# Patient Record
Sex: Male | Born: 1960 | Race: White | Hispanic: No | Marital: Married | Smoking: Never smoker
Health system: Southern US, Community
[De-identification: ages and names within clinical notes are randomized; demographics above are authoritative.]

## PROBLEM LIST (undated history)

## (undated) DIAGNOSIS — G4733 Obstructive sleep apnea (adult) (pediatric): Secondary | ICD-10-CM

## (undated) DIAGNOSIS — K219 Gastro-esophageal reflux disease without esophagitis: Secondary | ICD-10-CM

## (undated) DIAGNOSIS — N200 Calculus of kidney: Secondary | ICD-10-CM

## (undated) DIAGNOSIS — G459 Transient cerebral ischemic attack, unspecified: Secondary | ICD-10-CM

## (undated) DIAGNOSIS — B360 Pityriasis versicolor: Secondary | ICD-10-CM

## (undated) DIAGNOSIS — I10 Essential (primary) hypertension: Secondary | ICD-10-CM

## (undated) DIAGNOSIS — I639 Cerebral infarction, unspecified: Secondary | ICD-10-CM

## (undated) HISTORY — PX: CYSTECTOMY: SUR359

## (undated) HISTORY — DX: Transient cerebral ischemic attack, unspecified: G45.9

## (undated) HISTORY — DX: Cerebral infarction, unspecified: I63.9

## (undated) HISTORY — DX: Pityriasis versicolor: B36.0

## (undated) HISTORY — PX: COSMETIC SURGERY: SHX468

## (undated) HISTORY — PX: REFRACTIVE SURGERY: SHX103

## (undated) HISTORY — PX: WISDOM TOOTH EXTRACTION: SHX21

## (undated) HISTORY — PX: OTHER SURGICAL HISTORY: SHX169

## (undated) HISTORY — DX: Essential (primary) hypertension: I10

---

## 1984-08-02 ENCOUNTER — Emergency Department: Admit: 1984-08-02 | Disposition: A | Discharge status: Home / Self Care | Payer: Self-pay | Source: Ambulatory Visit

## 1987-03-04 ENCOUNTER — Emergency Department: Admit: 1987-03-04 | Payer: Self-pay | Source: Ambulatory Visit

## 1987-09-06 HISTORY — PX: COLONOSCOPY: SHX174

## 1987-09-06 HISTORY — PX: UPPER GI ENDOSCOPY: SHX6162

## 1987-10-17 ENCOUNTER — Emergency Department: Admit: 1987-10-17 | Payer: Self-pay | Source: Ambulatory Visit

## 1990-07-04 ENCOUNTER — Emergency Department: Admit: 1990-07-04 | Disposition: A | Discharge status: Home / Self Care | Payer: Self-pay | Source: Ambulatory Visit

## 1995-01-09 ENCOUNTER — Ambulatory Visit: Admit: 1995-01-09 | Disposition: A | Discharge status: Home / Self Care | Payer: Self-pay | Source: Ambulatory Visit

## 1995-08-28 ENCOUNTER — Emergency Department: Admit: 1995-08-28 | Disposition: A | Discharge status: Home / Self Care | Payer: Self-pay | Source: Ambulatory Visit

## 1998-10-14 ENCOUNTER — Ambulatory Visit (INDEPENDENT_AMBULATORY_CARE_PROVIDER_SITE_OTHER): Admit: 1998-10-14 | Disposition: A | Discharge status: Home / Self Care | Payer: Self-pay | Source: Ambulatory Visit

## 1999-07-04 ENCOUNTER — Emergency Department: Admit: 1999-07-04 | Disposition: A | Discharge status: Home / Self Care | Payer: Self-pay | Source: Ambulatory Visit

## 2001-12-07 ENCOUNTER — Ambulatory Visit (INDEPENDENT_AMBULATORY_CARE_PROVIDER_SITE_OTHER): Admit: 2001-12-07 | Disposition: A | Discharge status: Home / Self Care | Payer: Self-pay | Source: Ambulatory Visit

## 2002-02-11 ENCOUNTER — Ambulatory Visit (INDEPENDENT_AMBULATORY_CARE_PROVIDER_SITE_OTHER): Admit: 2002-02-11 | Disposition: A | Discharge status: Home / Self Care | Payer: Self-pay | Source: Ambulatory Visit

## 2002-12-16 ENCOUNTER — Ambulatory Visit (INDEPENDENT_AMBULATORY_CARE_PROVIDER_SITE_OTHER): Admit: 2002-12-16 | Disposition: A | Discharge status: Home / Self Care | Payer: Self-pay | Source: Ambulatory Visit

## 2004-11-23 ENCOUNTER — Ambulatory Visit: Payer: Self-pay | Admitting: Internal Medicine

## 2004-11-30 ENCOUNTER — Ambulatory Visit: Payer: Self-pay | Admitting: Internal Medicine

## 2005-09-19 ENCOUNTER — Ambulatory Visit: Payer: Self-pay | Admitting: Internal Medicine

## 2005-09-21 ENCOUNTER — Ambulatory Visit
Admission: RE | Admit: 2005-09-21 | Disposition: A | Discharge status: Home / Self Care | Payer: Self-pay | Source: Ambulatory Visit

## 2005-10-11 ENCOUNTER — Ambulatory Visit
Admission: RE | Admit: 2005-10-11 | Disposition: A | Discharge status: Home / Self Care | Payer: Self-pay | Source: Ambulatory Visit

## 2006-05-28 ENCOUNTER — Emergency Department
Admission: EM | Admit: 2006-05-28 | Disposition: A | Discharge status: Home / Self Care | Payer: Self-pay | Source: Ambulatory Visit

## 2006-07-03 ENCOUNTER — Ambulatory Visit
Admission: RE | Admit: 2006-07-03 | Disposition: A | Discharge status: Home / Self Care | Payer: Self-pay | Source: Ambulatory Visit

## 2006-07-25 ENCOUNTER — Ambulatory Visit
Admission: RE | Admit: 2006-07-25 | Disposition: A | Discharge status: Home / Self Care | Payer: Self-pay | Source: Ambulatory Visit

## 2007-03-13 ENCOUNTER — Ambulatory Visit: Payer: Self-pay | Admitting: Internal Medicine

## 2007-09-06 DIAGNOSIS — I639 Cerebral infarction, unspecified: Secondary | ICD-10-CM

## 2007-09-06 HISTORY — DX: Cerebral infarction, unspecified: I63.9

## 2007-10-10 ENCOUNTER — Ambulatory Visit: Payer: Self-pay | Admitting: Internal Medicine

## 2007-10-10 DIAGNOSIS — K209 Esophagitis, unspecified without bleeding: Secondary | ICD-10-CM | POA: Insufficient documentation

## 2007-10-10 DIAGNOSIS — R1319 Other dysphagia: Secondary | ICD-10-CM | POA: Insufficient documentation

## 2007-10-10 LAB — CONVERTED CEMR LAB
Bilirubin Urine: NEGATIVE
Blood in Urine, dipstick: NEGATIVE
Glucose, Urine, Semiquant: NEGATIVE
Ketones, urine, test strip: NEGATIVE
Protein, U semiquant: NEGATIVE
Specific Gravity, Urine: 1.01
WBC Urine, dipstick: NEGATIVE
pH: 7

## 2007-10-19 ENCOUNTER — Encounter (INDEPENDENT_AMBULATORY_CARE_PROVIDER_SITE_OTHER): Payer: Self-pay | Admitting: *Deleted

## 2007-10-19 LAB — CONVERTED CEMR LAB
AST: 30 units/L (ref 0–37)
Albumin: 4.1 g/dL (ref 3.5–5.2)
Alkaline Phosphatase: 53 units/L (ref 39–117)
Basophils Relative: 0.2 % (ref 0.0–1.0)
Eosinophils Relative: 2.8 % (ref 0.0–5.0)
H Pylori IgG: NEGATIVE
Hemoglobin: 16.9 g/dL (ref 13.0–17.0)
Lymphocytes Relative: 26.3 % (ref 12.0–46.0)
Monocytes Absolute: 0.7 10*3/uL (ref 0.2–0.7)
Monocytes Relative: 9.5 % (ref 3.0–11.0)
Neutro Abs: 4.4 10*3/uL (ref 1.4–7.7)
RDW: 11.9 % (ref 11.5–14.6)
Total Bilirubin: 0.8 mg/dL (ref 0.3–1.2)
Total Protein: 7.3 g/dL (ref 6.0–8.3)
WBC: 7.2 10*3/uL (ref 4.5–10.5)

## 2007-11-16 ENCOUNTER — Inpatient Hospital Stay (HOSPITAL_COMMUNITY): Admission: EM | Admit: 2007-11-16 | Discharge: 2007-11-17 | Payer: Self-pay | Admitting: Emergency Medicine

## 2007-11-16 ENCOUNTER — Encounter (INDEPENDENT_AMBULATORY_CARE_PROVIDER_SITE_OTHER): Payer: Self-pay | Admitting: Neurology

## 2007-11-16 ENCOUNTER — Ambulatory Visit: Payer: Self-pay | Admitting: Internal Medicine

## 2007-11-20 ENCOUNTER — Telehealth: Payer: Self-pay | Admitting: Internal Medicine

## 2007-12-25 ENCOUNTER — Encounter: Payer: Self-pay | Admitting: Internal Medicine

## 2008-01-29 ENCOUNTER — Telehealth (INDEPENDENT_AMBULATORY_CARE_PROVIDER_SITE_OTHER): Payer: Self-pay | Admitting: *Deleted

## 2008-12-23 ENCOUNTER — Ambulatory Visit: Payer: Self-pay | Admitting: Internal Medicine

## 2008-12-23 DIAGNOSIS — Z8673 Personal history of transient ischemic attack (TIA), and cerebral infarction without residual deficits: Secondary | ICD-10-CM | POA: Insufficient documentation

## 2008-12-25 ENCOUNTER — Telehealth: Payer: Self-pay | Admitting: Internal Medicine

## 2009-01-29 ENCOUNTER — Telehealth: Payer: Self-pay | Admitting: Internal Medicine

## 2009-02-17 ENCOUNTER — Ambulatory Visit: Admit: 2009-02-17 | Disposition: A | Discharge status: Home / Self Care | Payer: Self-pay | Source: Ambulatory Visit

## 2009-02-17 ENCOUNTER — Ambulatory Visit
Admission: RE | Admit: 2009-02-17 | Disposition: A | Discharge status: Home / Self Care | Payer: Self-pay | Source: Ambulatory Visit

## 2009-07-13 ENCOUNTER — Ambulatory Visit: Payer: Self-pay | Admitting: Internal Medicine

## 2009-07-13 DIAGNOSIS — F411 Generalized anxiety disorder: Secondary | ICD-10-CM | POA: Insufficient documentation

## 2009-07-17 ENCOUNTER — Encounter (INDEPENDENT_AMBULATORY_CARE_PROVIDER_SITE_OTHER): Payer: Self-pay | Admitting: *Deleted

## 2009-07-20 ENCOUNTER — Ambulatory Visit: Payer: Self-pay | Admitting: Internal Medicine

## 2009-08-13 ENCOUNTER — Ambulatory Visit: Payer: Self-pay | Admitting: Internal Medicine

## 2009-09-11 ENCOUNTER — Ambulatory Visit: Payer: Self-pay | Admitting: Gastroenterology

## 2009-09-17 ENCOUNTER — Telehealth (INDEPENDENT_AMBULATORY_CARE_PROVIDER_SITE_OTHER): Payer: Self-pay | Admitting: *Deleted

## 2009-10-02 ENCOUNTER — Ambulatory Visit: Payer: Self-pay | Admitting: Internal Medicine

## 2009-10-26 ENCOUNTER — Telehealth: Payer: Self-pay | Admitting: Internal Medicine

## 2009-11-23 ENCOUNTER — Telehealth: Payer: Self-pay | Admitting: Internal Medicine

## 2009-11-26 ENCOUNTER — Ambulatory Visit: Payer: Self-pay | Admitting: Internal Medicine

## 2009-11-26 DIAGNOSIS — E1159 Type 2 diabetes mellitus with other circulatory complications: Secondary | ICD-10-CM

## 2009-11-26 DIAGNOSIS — I10 Essential (primary) hypertension: Secondary | ICD-10-CM

## 2009-11-26 DIAGNOSIS — I152 Hypertension secondary to endocrine disorders: Secondary | ICD-10-CM | POA: Insufficient documentation

## 2009-11-27 ENCOUNTER — Encounter (INDEPENDENT_AMBULATORY_CARE_PROVIDER_SITE_OTHER): Payer: Self-pay | Admitting: *Deleted

## 2009-11-30 LAB — CONVERTED CEMR LAB
BUN: 12 mg/dL (ref 6–23)
Creatinine, Ser: 0.9 mg/dL (ref 0.4–1.5)
Potassium: 4 meq/L (ref 3.5–5.1)

## 2009-12-21 ENCOUNTER — Ambulatory Visit: Payer: Self-pay

## 2009-12-21 ENCOUNTER — Ambulatory Visit: Payer: Self-pay | Admitting: Cardiology

## 2009-12-31 ENCOUNTER — Ambulatory Visit: Payer: Self-pay | Admitting: Family Medicine

## 2009-12-31 DIAGNOSIS — B360 Pityriasis versicolor: Secondary | ICD-10-CM | POA: Insufficient documentation

## 2010-01-15 ENCOUNTER — Ambulatory Visit
Admission: RE | Admit: 2010-01-15 | Disposition: A | Discharge status: Home / Self Care | Payer: Self-pay | Source: Ambulatory Visit

## 2010-01-26 ENCOUNTER — Telehealth (INDEPENDENT_AMBULATORY_CARE_PROVIDER_SITE_OTHER): Payer: Self-pay | Admitting: *Deleted

## 2010-05-03 ENCOUNTER — Telehealth (INDEPENDENT_AMBULATORY_CARE_PROVIDER_SITE_OTHER): Payer: Self-pay | Admitting: *Deleted

## 2010-06-21 ENCOUNTER — Ambulatory Visit: Payer: Self-pay | Admitting: Internal Medicine

## 2010-06-21 DIAGNOSIS — R0989 Other specified symptoms and signs involving the circulatory and respiratory systems: Secondary | ICD-10-CM

## 2010-06-21 DIAGNOSIS — R0609 Other forms of dyspnea: Secondary | ICD-10-CM | POA: Insufficient documentation

## 2010-08-11 ENCOUNTER — Telehealth: Payer: Self-pay | Admitting: Internal Medicine

## 2010-10-03 LAB — CONVERTED CEMR LAB
ALT: 42 units/L (ref 0–53)
AST: 32 units/L (ref 0–37)
BUN: 10 mg/dL (ref 6–23)
Sed Rate: 4 mm/hr (ref 0–22)
TSH: 3.17 microintl units/mL (ref 0.35–5.50)

## 2010-10-05 NOTE — Progress Notes (Signed)
Summary: MED REFILL  Phone Note Call from Patient Call back at Work Phone [phone]   Caller: Patient Call For: Marga Melnick MD Reason for Call: Refill Medication Summary of Call: PATIENT REQUESTING REFILL OF FLUOXETINE 10MG , 30 DAY SUPPLY, BUT WOULD LIKE 90 SUPPLY IF POSSIBLE.  CURRENTLY NO REFILLS LEFT.  [facility] [address].  LAST OV 10-02-2009, I DO NOT SEE ANY DOCUMENTATION OF THIS MEDICATION IN EMR.  PT STATES DR. Devontre Siedschlag PRESCRIBED THIS MED. Initial call taken by: Magdalen Spatz [facility],  October 26, 2009 2:42 PM  Follow-up for Phone Call        Dr.Mozell Hardacre please advise, not on medication list Follow-up by: Shonna Chock,  October 26, 2009 3:13 PM  Additional Follow-up for Phone Call Additional follow up Details #1::        Fluoxetine 10 mg #90 Additional Follow-up by: Marga Melnick MD,  October 26, 2009 4:08 PM    Additional Follow-up for Phone Call Additional follow up Details #2::    left pt detail message rx sent to pharmacy...........Marland KitchenFelecia Deloach CMA  October 26, 2009 4:23 PM   New/Updated Medications: FLUOXETINE HCL 10 MG TABS (FLUOXETINE HCL) take 1 tab once daily Prescriptions: FLUOXETINE HCL 10 MG TABS (FLUOXETINE HCL) take 1 tab once daily  #90 x 0   Entered by:   Jeremy Johann CMA   Authorized by:   Marga Melnick MD   Signed by:   Jeremy Johann CMA on 10/26/2009   Method used:   Faxed to ...       [facility]       [address]       [place], [place]  [address]       Ph: [phone]       Fax: [phone]   RxID:   [id number]

## 2010-10-05 NOTE — Assessment & Plan Note (Signed)
Summary: ASTHMA/ ALLERGIES--AFFECTING HIS BREATHING--CAN TELL DIFF///SPH   Vital Signs:  Patient profile:   50 year old male Weight:      244.8 pounds BMI:     31.33 O2 Sat:      98 % on Room air Temp:     98.1 degrees F oral Pulse rate:   96 / minute Resp:     15 per minute BP sitting:   126 / 88  (left arm) Cuff size:   large  Vitals Entered By: Shonna Chock CMA (June 21, 2010 3:29 PM)  O2 Flow:  Room air CC: SOB with certain movements when working out with trainer x couple months, COPD follow-up   CC:  SOB with certain movements when working out with trainer x couple months and COPD follow-up.  History of Present Illness:      This is a 50 year old man who presents for  exertional  dyspnea  for several months."I feel like I'm hyperventilating".No perioral numbness with episodes. He denies PMH of asthma He had similar symptoms in 1999 , Serevent was Rxed w/o benefit then &  PFTs were WNL. The patient reports  some wheezing and exercise induced symptoms, but denies chest tightness, cough, increased sputum, or  nocturnal awakening.  The patient reports limitation only  with certain  strenuous activities such as throwing up medicine ball , lifting weights off floor & abdominal crunches; but  not with rowing , elliptical or treadmill. 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  No current treatment; Serevent is 10+ years out of date.    Current Medications (verified): 1)  Aspirin 325 Mg Tabs (Aspirin) .Marland Kitchen.. 1 By Mouth Once Daily 2)  Fiber Diet  Tabs (Fiber) .... Take 1 Tablet By Mouth Once A Day 3)  Fluoxetine Hcl 10 Mg Tabs (Fluoxetine Hcl) .... Take 1 Tab Once Daily  Allergies (verified): No Known Drug Allergies  Review of Systems CV:  Complains of swelling of feet; denies bluish discoloration of lips or nails, leg cramps with exertion, and swelling of hands. Resp:  Denies chest pain with  inspiration and coughing up blood. Allergy:  Denies itching eyes and sneezing.  Physical Exam  General:  well-nourished,in no acute distress; alert,appropriate and cooperative throughout examination Mouth:  Oral mucosa and oropharynx without lesions or exudates.  Teeth in good repair. Oropharynx not crowded Neck:  No deformities, masses, or tenderness noted. Lungs:  Normal respiratory effort, chest expands symmetrically. Lungs are clear to auscultation, no crackles or wheezes. Heart:  Normal rate and regular rhythm. S1 and S2 normal without gallop, murmur, click, rub.S4 Abdomen:  Bowel sounds positive,abdomen soft and non-tender without masses, organomegaly or hernias noted. Pulses:  R and L radial,femoral,dorsalis pedis and posterior tibial pulses are full and equal bilaterally Extremities:  No clubbing, cyanosis  or deformity noted. Trace edema. Negative Homan's Neurologic:  alert & oriented X3.   Skin:  Intact without suspicious lesions or rashes Cervical Nodes:  No lymphadenopathy noted Axillary Nodes:  No palpable lymphadenopathy Psych:  memory intact for recent and remote, normally interactive, and good eye contact.     Impression & Recommendations:  Problem # 1:  DYSPNEA/SHORTNESS OF BREATH (ICD-786.09)  EIB  variant vs Hiatal Hernia induced symptoms  Orders: T-2 View CXR (71020TC)  His updated medication list for this problem includes:    Proventil Hfa 108 (90 Base) Mcg/act Aers (Albuterol sulfate) .[facility] Kitchen[facility] KitchenMar[facility] KitchenMar[facility] Kitchen  1-2 puffs 15-30 minutes pre exercise  Complete Medication List: 1)  Aspirin 325 Mg Tabs (Aspirin) .Marland Kitchen.. 1 by mouth once daily 2)  Fiber Diet Tabs (Fiber) .... Take 1 tablet by mouth once a day 3)  Fluoxetine Hcl 10 Mg Tabs (Fluoxetine hcl) .... Take 1 tab once daily 4)  Proventil Hfa 108 (90 Base) Mcg/act Aers (Albuterol sulfate) .Marland Kitchen.. 1-2 puffs 15-30 minutes pre exercise  Patient Instructions: 1)  Respiratory Stress Test if symptoms persist or progress.Review report with  Trainer to determine any specific execise triggers. Prescriptions: PROVENTIL HFA 108 (90 BASE) MCG/ACT AERS (ALBUTEROL SULFATE) 1-2 puffs 15-30 minutes pre exercise  #1 x 2   Entered and Authorized by:   Marga Melnick MD   Signed by:   Marga Melnick MD on 06/21/2010   Method used:   Samples Given   RxID:   [id number]    Orders Added: 1)  Est. Patient Level IV [14782] 2)  T-2 View CXR [71020TC]

## 2010-10-05 NOTE — Letter (Signed)
Summary: [facility] Instructions  [facility]  [address] [place], [place] [address]   Phone: [phone]  Fax: [phone]       Nathan Kelley    20-May-1961    MRN: [id number]        Procedure Day /Date:09/25/09     Arrival Time:1230 pm     Procedure Time:130 pm     Location of Procedure:                    X  [facility] (4th Floor)                        PREPARATION FOR COLONOSCOPY WITH MOVIPREP   Starting 5 days prior to your procedure 1/16/11do not eat nuts, seeds, popcorn, corn, beans, peas,  salads, or any raw vegetables.  Do not take any fiber supplements (e.g. Metamucil, Citrucel, and Benefiber).  THE DAY BEFORE YOUR PROCEDURE         DATE: 09/24/09  DAY: THURS  1.  Drink clear liquids the entire day-NO SOLID FOOD  2.  Do not drink anything colored red or purple.  Avoid juices with pulp.  No orange juice.  3.  Drink at least 64 oz. (8 glasses) of fluid/clear liquids during the day to prevent dehydration and help the prep work efficiently.  CLEAR LIQUIDS INCLUDE: Water Jello Ice Popsicles Tea (sugar ok, no milk/cream) Powdered fruit flavored drinks Coffee (sugar ok, no milk/cream) Gatorade Juice: apple, white grape, white cranberry  Lemonade Clear bullion, consomm, broth Carbonated beverages (any kind) Strained chicken noodle soup Hard Candy                             4.  In the morning, mix first dose of MoviPrep solution:    Empty 1 Pouch A and 1 Pouch B into the disposable container    Add lukewarm drinking water to the top line of the container. Mix to dissolve    Refrigerate (mixed solution should be used within 24 hrs)  5.  Begin drinking the prep at 5:00 p.m. The MoviPrep container is divided by 4 marks.   Every 15 minutes drink the solution down to the next mark (approximately 8 oz) until the full liter is complete.   6.  Follow completed prep with 16 oz of clear liquid of your choice (Nothing red or purple).   Continue to drink clear liquids until bedtime.  7.  Before going to bed, mix second dose of MoviPrep solution:    Empty 1 Pouch A and 1 Pouch B into the disposable container    Add lukewarm drinking water to the top line of the container. Mix to dissolve    Refrigerate  THE DAY OF YOUR PROCEDURE      DATE: 09/25/09 DAY:FRI  Beginning at 830 a.m. (5 hours before procedure):         1. Every 15 minutes, drink the solution down to the next mark (approx 8 oz) until the full liter is complete.  2. Follow completed prep with 16 oz. of clear liquid of your choice.    3. You may drink clear liquids until 1130 am(2 HOURS BEFORE PROCEDURE).   MEDICATION INSTRUCTIONS  Unless otherwise instructed, you should take regular prescription medications with a small sip of water   as early as possible the morning of your procedure.  Diabetic patients - see separate  instructions.           OTHER INSTRUCTIONS  You will need a responsible adult at least 52 years of age to accompany you and drive you home.   This person must remain in the waiting room during your procedure.  Wear loose fitting clothing that is easily removed.  Leave jewelry and other valuables at home.  However, you may wish to bring a book to read or  an iPod/MP3 player to listen to music as you wait for your procedure to start.  Remove all body piercing jewelry and leave at home.  Total time from sign-in until discharge is approximately 2-3 hours.  You should go home directly after your procedure and rest.  You can resume normal activities the  day after your procedure.  The day of your procedure you should not:   Drive   Make legal decisions   Operate machinery   Drink alcohol   Return to work  You will receive specific instructions about eating, activities and medications before you leave.    The above instructions have been reviewed and explained to me by   _______________________    I fully  understand and can verbalize these instructions _____________________________ Date _________

## 2010-10-05 NOTE — Progress Notes (Signed)
Summary: REFILL REQUEST  Phone Note Refill Request Call back at Work Phone [phone] Message from:  Patient on May 03, 2010 9:42 AM  Refills Requested: Medication #1:  FLUOXETINE HCL 10 MG TABS take 1 tab once daily   Dosage confirmed as above?Dosage Confirmed   Supply Requested: 3 months   Last Refilled: 01/27/2010 [facility] [address]  Next Appointment Scheduled: NONE Initial call taken by: Lavell Islam,  May 03, 2010 9:43 AM    Prescriptions: FLUOXETINE HCL 10 MG TABS (FLUOXETINE HCL) take 1 tab once daily  #90 x 2   Entered by:   Shonna Chock CMA   Authorized by:   Marga Melnick MD   Signed by:   Shonna Chock CMA on 05/03/2010   Method used:   Electronically to        [facility] (retail)       [address]       [place], [place]  [address]       Ph: [phone]       Fax: [phone]   RxID:   [id number]

## 2010-10-05 NOTE — Progress Notes (Signed)
Summary: verify med strength  Phone Note Call from Patient Call back at Work Phone [phone]   Caller: Patient Summary of Call: Pt left VM that dr hopper increase ASA regimen and he has recent ran out and is unsure of the mg he needs to take please given him a call to verify strength. informed pt that per our records he need ASA 325mg . pt ok................Marland KitchenFelecia Deloach CMA  September 17, 2009 9:53 AM

## 2010-10-05 NOTE — Progress Notes (Signed)
Summary: bp elevate  Phone Note Call from Patient   Caller: Patient Summary of Call: PT STATES THAT AFTER HE FINISHED DOING CARDIO EXERCISE HE BEGAN TO GET DIZZY SO AMBULANCE WAS CALLED. WHEN THEY ARRIVED PT BP WAS INITIALLY 190/100 AND AFTER SEVERAL TIMES OF CHECK PT BP DID DROP TO 154/88. PT  BS WAS 121. PT HAD NOT EATING PRIOR TO WORKING OUT BUT DID DRINK A 5 HOUR ENERGY DRINK. PT ALSO HAD A EKG WHICH LOOKED FINE.  AFTER EVERYTHING PT DID EAT AND HE FEELS MUCH BETTER NOW. pt denies any dizziness, sob, chest pain, numbness or tingling.PT NOT CURRENTLY ON BP MED, LAST OV 10-02-09 BP WAS 118/82. pt has pending OV for thursday morning. PLS ADVISE...................Marland KitchenFelecia Deloach CMA  November 23, 2009 5:14 PM   Follow-up for Phone Call        per dr hopper pt to monitor BP and if symptoms worsen or recur pt needs to be seen in ED.pt ok............Marland KitchenFelecia Deloach CMA  November 23, 2009 5:31 PM

## 2010-10-05 NOTE — Assessment & Plan Note (Signed)
Summary: ELEVATED BP//FD   Vital Signs:  Patient profile:   50 year old male Weight:      246.2 pounds BMI:     31.51 Temp:     98.0 degrees F oral Pulse rate:   64 / minute Resp:     15 per minute BP sitting:   150 / 92  (left arm) Cuff size:   large  Vitals Entered By: Shonna Chock (November 26, 2009 10:52 AM) CC: Elevated B/P concerns Comments REVIEWED MED LIST, PATIENT AGREED DOSE AND INSTRUCTION CORRECT    CC:  Elevated B/P concerns.  History of Present Illness: Events of 11/23/2009 reviewed & verified. Additional history : he drank 7 bottles of wateras he felt dehydrated after treadmill  & before BP reading of 190/100. No recurrence of dizziness but he has only mowed lawn 03/23. BP 03/22 was 140/78 @ [facility]. No PMH of HTN ,but  TIA in 2009. He wants to avoid BP meds.He denies excess salt intake.  Allergies (verified): No Known Drug Allergies  Review of Systems General:  Complains of fatigue and sleep disorder; Restless sleep due to ?, no better with sleeping pill. Eyes:  Denies blurring, double vision, and vision loss-both eyes. CV:  Denies chest pain or discomfort, difficulty breathing at night, difficulty breathing while lying down, fainting, leg cramps with exertion, lightheadness, near fainting, palpitations, shortness of breath with exertion, swelling of feet, and swelling of hands. Neuro:  Denies brief paralysis, difficulty with concentration, disturbances in coordination, falling down, headaches, memory loss, numbness, poor balance, sensation of room spinning, tingling, visual disturbances, and weakness; Some tightness in neck. No BPV. Endo:  Complains of weight change; Weight varies 237-241; he wants to loose weight.  Physical Exam  General:  well-nourished; alert,appropriate and cooperative throughout examination Eyes:  No corneal or conjunctival inflammation noted. EOMI. Perrla. Field of  Vision grossly normal. Mouth:  Oral mucosa and oropharynx without lesions or  exudates.  Teeth in good repair. Tongue w/o deviation Lungs:  Normal respiratory effort, chest expands symmetrically. Lungs are clear to auscultation, no crackles or wheezes. Heart:  Normal rate and regular rhythm. S1 and S2 normal without gallop, murmur, click, rub or other extra sounds. Abdomen:  Bowel sounds positive,abdomen soft and non-tender without masses, organomegaly or hernias noted. No AAA or bruits Pulses:  R and L carotid,radial,dorsalis pedis and posterior tibial pulses are full and equal bilaterally Extremities:  No clubbing, cyanosis, edema. Neurologic:  alert & oriented X3, cranial nerves II-XII intact, strength normal in all extremities, sensation intact to light touch, gait normal, DTRs symmetrical and normal, finger-to-nose normal, and Romberg negative.   Skin:  Intact without suspicious lesions or rashes Psych:  memory intact for recent and remote, normally interactive, and good eye contact.     Impression & Recommendations:  Problem # 1:  ELEVATED BLOOD PRESSURE WITHOUT DIAGNOSIS OF HYPERTENSION (ICD-796.2)  Orders: Venipuncture (54098) TLB-Creatinine, Blood (82565-CREA) TLB-Potassium (K+) (84132-K) TLB-BUN (Urea Nitrogen) (84520-BUN) Misc. Referral (Misc. Ref)  Problem # 2:  TRANSIENT ISCHEMIC ATTACK, HX OF (ICD-V12.50)  Complete Medication List: 1)  Aspirin 325 Mg Tabs (Aspirin) .Marland Kitchen.. 1 by mouth once daily 2)  Fiber Diet Tabs (Fiber) .... Take 1 tablet by mouth once a day 3)  Fluoxetine Hcl 10 Mg Tabs (Fluoxetine hcl) .... Take 1 tab once daily  Patient Instructions: 1)  Check your Blood Pressure regularly. If it is above: 135/85 ON AVERAGE  you should make an appointment. Consume LESS THAN 40 grams of "sugar" /day  from foods & drinks with High Fructose Corn Syrup as #1,2 or #3 on label.

## 2010-10-05 NOTE — Progress Notes (Signed)
Summary: Refill--Fluoxetine  Phone Note Refill Request Call back at Work Phone [phone] Message from:  Patient on August 11, 2010 9:56 AM  Refills Requested: Medication #1:  FLUOXETINE HCL 10 MG TABS take 1 tab once daily Initial call taken by: Lucious Groves CMA,  August 11, 2010 9:56 AM    Prescriptions: FLUOXETINE HCL 10 MG TABS (FLUOXETINE HCL) take 1 tab once daily  #90 x 2   Entered by:   Lucious Groves CMA   Authorized by:   Marga Melnick MD   Signed by:   Lucious Groves CMA on 08/11/2010   Method used:   Electronically to        [facility] (retail)       [address]       [place], [place]  [address]       Ph: [phone]       Fax: [phone]   RxID:   [id number]

## 2010-10-05 NOTE — Progress Notes (Signed)
Summary: Refill Request  Phone Note Refill Request Message from:  Patient on Jan 26, 2010 2:00 PM  Refills Requested: Medication #1:  FLUOXETINE HCL 10 MG TABS take 1 tab once daily   Dosage confirmed as above?Dosage Confirmed   Supply Requested: 3 months [facility] [address]   Next Appointment Scheduled: none Initial call taken by: Harold Barban,  Jan 26, 2010 2:00 PM    Prescriptions: FLUOXETINE HCL 10 MG TABS (FLUOXETINE HCL) take 1 tab once daily  #90 x 0   Entered by:   Jeremy Johann CMA   Authorized by:   Marga Melnick MD   Signed by:   Jeremy Johann CMA on 01/27/2010   Method used:   Faxed to ...       [facility]       [address]       [place], [place]  [address]       Ph: [phone]       Fax: [phone]   RxID:   [id number]

## 2010-10-05 NOTE — Assessment & Plan Note (Signed)
Summary: rash on back,abdomen//fd   Vital Signs:  Patient profile:   50 year old male Height:      74.25 inches Weight:      246.13 pounds Temp:     97.1 degrees F oral Pulse rate:   60 / minute BP sitting:   120 / 60  Vitals Entered By: Kandice Hams (December 31, 2009 1:38 PM) CC: c/o rash all over trunk and ack and groin area   History of Present Illness: 50 yo man here today w/ rash.  pt reports hx of similar- was told it was 'yeast'.  'i only get it in the summer.  dad gets it, sister gets it'.  more noticeable w/ sweat, exercise.  itchy.  selenium sulfide works periodically but 'the medicine cleared it up'.  Allergies (verified): No Known Drug Allergies  Review of Systems      See HPI  Physical Exam  General:  well-nourished; alert,appropriate and cooperative throughout examination Skin:  tinea versicolor   Impression & Recommendations:  Problem # 1:  TINEA VERSICOLOR (ICD-111.0) Assessment Unchanged pt's sxs classic.  start oral anti-fungal. His updated medication list for this problem includes:    Ketoconazole 200 Mg Tabs (Ketoconazole) .Marland Kitchen... 1 pill x5 days  Complete Medication List: 1)  Aspirin 325 Mg Tabs (Aspirin) .Marland Kitchen.. 1 by mouth once daily 2)  Fiber Diet Tabs (Fiber) .... Take 1 tablet by mouth once a day 3)  Fluoxetine Hcl 10 Mg Tabs (Fluoxetine hcl) .... Take 1 tab once daily 4)  Ketoconazole 200 Mg Tabs (Ketoconazole) .Marland Kitchen.. 1 pill x5 days  Patient Instructions: 1)  This is Tinea Versicolor 2)  Take the pills daily as directed 3)  Take benadryl as needed for the itching 4)  Call with any questions or concerns 5)  Hang in there! Prescriptions: KETOCONAZOLE 200 MG TABS (KETOCONAZOLE) 1 pill x5 days  #5 x 0   Entered and Authorized by:   Neena Rhymes MD   Signed by:   Neena Rhymes MD on 12/31/2009   Method used:   Electronically to        [facility]  [address] [id number]ret[facility]       [address]       [place], [place]   [address]       Ph: [phone] or [phone]       Fax: [phone]   RxID:   [id number]

## 2010-10-05 NOTE — Assessment & Plan Note (Signed)
Summary: ACUTE FOR SINUS //PH   Vital Signs:  Patient profile:   50 year old male Weight:      250 pounds O2 Sat:      97 % on Room air Temp:     97.6 degrees F oral Pulse rate:   69 / minute Resp:     15 per minute BP sitting:   118 / 82  (left arm)  Vitals Entered By: Jeremy Johann CMA (October 02, 2009 12:42 PM)  O2 Flow:  Room air CC: head congestion, drainage Comments REVIEWED MED LIST, PATIENT AGREED DOSE AND INSTRUCTION CORRECT    CC:  head congestion and drainage.  History of Present Illness: Onset 09/29/2009 as temp headache , head congestion, & congestion. Rx: Advil Cold & Sinus  Allergies (verified): No Known Drug Allergies  Review of Systems General:  Denies chills, fever, and sweats. ENT:  Complains of nasal congestion and sinus pressure; Facial pain , dental pain , & purulence. Resp:  Complains of cough and sputum productive; denies shortness of breath and wheezing.  Physical Exam  General:  Well-developed,well-nourished,in no acute distress; alert,appropriate and cooperative throughout examination Ears:  External ear exam shows no significant lesions or deformities.  Otoscopic examination reveals clear canals, tympanic membranes are intact bilaterally without bulging, retraction, inflammation or discharge. Hearing is grossly normal bilaterally. Slightly dull Nose:  External nasal examination shows no deformity or inflammation. Nasal mucosa are erythematous without lesions or exudates. Hyponasal speech Mouth:  Oral mucosa and oropharynx without lesions or exudates.  Teeth in good repair. Lungs:  Normal respiratory effort, chest expands symmetrically. Lungs are clear to auscultation, no crackles or wheezes. Cervical Nodes:  No lymphadenopathy noted Axillary Nodes:  No palpable lymphadenopathy   Impression & Recommendations:  Problem # 1:  SINUSITIS- ACUTE-NOS (ICD-461.9)  His updated medication list for this problem includes:    Amoxicillin 500 Mg Caps  (Amoxicillin) .Marland Kitchen... 1 three times a day  Problem # 2:  BRONCHITIS-ACUTE (ICD-466.0)  His updated medication list for this problem includes:    Amoxicillin 500 Mg Caps (Amoxicillin) .Marland Kitchen... 1 three times a day  Complete Medication List: 1)  Aspirin 325 Mg Tabs (Aspirin) .Marland Kitchen.. 1 by mouth once daily 2)  Fiber Diet Tabs (Fiber) .... Take 1 tablet by mouth once a day 3)  Moviprep 100 Gm Solr (Peg-kcl-nacl-nasulf-na asc-c) .... As per prep instructions. 4)  Amoxicillin 500 Mg Caps (Amoxicillin) .Marland Kitchen.. 1 three times a day  Patient Instructions: 1)  Drink as much fluid as you can tolerate for the next few days. Neti pot once daily until sinuses clear. Use nasal spray two times a day as directed. Prescriptions: AMOXICILLIN 500 MG CAPS (AMOXICILLIN) 1 three times a day  #30 x 0   Entered and Authorized by:   Marga Melnick MD   Signed by:   Marga Melnick MD on 10/02/2009   Method used:   Faxed to ...       [facility]       [address]       [place], [place]  [address]       Ph: [phone]       Fax: [phone]   RxID:   [id number]
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
## 2010-10-05 NOTE — Letter (Signed)
Summary: Primary Care Consult Scheduled Letter  [facility]  [address] [place], [place] [address]   Phone: [phone]  Fax: [phone]      11/27/2009 MRN: [id number]  Nathan Kelley [address] [place], [place]  [address]    Dear Mr. Dowers,    We have scheduled an appointment for you.  At the recommendation of Dr. Marga Melnick, we have scheduled you a consult with Dr. Antoine Poche [facility] on 12-21-2009 at 2:00pm.  Their address is [address]rd floor, [place] [address]. The office phone number is [phone].  If this appointment day and time is not convenient for you, please feel free to call the office of the doctor you are being referred to at the number listed above and reschedule the appointment.     It is important for you to keep your scheduled appointments. We are here to make sure you are given good patient care.   Thank you,    Renee, Patient Care Coordinator [facility]

## 2011-01-18 NOTE — Discharge Summary (Signed)
NAME:  Nathan, Kelley NO.:  [id number]   MEDICAL RECORD NO.:  [id number]          PATIENT TYPE:  INP   LOCATION:  3010                         FACILITY:  [facility]   PHYSICIAN:  Marlan Palau, M.D.  DATE OF BIRTH:  07/29/1961   DATE OF ADMISSION:  11/16/2007  DATE OF DISCHARGE:  11/17/2007                               DISCHARGE SUMMARY   ADMISSION DIAGNOSIS:  New onset of dizziness with possible syncope.   DISCHARGE DIAGNOSES:  1. 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  2. History of vertigo in the past.   PROCEDURE DURING THIS ADMISSION:  1. CT of the head.  2. MRI of the brain.  3. MR angiogram of the intracranial and extracranial vessels.  4. 2D echocardiogram.  5. Transcranial Doppler study.   COMPLICATIONS OF ABOVE PROCEDURES:  None.   HISTORY OF PRESENT ILLNESS:  Nathan Kelley is a 50 year old right-handed  white male who born on 1961/01/12, with a history of episodes of  vertigo in the past.  The patient, however, is in relatively good health  otherwise, he is a Licensed conveyancer.  The patient presented to [facility] with a sudden onset of dizziness.  The patient had nausea,  vomiting, may have blacked out when he stood up.  The patient began  having resolution of symptoms and transported to the emergency room.  The patient underwent a CT scan of the head, upon evaluation in the ER  Code Stroke was called.  The patient had a NIH stroke scale score of 2.  The patient was brought in to the hospital for further evaluation of  possible stroke event.  TPA was not given due to minimal deficit and  rapid resolution of symptoms.  The patient did not report any focal  numbness or weakness on the face or on the legs, but EMS felt that the  right side was a little bit weak upon transport.  This was not seen on  neurologic examination in the emergency room.   PAST MEDICAL HISTORY:  1. 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  2. Mild obesity.  3. Bilateral ear surgery with plastic surgery as a child.  4. 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  5. History of vertigo as above.   MEDICATIONS:  The patient is on no medications prior to coming in.   ALLERGIES:  He has no known allergies.   SOCIAL HISTORY:  He does not smoke.  Drinks on average 1 glass of wine  daily.   Please refer to history and physical dictation done for social history,  family history, review of systems, and physical examination.   LABORATORY DATA:  Laboratory values are notable for white count of 9.6,  hemoglobin 16.3, hematocrit of 48.3, MCV of 91.1, and platelets of 252.  INR of 1.0.  Sodium 139, potassium 4.0, chloride of 105, CO2 24, glucose  of 109, BUN of 13, and creatinine of 1.0.  The patient  has total  bilirubin of 0.9.  Alkaline phosphatase of 54, SGOT of 35, SGPT of 35,  total protein 7.2, albumin of 4.2, calcium 9.0, and hemoglobin A1c of  5.7.  The patient has a CK level of 146, MB fraction of 3, and troponin  I 0.02.  Urine drug screen was negative.  Urinalysis reveals specific  gravity of 1.014 and pH of 7.0.  Cholesterol panel revealed a total  cholesterol level of 86, triglycerides of 75, HDL of 31, LDL of 40, and  VLDL of 15.   HOSPITAL COURSE:  This patient has done well during the course of  hospitalization.  The patient underwent a CT scan of the brain on  admission that was unremarkable.  The patient was not given tPA due to  minimal deficit and rapid improvement of symptoms.  The patient was set  up for an MRI scan of brain, which was done showing evidence of a small  nonhemorrhagic left lateral medullary infarct.  Minimal small vessel  changes were noted, otherwise.  MR angiogram of the neck showed no  evidence of carotid stenosis.  There was possible mild narrowing of the  proximal right vertebral artery.  The left vertebral artery was   dominant.  There maybe some stenosis of the proximal common carotid  artery on the left, but this potentially could represent artefact at the  take off in the chest.  The intracranial MR angiogram shows some mild  irregularity of the branch vessels of the superior cerebellar arteries  and posterior cerebral arteries.  The basilar artery was small in  caliber.  No large vessel blockages were seen.  A 2D echocardiogram was  also done, showed evidence of an ejection fraction of 60%.  Left  ventricular function appeared to be normal.  No source of cardiac  embolism was seen.  The patient did undergo transcranial Doppler study,  but the results are not known.  The patient was placed on aspirin after  coming in to hospital and has done well with this so far.  The patient  claims that a week ago had an episode while weightlifting of some  shortness of breath.  He does not recall any palpitations of the heart,  did have some burning sensations in the median chest area.  Etiology of  this was not clear.  The patient has not had recurrence of symptoms.  At  this point, the patient is fully ambulatory, can perform tandem gait,  has no ataxia of the arms or legs.  No speech disturbance or swallowing  problems.  The patient will be discharged to home taking aspirin 81 mg  daily.  The patient will follow up with [facility]  in 4 to 6 weeks.  We will check an outpatient transcranial Doppler  bubble study.  The patient will contact me if any further problems  arise.  The patient does not have a primary care physician.      Marlan Palau, M.D.  Electronically Signed     CKW/MEDQ  D:  11/17/2007  T:  11/18/2007  Job:  [id number]   cc:   [facility]

## 2011-01-18 NOTE — H&P (Signed)
NAME:  LAVANTE, TOSO NO.:  [id number]   MEDICAL RECORD NO.:  [id number]          PATIENT TYPE:  EMS   LOCATION:  MAJO                         FACILITY:  [facility]   PHYSICIAN:  Marlan Palau, M.D.  DATE OF BIRTH:  07/26/1961   DATE OF ADMISSION:  11/16/2007  DATE OF DISCHARGE:                              HISTORY & PHYSICAL   Neurology Admission Note   HISTORY OF PRESENT ILLNESS:  Nathan Kelley is a 50 year old right-handed  white male born December 07, 1960, with a history of vertigo in the  past.  The patient had really has no other significant past medical  history.  He Is on no medications prior to admission.   The patient got up this morning, initially in bed felt fine, but as soon  as he stood up out of bed, around 8:00 a.m. today, began feeling  lightheaded.  The patient claims that this is not like his vertigo.  The  patient became dazed, saw black spots in front of his eyes.  The patient  does not remember getting down to the floor, believes he may have passed  out.  He does not recall much of the transport to the hospital.  EMS  felt that there was some right-sided weakness, and called code stroke en  route.  The patient did not have headache, did not have slurred speech,  did not have loss of vision per se.  The patient did report some  blurring of vision.  The patient had some slight abdominal discomfort  denies any palpitations of the heart, chest pains, or shortness of  breath.  CT scan of the brain done through the emergency room was  unremarkable.  Patient has gradually improved with the symptoms.  NIH  stroke scale score is 2; one for decreased pinprick sensation on the  right side and one for right lower extremity drift.  The patient is not  felt to be a tPA candidate due to rapid improvement and minimal  deficits, at this point, and low clinical suspicion for stroke.   PAST MEDICAL HISTORY SIGNIFICANT FOR:  1. New onset of dizziness,  questionable right-sided symptoms.  2. Mild obesity.  3. History of prior surgery on both ears as a child.  4. Right hand/finger tumor resected in as a child.  5. History of vertigo.   MEDICATION:  The patient is now on no medications.   ALLERGIES:  No known allergies.   SOCIAL HISTORY:  Does not smoke.  Drinks wine on a regular basis, on  average one glass a night.  The patient is married, lives in the  [place], [place] area works in Airline pilot.  Patient has two  children who are alive and well.   FAMILY MEDICAL HISTORY:  Of note, father died with of an MI at age 69.  Mother is alive, has history of diabetes, hypertension.  Patient has six  brothers and sisters all in relatively good health.  One sister has  hypertension.  There is a history of cancer in several aunts and cousins  in the family on both sides.  Heart disease on the father's side of the  family.   REVIEW OF SYSTEMS:  Notable for no recent fevers or chills.  Patient has  no headache.  Denies note any double vision.  Denies any problems of  breathing; no chest pains or shortness of breath; denies any troubles  controlling the bowels or bladder.  The patient has a history of  blackouts in the past, many years ago.   PHYSICAL EXAMINATION:  VITALS:  Blood pressure 154/95, heart rate 61,  respiratory 20.  The patient is afebrile.  GENERAL:  This patient is a minimally-to-moderately obese white male who  is alert and cooperative at the time examination.  HEENT EXAMINATION:  Head is atraumatic.  Eyes - Pupils equal, round and  to light.  Disks are flat bilaterally.  NECK:  Supple.  No carotid bruits noted.  RESPIRATORY:  Examination is clear.  CARDIOVASCULAR:  Examination reveals a regular rate and rhythm.  No  obvious murmurs or rubs noted.  ABDOMEN:  Reveals positive bowel sounds.  No organomegaly or tenderness  noted.  EXTREMITIES:  Without significant edema.  NEUROLOGIC EXAMINATION:  Cranial nerves as above.   Facial symmetry is  present.  Patient reports some decreased in pinprick sensation on the  lower face on the right, as compared to the left, but full extraocular  movements are seen.  Normal speech is noted.  No aphasia is seen.  Symmetric facies are noted.  Motor testing reveals good strength in all  fours to direct testing.  No drift is seen with upper extremities.  There is some drift with the right lower extremity.  There is no drift  of the left lower extremity.  The patient reports decreased pinprick  sensation on the right arm and leg.  Vibratory sensation is symmetric.  No evidence of extinction is noted.  The patient has good finger-to-nose  finger-heel-shin bilaterally.  Gait was not tested.  Deep tendon  reflexes depressed, but symmetric.  Toes are neutral bilaterally.   LABORATORY VALUES:  Notable for an INR of 1.0, white count of 9.6,  hemoglobin 16.3, hematocrit 48.3, MCV of 91.1, platelets of 252.  Chemistry panel is pending at this time.  CT scan of the brain is as  above.   IMPRESSION:  Onset of dizziness, possible syncope, questionable right  sided deficits. Plan to transport.   This patient has minimal abnormalities on clinical examination at this  point.  NIH stroke scale score is 2.  The patient is not felt to be a  tPA candidate due to rapidly improving deficit, minimal deficit, and low  suspicion for a cerebrovascular infarction.  The patient will be  admitted for further evaluation at this point.   PLAN:  1. Admission to [facility] monitor bed.  2. MRI of the brain.  3. MRI angiogram of intracranial and extracranial vessels.  4. A 2-D echocardiogram.  5. An EEG study.  6. Aspirin therapy.   Will follow patient's clinical course while in-house.  Will get a urine  drug screen.      Marlan Palau, M.D.  Electronically Signed    CKW/MEDQ  D:  11/16/2007  T:  11/17/2007  Job:  [id number]   cc:   [address]  [address] [place]  [place]  [facility]

## 2011-02-08 ENCOUNTER — Encounter: Payer: Self-pay | Admitting: Internal Medicine

## 2011-02-08 ENCOUNTER — Ambulatory Visit (INDEPENDENT_AMBULATORY_CARE_PROVIDER_SITE_OTHER): Payer: PRIVATE HEALTH INSURANCE | Admitting: Internal Medicine

## 2011-02-08 VITALS — BP 112/80 | Temp 98.1°F | Wt 245.0 lb

## 2011-02-08 DIAGNOSIS — J209 Acute bronchitis, unspecified: Secondary | ICD-10-CM

## 2011-02-08 MED ORDER — HYDROCODONE-HOMATROPINE 5-1.5 MG/5ML PO SYRP: 5.0000 mL | ORAL_SOLUTION | Freq: Four times a day (QID) | ORAL | Status: AC | PRN

## 2011-02-08 MED ORDER — AZITHROMYCIN 250 MG PO TABS: 250.0000 mg | ORAL_TABLET | Freq: Every day | ORAL | Status: AC

## 2011-02-08 NOTE — Patient Instructions (Signed)
Plain Mucinex for thick secretions ; fiorce NON dairy fluids  For next 48 hrs
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
## 2011-04-13 ENCOUNTER — Encounter: Payer: Self-pay | Admitting: Internal Medicine

## 2011-05-17 ENCOUNTER — Other Ambulatory Visit: Payer: Self-pay

## 2011-05-17 MED ORDER — FLUOXETINE HCL 10 MG PO CAPS: 10.0000 mg | ORAL_CAPSULE | Freq: Every day | ORAL | Status: DC

## 2011-05-17 NOTE — Telephone Encounter (Signed)
Patient left message on voicemail, needs refill sent to [facility]

## 2011-05-30 LAB — COMPREHENSIVE METABOLIC PANEL
ALT: 35
Alkaline Phosphatase: 54
BUN: 13
CO2: 24
Chloride: 105
GFR calc non Af Amer: >60
Glucose, Bld: 109 — ABNORMAL HIGH
Potassium: 4
Sodium: 139
Total Bilirubin: 0.9
Total Protein: 7.2

## 2011-05-30 LAB — LIPID PANEL
Cholesterol: 86
LDL Cholesterol: 40
VLDL: 15

## 2011-05-30 LAB — DIFFERENTIAL
Basophils Absolute: 0
Basophils Relative: 0
Eosinophils Absolute: 0.2
Monocytes Relative: 9
Neutrophils Relative %: 57

## 2011-05-30 LAB — URINALYSIS, ROUTINE W REFLEX MICROSCOPIC
Bilirubin Urine: NEGATIVE
Glucose, UA: NEGATIVE
Nitrite: NEGATIVE
Specific Gravity, Urine: 1.014
pH: 7

## 2011-05-30 LAB — RAPID URINE DRUG SCREEN, HOSP PERFORMED
Cocaine: NOT DETECTED
Opiates: NOT DETECTED
Tetrahydrocannabinol: NOT DETECTED

## 2011-05-30 LAB — CBC
HCT: 48.3
Hemoglobin: 16.3
MCHC: 33.7
MCV: 91.1
RBC: 5.31
RDW: 12.5

## 2011-05-30 LAB — PROTIME-INR: INR: 1

## 2011-05-30 LAB — HEMOGLOBIN A1C: Mean Plasma Glucose: 126

## 2011-06-27 ENCOUNTER — Telehealth: Payer: Self-pay | Admitting: Internal Medicine

## 2011-06-27 NOTE — Telephone Encounter (Signed)
Left message to call office

## 2011-06-27 NOTE — Telephone Encounter (Signed)
If he is referring to yeast infection in the inguinal areas or between the toes, Nizoral 30 grams applied bid  is appropriate

## 2011-06-27 NOTE — Telephone Encounter (Signed)
Pt stated he had a rx for meds for a yeast infection, and now is requesting a refill.  He does not know the name of the drug.

## 2011-06-27 NOTE — Telephone Encounter (Signed)
Dr.Hopper please advise 

## 2011-06-29 NOTE — Telephone Encounter (Signed)
Left message to call office

## 2011-07-08 ENCOUNTER — Encounter: Payer: Self-pay | Admitting: *Deleted

## 2011-07-08 ENCOUNTER — Other Ambulatory Visit: Payer: Self-pay | Admitting: *Deleted

## 2011-07-08 NOTE — Telephone Encounter (Signed)
Call returned to patient at [phone], no answer. A detailed voice message was left informing patient per Dr Alwyn Ren instructions. Message was left for patient to return call if he would like to have the Nizoral  Cream as previously approved sent to the pharmacy.

## 2011-07-08 NOTE — Telephone Encounter (Signed)
The oral antifungal pills have increased risk of liver damage. He would need to be seen before this could be considered because of this potential risk.

## 2011-07-08 NOTE — Telephone Encounter (Signed)
Patient returned phone call stating he was given a pill in the past,and would prefer to have that prescribed. If approved he is requesting Rx sent to [facility] [address].

## 2011-07-08 NOTE — Telephone Encounter (Signed)
Left message to call office, Letter Mail after several attempts to contact Pt. 

## 2011-07-11 MED ORDER — KETOCONAZOLE 2 % EX CREA: TOPICAL_CREAM | Freq: Two times a day (BID) | CUTANEOUS | Status: DC

## 2011-07-11 NOTE — Telephone Encounter (Signed)
Call placed to patient [phone] regarding Rx, He was asked if he wanted the Rx cream sent to pharmacy; he stated that he would like the Rx sent to pharmacy.   Rx for Nizoral cream sent to pharmacy as requested per Dr Alwyn Ren ok.

## 2011-08-08 ENCOUNTER — Telehealth: Payer: Self-pay | Admitting: *Deleted

## 2011-08-08 ENCOUNTER — Ambulatory Visit: Payer: PRIVATE HEALTH INSURANCE | Admitting: Internal Medicine

## 2011-08-08 DIAGNOSIS — Z0289 Encounter for other administrative examinations: Secondary | ICD-10-CM

## 2011-08-08 NOTE — Telephone Encounter (Signed)
Office Message from Date: 08/07/2011 12:00:00 AM Time of Call: [phone] Faxed To: [facility] Fax Number: [phone] Facility: N/A Patient: Nathan, Kelley DOB: June 03, 1961 Phone: [phone] Provider: Marga Melnick Message: See info below Regarding Appointment: Yes Appt Date: 08/08/2011 Appt Time: 11:00:00 AM Provider: Marga Melnick Reason: Details: Out of town Outcome: Instructed patient to call back on the next business day.

## 2011-08-12 ENCOUNTER — Encounter: Payer: Self-pay | Admitting: Family Medicine

## 2011-08-12 ENCOUNTER — Ambulatory Visit (INDEPENDENT_AMBULATORY_CARE_PROVIDER_SITE_OTHER): Payer: PRIVATE HEALTH INSURANCE | Admitting: Family Medicine

## 2011-08-12 VITALS — BP 120/70 | HR 79 | Temp 97.8°F | Wt 246.0 lb

## 2011-08-12 DIAGNOSIS — J40 Bronchitis, not specified as acute or chronic: Secondary | ICD-10-CM

## 2011-08-12 MED ORDER — AZITHROMYCIN 250 MG PO TABS: ORAL_TABLET | ORAL | Status: AC

## 2011-08-12 NOTE — Progress Notes (Signed)
  Subjective:     Nathan Kelley is a 50 y.o. male who presents for evaluation of symptoms of a URI, possible sinusitis. Symptoms include congestion, low grade fever, nasal congestion, sinus pressure and wheezing. Onset of symptoms was 2 weeks ago, and has been gradually worsening since that time. Treatment to date: antihistamines and cough suppressants.  The following portions of the patient's history were reviewed and updated as appropriate: allergies, current medications, past family history, past medical history, past social history, past surgical history and problem list.  Review of Systems Pertinent items are noted in HPI.   Objective:    BP 120/70  Pulse 79  Temp(Src) 97.8 F (36.6 C) (Oral)  Wt 246 lb (111.585 kg)  SpO2 98% General appearance: alert, cooperative, appears stated age and no distress Ears: normal TM's and external ear canals both ears Nose: green and yellow discharge, mild congestion, sinus tenderness bilateral Throat: abnormal findings: pnd Neck: mild anterior cervical adenopathy, supple, symmetrical, trachea midline and thyroid not enlarged, symmetric, no tenderness/mass/nodules Lungs: diminished breath sounds bilaterally Heart: regular rate and rhythm, S1, S2 normal, no murmur, click, rub or gallop   Assessment:    bronchitis   Plan:    Suggested symptomatic OTC remedies. Nasal saline spray for congestion. Zithromax per orders. Nasal steroids per orders. Follow up as needed. Call in a few days if symptoms aren't resolving.

## 2011-08-12 NOTE — Patient Instructions (Signed)

## 2011-08-15 ENCOUNTER — Ambulatory Visit: Payer: PRIVATE HEALTH INSURANCE | Admitting: Internal Medicine

## 2011-09-15 ENCOUNTER — Encounter: Payer: Self-pay | Admitting: Family Medicine

## 2011-09-15 ENCOUNTER — Ambulatory Visit (INDEPENDENT_AMBULATORY_CARE_PROVIDER_SITE_OTHER): Payer: PRIVATE HEALTH INSURANCE | Admitting: Family Medicine

## 2011-09-15 VITALS — BP 120/76 | HR 74 | Temp 97.8°F | Wt 247.4 lb

## 2011-09-15 DIAGNOSIS — R42 Dizziness and giddiness: Secondary | ICD-10-CM

## 2011-09-15 LAB — POCT URINALYSIS DIPSTICK
Glucose, UA: NEGATIVE
Nitrite, UA: NEGATIVE
Protein, UA: NEGATIVE
Urobilinogen, UA: 0.2

## 2011-09-15 MED ORDER — MECLIZINE HCL 25 MG PO TABS: 25.0000 mg | ORAL_TABLET | Freq: Three times a day (TID) | ORAL | Status: AC | PRN

## 2011-09-15 NOTE — Progress Notes (Signed)
  Subjective:     Nathan Kelley is a 51 y.o. male who presents for evaluation of dizziness. The symptoms started 2 days ago and are worse. The attacks occur daily and last 6 hours. Positions that worsen symptoms: bending over. Previous workup/treatments: blood work: 2010 with TIA, CT scan of brain and MRI of the brain. Associated ear symptoms: none. Associated CNS symptoms: drowsiness. Recent infections: none. Head trauma: denied. Drug ingestion: none. Noise exposure: no occupational exposure. Family history: non-contributory.  The following portions of the patient's history were reviewed and updated as appropriate: allergies, current medications, past family history, past medical history, past social history, past surgical history and problem list.  Review of Systems Pertinent items are noted in HPI.     Objective:    BP 120/76  Pulse 74  Temp(Src) 97.8 F (36.6 C) (Oral)  Wt 247 lb 6.4 oz (112.22 kg)  SpO2 97% General appearance: alert, cooperative, appears stated age and no distress Head: Normocephalic, without obvious abnormality, atraumatic Eyes: conjunctivae/corneas clear. PERRL, EOM's intact. Fundi benign. Ears: normal TM's and external ear canals both ears Nose: Nares normal. Septum midline. Mucosa normal. No drainage or sinus tenderness. Throat: lips, mucosa, and tongue normal; teeth and gums normal Neck: no adenopathy, no carotid bruit, no JVD, supple, symmetrical, trachea midline and thyroid not enlarged, symmetric, no tenderness/mass/nodules Lungs: clear to auscultation bilaterally Heart: regular rate and rhythm, S1, S2 normal, no murmur, click, rub or gallop Neurologic: Alert and oriented X 3, normal strength and tone. Normal symmetric reflexes. Normal coordination and gait      Assessment:    Benign positional vertigo    Plan:    Meclizine per medication orders. Referral to Neurology. The nature of vertigo syndromes were discussed along with their usual course and  treatment. Educational materials given and questions answered. f/u prn  Make sure to drink plenty of fluids

## 2011-09-15 NOTE — Patient Instructions (Signed)

## 2011-09-16 LAB — CBC WITH DIFFERENTIAL/PLATELET
Basophils Relative: 0.3 % (ref 0.0–3.0)
Eosinophils Absolute: 0.2 10*3/uL (ref 0.0–0.7)
Eosinophils Relative: 2.3 % (ref 0.0–5.0)
HCT: 46.3 % (ref 39.0–52.0)
Lymphs Abs: 2.6 10*3/uL (ref 0.7–4.0)
MCHC: 33.7 g/dL (ref 30.0–36.0)
MCV: 95 fl (ref 78.0–100.0)
Monocytes Absolute: 0.9 10*3/uL (ref 0.1–1.0)
Neutro Abs: 3.8 10*3/uL (ref 1.4–7.7)
Neutrophils Relative %: 50.7 % (ref 43.0–77.0)
RBC: 4.88 Mil/uL (ref 4.22–5.81)

## 2011-09-16 LAB — HEPATIC FUNCTION PANEL
ALT: 47 U/L (ref 0–53)
Albumin: 4.3 g/dL (ref 3.5–5.2)
Total Protein: 7.3 g/dL (ref 6.0–8.3)

## 2011-09-16 LAB — BASIC METABOLIC PANEL
CO2: 25 mEq/L (ref 19–32)
Chloride: 108 mEq/L (ref 96–112)
Creatinine, Ser: 1 mg/dL (ref 0.4–1.5)
Potassium: 4.2 mEq/L (ref 3.5–5.1)

## 2011-09-16 LAB — VITAMIN B12: Vitamin B-12: 676 pg/mL (ref 211–911)

## 2011-09-16 LAB — TSH: TSH: 4.97 u[IU]/mL (ref 0.35–5.50)

## 2012-06-06 ENCOUNTER — Ambulatory Visit (INDEPENDENT_AMBULATORY_CARE_PROVIDER_SITE_OTHER): Payer: PRIVATE HEALTH INSURANCE | Admitting: Internal Medicine

## 2012-06-06 ENCOUNTER — Encounter: Payer: Self-pay | Admitting: Internal Medicine

## 2012-06-06 VITALS — BP 128/84 | HR 96 | Temp 98.1°F | Wt 242.0 lb

## 2012-06-06 DIAGNOSIS — J069 Acute upper respiratory infection, unspecified: Secondary | ICD-10-CM

## 2012-06-06 MED ORDER — AZELASTINE HCL 0.1 % NA SOLN: 2.0000 | Freq: Two times a day (BID) | NASAL | Status: DC

## 2012-06-06 MED ORDER — AMOXICILLIN 500 MG PO CAPS: 1000.0000 mg | ORAL_CAPSULE | Freq: Two times a day (BID) | ORAL | Status: DC
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
## 2012-06-06 NOTE — Patient Instructions (Addendum)
Rest, fluids , tylenol For cough, take Mucinex DM twice a day as needed  For congestion use astelin nasal spray twice a day until you feel better Take the antibiotic as prescribed  (Amoxicillin) if no better in few days Call if no better in few days Call anytime if the symptoms are severe, you have high fever, short of breath, chest pain

## 2012-06-08 ENCOUNTER — Telehealth: Payer: Self-pay | Admitting: Internal Medicine

## 2012-06-08 MED ORDER — FLUTICASONE PROPIONATE 50 MCG/ACT NA SUSP: 2.0000 | Freq: Every day | NASAL | Status: DC

## 2012-06-08 NOTE — Telephone Encounter (Signed)
Left detailed msg on vmail & sent rx into pharmacy.

## 2012-06-08 NOTE — Telephone Encounter (Signed)
Pt states nasal spray is causing him to sleep- he would like someone else if possible- he is also going to have antibiotic filled. He would like you to call him back, amym

## 2012-06-08 NOTE — Telephone Encounter (Signed)
Advise patient: Discontinue Astelin Start Flonase 2 sprays in each side of the nose daily, #1 bottle, 3 refills. [facility]

## 2012-07-02 ENCOUNTER — Encounter: Payer: Self-pay | Admitting: Internal Medicine

## 2012-07-02 ENCOUNTER — Ambulatory Visit (INDEPENDENT_AMBULATORY_CARE_PROVIDER_SITE_OTHER): Payer: PRIVATE HEALTH INSURANCE | Admitting: Internal Medicine

## 2012-07-02 VITALS — BP 132/86 | HR 69 | Temp 97.7°F | Wt 247.8 lb

## 2012-07-02 DIAGNOSIS — G479 Sleep disorder, unspecified: Secondary | ICD-10-CM

## 2012-07-02 DIAGNOSIS — R030 Elevated blood-pressure reading, without diagnosis of hypertension: Secondary | ICD-10-CM

## 2012-07-02 MED ORDER — METOPROLOL TARTRATE 25 MG PO TABS: 25.0000 mg | ORAL_TABLET | Freq: Two times a day (BID) | ORAL | Status: DC

## 2012-07-02 MED ORDER — CLONAZEPAM 0.5 MG PO TABS: ORAL_TABLET | ORAL | Status: DC

## 2012-07-02 NOTE — Patient Instructions (Addendum)
Exercise at least 30-45 minutes a day,  3-4 days a week.   To prevent sleep dysfunction follow these instructions for sleep hygiene. Do not read, watch TV, or eat in bed. Do not get into bed until you are ready to turn off the light &  to go to sleep. Do not ingest stimulants ( decongestants, diet pills, nicotine, caffeine) after the evening meal. Blood Pressure Goal  Ideally is an AVERAGE < 135/85. This AVERAGE should be calculated from @ least 5-7 BP readings taken @ different times of day on different days of week. You should not respond to isolated BP readings , but rather the AVERAGE for that week. Fill the prescription for metoprolol if the blood pressure is staying elevated above ideal average or if you're having the tachycardia.

## 2012-07-02 NOTE — Progress Notes (Signed)
Subjective:    Patient ID: Nathan Kelley, male    DOB: May 22, 1961, 51 y.o.   MRN: [id number]  HPI #1 ? HYPERTENSION: He questioned hypertension because of facial plethora and because of some occipital headache one day last week. This was in the context of recent job stress. Blood pressures range from 122/80-159/86 in the last week. He has no history of hypertension & has never taken medications for such.  He was exercising on a regular basis until the last 6 weeks; he has been exercising intermittently since.  He is on no specific diet; he does restrict salt.  # 2 ? INSOMNIA: In the context of job stress he has also noted some sleep disturbance in the last month. He describes it as "not being able to shut his mind down". This is manifested as early awakening and recurrently awakening. He specifically denies nightmares; abnormal limb movements; excessive snoring; or frank apnea. He took generic Benadryl but this caused a hangover effect; he describes his symptoms much like he has after significant vertigo episodes.  Sleep hygiene is not ideal as he will watch TV in bed; he does not read or eat in bed. He does travel and occasionally spends the night but this does not involve time zone travel.  Recently he did take some decongestants. He does have 2 glasses of wine at night. He is not taking daytime naps. There's been no impairment of mental function and he has had no motor vehicle accidents.          Review of Systems Chest pain: no but early am tachycardia Dyspnea: no  Claudication: no  Lightheadedness: some w/o  vertigo  Urinary frequency: no  Edema: no     Objective:   Physical Exam Gen.:  well-nourished in appearance. Alert, appropriate and cooperative throughout exam. Head: Normocephalic without obvious abnormalities  Eyes: No corneal or conjunctival inflammation noted.No lid lag or proptosis. Extraocular motion intact.  Neck: No deformities, masses, or tenderness noted.  Range of motion & Thyroid normal. Lungs: Normal respiratory effort; chest expands symmetrically. Lungs are clear to auscultation without rales, wheezes, or increased work of breathing. Heart: Normal rate and rhythm. Normal S1 and S2. No gallop, click, or rub. S4 w/o murmur. Abdomen: Bowel sounds normal; abdomen soft and nontender. No masses, organomegaly or hernias noted.No AAA .                                                                               Musculoskeletal/extremities: No deformity or scoliosis noted of  the thoracic or lumbar spine. No clubbing, cyanosis, edema, or deformity noted. Tone & strength  Normal. Vascular: Carotid, radial artery, dorsalis pedis and  posterior tibial pulses are full and equal. No bruits present. Neurologic: Alert and oriented x3. Deep tendon reflexes symmetrical and normal.          Skin: Intact without suspicious lesions or rashes. Lymph: No cervical, axillary lymphadenopathy present. Psych: Mood and affect are normal. Normally interactive  Assessment & Plan:   #1 elevated blood pressure without diagnosis of hypertension  #2 exogenous stress  #3 sleep disruption related to #2. This is most likely a major factor in #1.  Plan: See orders & recommendations

## 2012-07-06 ENCOUNTER — Telehealth: Payer: Self-pay

## 2012-07-06 NOTE — Telephone Encounter (Signed)
Left message on voicemail with Dr.Hopper's instructions

## 2012-07-06 NOTE — Telephone Encounter (Signed)
Spoke with patient, patient verbalized understanding of Dr.Hopper's instructions

## 2012-07-06 NOTE — Telephone Encounter (Signed)
Caller: [name]/Patient; Patient Name: Nathan Kelley; PCP: Marga Melnick; Best Callback Phone Number: [phone]; pt is calling and states that he has burning in right great toe; sx started 07/06/12; lasted off and on for 1 hour; took Tylenol and did help;  does have hx of arthritis; states that he doesn't want to come in to the office; no injury; no redness; no burning; Triaged per Foot Non Injury Guideline; See in 24 hours due to sensations of tingling and burning discomfort that occurs with movment and stops with rest and not previously evaluated; offered appt;  pt stated that he is out of town and would like an appt for Tuesday, 07/10/12; office called to schedule an appt on 07/10/12; scheduler took information and will call pt back with appt; pt made aware and will comply; instructed to call back if sx worsen; will comply;

## 2012-07-06 NOTE — Telephone Encounter (Signed)
Take Aleve one to 2 every 8-12 hours with food as needed. Do not take aspirin or ibuprofen if taking Aleve

## 2012-07-06 NOTE — Telephone Encounter (Signed)
Caller nurse states pt woke up this morning pain burning in right great toe. Pt has been seen recently by [facility]. Pt didn't state that there wasn't any swelling, readness, and  warmth. See in 24 hours but pt requesting Tuesday appt since he is out of town.  PLz  Advise      MW

## 2012-09-18 ENCOUNTER — Encounter: Payer: PRIVATE HEALTH INSURANCE | Admitting: Internal Medicine

## 2012-10-31 ENCOUNTER — Telehealth: Payer: Self-pay | Admitting: Internal Medicine

## 2012-10-31 ENCOUNTER — Encounter: Payer: Self-pay | Admitting: Lab

## 2012-10-31 NOTE — Telephone Encounter (Signed)
Noted  

## 2012-10-31 NOTE — Telephone Encounter (Signed)
Left message on VM informing patient to return call, patient to be seen today by Dr.Paz or patient can be placed in Dr.Hopper's 1:00 pm slot for tomorrow if he only would like to see primary care doctor.

## 2012-10-31 NOTE — Telephone Encounter (Signed)
To MD for review     KP 

## 2012-10-31 NOTE — Telephone Encounter (Signed)
Patient Information:  Caller Name: Yonah  Phone: [phone]  Patient: Nathan Kelley, Nathan Kelley  Gender: Male  DOB: 09/28/1960  Age: 52 Years  PCP: Marga Melnick  Office Follow Up:  Does the office need to follow up with this patient?: No  Instructions For The Office: N/A   Symptoms  Reason For Call & Symptoms: HBP in past.  BP readings up yesterday 2/25 at [facility] 162/97, [facility] 153/?, last reading 157/97.  Reviewed Health History In EMR: Yes  Reviewed Medications In EMR: Yes  Reviewed Allergies In EMR: Yes  Reviewed Surgeries / Procedures: Yes  Date of Onset of Symptoms: 10/30/2012  Guideline(s) Used:  High Blood Pressure  Disposition Per Guideline:   See Within 2 Weeks in Office  Reason For Disposition Reached:   BP > 140/90 and is not taking BP medications  Advice Given:  Lifestyle Changes  Eat a diet high in fresh fruits and low-fat dairy products. Limit your intake of saturated and total fat. Choose foods that are lower in salt.  Call Back If:  Headache, blurred vision, difficulty talking, or difficulty walking occurs  Chest pain or difficulty breathing occurs  You want to go in to the office for a blood pressure check  You become worse. Had spoken with office/Brittany - Told to call office in am 2/27 and would work him in.

## 2012-10-31 NOTE — Telephone Encounter (Signed)
pt states he was told to come in a t 1pm tomorrow- I did place him in a doc of the dat spot 1pm 2.27.14

## 2012-10-31 NOTE — Telephone Encounter (Signed)
Left message on VM for patient to return call to schedule appointment

## 2012-11-01 ENCOUNTER — Ambulatory Visit (INDEPENDENT_AMBULATORY_CARE_PROVIDER_SITE_OTHER): Payer: PRIVATE HEALTH INSURANCE | Admitting: Internal Medicine

## 2012-11-01 ENCOUNTER — Encounter: Payer: Self-pay | Admitting: Internal Medicine

## 2012-11-01 VITALS — BP 152/96 | HR 61 | Temp 97.3°F | Wt 247.0 lb

## 2012-11-01 DIAGNOSIS — I10 Essential (primary) hypertension: Secondary | ICD-10-CM

## 2012-11-01 MED ORDER — METOPROLOL TARTRATE 25 MG PO TABS: 25.0000 mg | ORAL_TABLET | Freq: Two times a day (BID) | ORAL | Status: DC

## 2012-11-01 NOTE — Patient Instructions (Addendum)
Minimal Blood Pressure Goal= AVERAGE < 140/90;  Ideal is an AVERAGE < 135/85. This AVERAGE should be calculated from @ least 5-7 BP readings taken @ different times of day on different days of week. You should not respond to isolated BP readings , but rather the AVERAGE for that week .Please bring your  blood pressure cuff to office visits to verify that it is reliable.It  can also be checked against the blood pressure device at the pharmacy. 

## 2012-11-01 NOTE — Progress Notes (Signed)
  Subjective:    Patient ID: Nathan Kelley, male    DOB: [birth year], 52 y.o.   MRN: [id number]  HPI  HYPERTENSION :  On 10/29/12 began feeling dizzy & vertiginous, fatigued and nauseous with BP recorded as 162/97. Dramamine resolved the symptoms.     Patient is on no BP medications; he was given Rx for Beta blocker which was never filled.    Exercise program of CVE and weight training 3-4 times weekly for 1 hr.   Heart healthy dietary program modified  low-salt diet          Review of Systems No chest pain, palpitations, dyspnea, claudication,edema or paroxysmal nocturnal dyspnea described  No significant lightheadedness, headache, epistaxis, or syncope since initial symptoms above.       Objective:   Physical Exam Appears healthy and well-nourished & in no acute distress  No carotid bruits are present.No neck pain distention present at 10 - 15 degrees. Thyroid normal to palpation  Heart rhythm and rate are normal with no significant murmurs or gallops.  Chest is clear with no increased work of breathing  There is no evidence of aortic aneurysm or renal artery bruits  Abdomen soft with no organomegaly or masses. No HJR  No clubbing, cyanosis or edema present.  Pedal pulses are intact   No ischemic skin changes are present .   Alert and oriented. Strength, tone, DTRs reflexes normal  BP with his cuff 152/90;ours 145/91.          Assessment & Plan:  #1 HTN Plan: See orders and recommendations

## 2012-11-02 ENCOUNTER — Ambulatory Visit: Payer: PRIVATE HEALTH INSURANCE | Admitting: Internal Medicine

## 2013-01-11 ENCOUNTER — Encounter: Payer: Self-pay | Admitting: Lab

## 2013-01-14 ENCOUNTER — Ambulatory Visit (INDEPENDENT_AMBULATORY_CARE_PROVIDER_SITE_OTHER): Payer: PRIVATE HEALTH INSURANCE | Admitting: Internal Medicine

## 2013-01-14 ENCOUNTER — Encounter: Payer: Self-pay | Admitting: Internal Medicine

## 2013-01-14 VITALS — BP 126/80 | HR 72 | Temp 97.8°F | Resp 12 | Ht 74.0 in | Wt 250.0 lb

## 2013-01-14 DIAGNOSIS — I10 Essential (primary) hypertension: Secondary | ICD-10-CM

## 2013-01-14 DIAGNOSIS — Z0000 Encounter for general adult medical examination without abnormal findings: Secondary | ICD-10-CM

## 2013-01-14 DIAGNOSIS — R1319 Other dysphagia: Secondary | ICD-10-CM

## 2013-01-14 NOTE — Addendum Note (Signed)
Addended by: Maurice Small on: 01/14/2013 02:25 PM   Modules accepted: Orders

## 2013-01-14 NOTE — Patient Instructions (Addendum)
Reflux of gastric acid may be asymptomatic as this may occur mainly during sleep.The triggers for reflux  include stress; the "aspirin family" ; alcohol; peppermint; and caffeine (coffee, tea, cola, and chocolate). The aspirin family would include aspirin and the nonsteroidal agents such as ibuprofen &  Naproxen. Tylenol would not cause reflux. If having symptoms ; food & drink should be avoided for @ least 2 hours before going to bed.  Please eat your meals slowly and drink plenty of fluids; if the dysphagia persists or progresses; it is imperative that the endoscopy be repeated. Minimal Blood Pressure Goal= AVERAGE < 140/90;  Ideal is an AVERAGE < 135/85. This AVERAGE should be calculated from @ least 5-7 BP readings taken @ different times of day on different days of week. You should not respond to isolated BP readings , but rather the AVERAGE for that week .Please bring your  blood pressure cuff to office visits to verify that it is reliable.It  can also be checked against the blood pressure device at the pharmacy. Finger or wrist cuffs are not dependable; an arm cuff is. Please consider scheduling fasting Labs : BMET,Lipids, hepatic panel, CBC & dif, TSH.PLEASE BRING THESE INSTRUCTIONS TO FOLLOW UP  LAB APPOINTMENT.This will guarantee correct labs are drawn, eliminating need for repeat blood sampling ( needle sticks ! ). Diagnoses /Codes: V70.0. As per the Standard of Care , screening Colonoscopy recommended @ 50 & every 5-10 years thereafter . More frequent monitor would be dictated by family history or findings @ Colonoscopy If you activate the  My Chart system; lab & Xray results will be released directly  to you as soon as I review & address these through the computer. If you choose not to sign up for My Chart within 36 hours of labs being drawn; results will be reviewed & interpretation added before being copied & mailed, causing a delay in getting the results to you.If you do not receive that report  within 7-10 days ,please call. Additionally you can use this system to gain direct  access to your records  if  out of town or @ an office of a  physician who is not in  the My Chart network.  This improves continuity of care & places you in control of your medical record.

## 2013-01-14 NOTE — Progress Notes (Signed)
Subjective:    Patient ID: Nathan Kelley, male    DOB: 1961-06-15, 52 y.o.   MRN: [id number]  HPI  Nathan Kelley  is here for a physical; he denies acute issues.     Review of Systems CHRONIC HYPERTENSION follow-up: Home blood pressure range 120-134/80-82  Patient is compliant with medications  No adverse effects noted from medication  Exercise program as treadmill , weights , strength, & yoga 3-4  times per week for 60 minutes  Dietary program   low carb , low-salt diet    No chest pain, palpitations, dyspnea, claudication, or paroxysmal nocturnal dyspnea described. Edema only with prolonged travel. No significant lightheadedness, headache, epistaxis, or syncope.  He had both upper endoscopy and colonoscopy in 1989 at [facility] for severe diarrhea. He has not had a repeat colonoscopy since because of a high deductible. He has not had unexplained weight loss, melena, or rectal bleeding.He has dysphagia almost daily; he relates this to eating too fast Standard of care for colonoscopy reviewed         Objective:   Physical Exam  Gen.: Healthy and well-nourished in appearance. Alert, appropriate and cooperative throughout exam. Appears younger than stated age  Head: Normocephalic without obvious abnormalities;  pattern alopecia  Eyes: No corneal or conjunctival inflammation noted. Pupils equal round reactive to light and accommodation. Fundal exam is benign without hemorrhages, exudate, papilledema. Extraocular motion intact. Vision grossly normal without  lenses Ears: External  ear exam reveals no significant lesions or deformities. Canals clear .TMs normal. Hearing is grossly normal bilaterally. Nose: External nasal exam reveals no deformity or inflammation. Nasal mucosa are pink and moist. No lesions or exudates noted.  Mouth: Oral mucosa and oropharynx reveal no lesions or exudates. Teeth in good repair. Neck: No deformities, masses, or tenderness noted. Range of motion &  Thyroid normal. Lungs: Normal respiratory effort; chest expands symmetrically. Lungs are clear to auscultation without rales, wheezes, or increased work of breathing. Heart: Normal rate and rhythm. Normal S1 and S2. No gallop, click, or rub. S4 w/o murmur. Abdomen: Bowel sounds normal; abdomen soft and nontender. No masses, organomegaly or hernias noted. Genitalia: Genitalia normal except for left varices. Prostate is normal without enlargement, asymmetry, nodularity, or induration.                               Musculoskeletal/extremities: No deformity or scoliosis noted of  the thoracic or lumbar spine.  No clubbing, cyanosis, edema, or significant extremity  deformity noted. Range of motion normal .Tone & strength  Normal. Joints  reveal minor DIP changes. Nail health good. Able to lie down & sit up w/o help. Negative SLR bilaterally Vascular: Carotid, radial artery, dorsalis pedis and  posterior tibial pulses are full and equal. No bruits present. Neurologic: Alert and oriented x3. Deep tendon reflexes symmetrical and normal.        Skin: Intact without suspicious lesions or rashes. Lymph: No cervical, axillary, or inguinal lymphadenopathy present. Psych: Mood and affect are normal. Normally interactive  Assessment & Plan:  #1 comprehensive physical exam; no acute findings  Plan: see Orders  & Recommendations

## 2013-01-18 ENCOUNTER — Encounter: Payer: Self-pay | Admitting: Lab

## 2013-01-21 ENCOUNTER — Other Ambulatory Visit: Payer: PRIVATE HEALTH INSURANCE

## 2013-01-24 ENCOUNTER — Other Ambulatory Visit: Payer: PRIVATE HEALTH INSURANCE

## 2013-01-29 ENCOUNTER — Other Ambulatory Visit (INDEPENDENT_AMBULATORY_CARE_PROVIDER_SITE_OTHER): Payer: PRIVATE HEALTH INSURANCE

## 2013-01-29 DIAGNOSIS — Z0000 Encounter for general adult medical examination without abnormal findings: Secondary | ICD-10-CM

## 2013-01-29 LAB — HEPATIC FUNCTION PANEL
ALT: 35 U/L (ref 0–53)
AST: 30 U/L (ref 0–37)
Bilirubin, Direct: 0.2 mg/dL (ref 0.0–0.3)
Total Bilirubin: 0.8 mg/dL (ref 0.3–1.2)

## 2013-01-29 LAB — CBC WITH DIFFERENTIAL/PLATELET
Basophils Relative: 0.3 % (ref 0.0–3.0)
Eosinophils Absolute: 0.1 10*3/uL (ref 0.0–0.7)
Eosinophils Relative: 1.9 % (ref 0.0–5.0)
HCT: 47.3 % (ref 39.0–52.0)
Lymphs Abs: 2.2 10*3/uL (ref 0.7–4.0)
MCHC: 34.7 g/dL (ref 30.0–36.0)
MCV: 92.4 fl (ref 78.0–100.0)
Monocytes Absolute: 0.6 10*3/uL (ref 0.1–1.0)
RBC: 5.12 Mil/uL (ref 4.22–5.81)
WBC: 7.8 10*3/uL (ref 4.5–10.5)

## 2013-01-29 LAB — BASIC METABOLIC PANEL
BUN: 17 mg/dL (ref 6–23)
CO2: 27 mEq/L (ref 19–32)
Calcium: 9.1 mg/dL (ref 8.4–10.5)
GFR: 91.29 mL/min (ref >60.00)
Glucose, Bld: 97 mg/dL (ref 70–99)
Sodium: 138 mEq/L (ref 135–145)

## 2013-01-29 LAB — LIPID PANEL
HDL: 44.5 mg/dL (ref >39.00)
Total CHOL/HDL Ratio: 2
VLDL: 18.2 mg/dL (ref 0.0–40.0)

## 2013-05-26 ENCOUNTER — Observation Stay
Admission: EM | Admit: 2013-05-26 | Discharge: 2013-05-27 | DRG: 195 | Disposition: A | Discharge status: Home / Self Care | Payer: BC Managed Care – PPO | Attending: Emergency Medicine | Admitting: Emergency Medicine

## 2013-05-26 ENCOUNTER — Inpatient Hospital Stay: Payer: BC Managed Care – PPO | Admitting: Emergency Medicine

## 2013-05-26 ENCOUNTER — Emergency Department: Payer: BC Managed Care – PPO

## 2013-05-26 DIAGNOSIS — J189 Pneumonia, unspecified organism: Principal | ICD-10-CM | POA: Diagnosis present

## 2013-05-26 DIAGNOSIS — K219 Gastro-esophageal reflux disease without esophagitis: Secondary | ICD-10-CM | POA: Diagnosis present

## 2013-05-26 DIAGNOSIS — Z79899 Other long term (current) drug therapy: Secondary | ICD-10-CM | POA: Insufficient documentation

## 2013-05-26 DIAGNOSIS — R0600 Dyspnea, unspecified: Secondary | ICD-10-CM | POA: Diagnosis present

## 2013-05-26 DIAGNOSIS — G4733 Obstructive sleep apnea (adult) (pediatric): Secondary | ICD-10-CM | POA: Diagnosis present

## 2013-05-26 DIAGNOSIS — R079 Chest pain, unspecified: Secondary | ICD-10-CM | POA: Diagnosis present

## 2013-05-26 HISTORY — DX: Gastro-esophageal reflux disease without esophagitis: K21.9

## 2013-05-26 HISTORY — DX: Obstructive sleep apnea (adult) (pediatric): G47.33

## 2013-05-26 LAB — I-STAT TROPONIN: Troponin I I-Stat: <0.02 ng/mL (ref 0.00–0.02)

## 2013-05-26 LAB — CBC AND DIFFERENTIAL
Basophils %: 0.1 % (ref 0.0–3.0)
Basophils Absolute: 0 10*3/uL (ref 0.0–0.3)
Eosinophils %: 1.5 % (ref 0.0–7.0)
Eosinophils Absolute: 0.2 10*3/uL (ref 0.0–0.8)
Hematocrit: 46.4 % (ref 39.0–52.5)
Hemoglobin: 15.3 gm/dL (ref 13.0–17.5)
Lymphocytes Absolute: 2 10*3/uL (ref 0.6–5.1)
Lymphocytes: 14.1 % — ABNORMAL LOW (ref 15.0–46.0)
MCH: 29 pg (ref 28–35)
MCHC: 33 gm/dL (ref 32–36)
MCV: 86 fL (ref 80–100)
MPV: 6.8 fL (ref 6.0–10.0)
Monocytes Absolute: 1.3 10*3/uL (ref 0.1–1.7)
Monocytes: 9 % (ref 3.0–15.0)
Neutrophils %: 75.3 % (ref 42.0–78.0)
Neutrophils Absolute: 10.8 10*3/uL — ABNORMAL HIGH (ref 1.7–8.6)
PLT CT: 295 10*3/uL (ref 130–440)
RBC: 5.38 10*6/uL (ref 4.00–5.70)
RDW: 12.4 % (ref 11.0–14.0)
WBC: 14.4 10*3/uL — ABNORMAL HIGH (ref 4.0–11.0)

## 2013-05-26 LAB — BASIC METABOLIC PANEL
Anion Gap: 12.6 mMol/L (ref 7.0–18.0)
BUN / Creatinine Ratio: 17.6 Ratio (ref 10.0–30.0)
BUN: 15 mg/dL (ref 7–22)
CO2: 23.4 mMol/L (ref 20.0–30.0)
Calcium: 9.6 mg/dL (ref 8.5–10.5)
Chloride: 109 mMol/L (ref 98–110)
Creatinine: 0.85 mg/dL (ref 0.80–1.30)
EGFR: >60 mL/min/{1.73_m2}
Glucose: 113 mg/dL — ABNORMAL HIGH (ref 70–99)
Osmolality Calc: 283 mOsm/kg (ref 275–300)
Potassium: 4 mMol/L (ref 3.5–5.3)
Sodium: 141 mMol/L (ref 136–147)

## 2013-05-26 LAB — VH CARDIAC PROF.WITH TROPONIN
Creatine Kinase (CK): 80 U/L (ref 30–230)
Creatine Kinase (CK): 76 U/L (ref 30–230)
Creatinine Kinase MB (CKMB): 0.6 ng/mL (ref 0.1–6.0)
Creatinine Kinase MB (CKMB): 0.4 ng/mL (ref 0.1–6.0)
Troponin I: <0.01 ng/mL (ref 0.00–0.02)
Troponin I: <0.01 ng/mL (ref 0.00–0.02)

## 2013-05-26 LAB — URINALYSIS WITH CULTURE REFLEX
Bilirubin, UA: NEGATIVE mg/dL
Blood, UA: NEGATIVE mg/dL
Glucose, UA: NEGATIVE mg/dL
Leukocyte Esterase, UA: 75 Leu/uL — AB
Nitrite, UA: NEGATIVE
RBC, UA: 1 /hpf (ref 0–4)
Squam Epithel, UA: 2 /hpf (ref 0–2)
Urine Protein: NEGATIVE mg/dL
Urine Specific Gravity: 1.018 (ref 1.001–1.040)
Urobilinogen, UA: NORMAL mg/dL
WBC, UA: 7 /hpf — ABNORMAL HIGH (ref 0–4)
pH, Urine: 6.5 pH (ref 5.0–8.0)

## 2013-05-26 LAB — ECG 12-LEAD
P Wave Axis: 31 deg
P Wave Duration: 120 ms
P-R Interval: 182 ms
Patient Age: 51 years
Q-T Dispersion: 16 ms
Q-T Interval(Corrected): 450 ms
Q-T Interval: 364 ms
QRS Axis: -2 deg
QRS Duration: 100 ms
T Axis: 26 deg
Ventricular Rate: 92 /min

## 2013-05-26 LAB — B-TYPE NATRIURETIC PEPTIDE: B-Natriuretic Peptide: <10 pg/mL (ref 0.0–100.0)

## 2013-05-26 LAB — VH I-STAT TROPONIN NOTIFICATION

## 2013-05-26 LAB — D-DIMER, QUANTITATIVE: D-Dimer: <0.19 mg/L FEU — ABNORMAL LOW (ref 0.19–0.52)

## 2013-05-26 MED ORDER — MORPHINE SULFATE 2 MG/ML IJ/IV SOLN (WRAP)
2.0000 mg | Status: DC | PRN
Start: 2013-05-26 — End: 2013-05-27
  Administered 2013-05-26: 2 mg via INTRAVENOUS
  Filled 2013-05-26: qty 1

## 2013-05-26 MED ORDER — PANTOPRAZOLE SODIUM 20 MG PO TBEC
20.0000 mg | DELAYED_RELEASE_TABLET | Freq: Every day | ORAL | Status: DC
Start: 2013-05-26 — End: 2013-05-27
  Administered 2013-05-27: 20 mg via ORAL
  Filled 2013-05-26 (×2): qty 1

## 2013-05-26 MED ORDER — ONDANSETRON HCL 4 MG/2ML IJ SOLN
4.0000 mg | Freq: Once | INTRAMUSCULAR | Status: AC
Start: 2013-05-26 — End: 2013-05-26
  Administered 2013-05-26: 4 mg via INTRAVENOUS

## 2013-05-26 MED ORDER — SODIUM CHLORIDE 0.9 % IV MBP
1.0000 g | Freq: Two times a day (BID) | INTRAVENOUS | Status: DC
Start: 2013-05-26 — End: 2013-05-27
  Administered 2013-05-26 – 2013-05-27 (×2): 1 g via INTRAVENOUS
  Filled 2013-05-26 (×2): qty 1000

## 2013-05-26 MED ORDER — ZOLPIDEM TARTRATE 5 MG PO TABS
5.0000 mg | ORAL_TABLET | Freq: Every evening | ORAL | Status: DC | PRN
Start: 2013-05-26 — End: 2013-05-27

## 2013-05-26 MED ORDER — NITROGLYCERIN 0.4 MG SL SUBL
0.40 mg | SUBLINGUAL_TABLET | SUBLINGUAL | Status: DC | PRN
Start: 2013-05-26 — End: 2013-05-27

## 2013-05-26 MED ORDER — ACETAMINOPHEN 325 MG PO TABS
650.0000 mg | ORAL_TABLET | Freq: Four times a day (QID) | ORAL | Status: DC | PRN
Start: 2013-05-26 — End: 2013-05-27
  Administered 2013-05-26 – 2013-05-27 (×3): 650 mg via ORAL
  Filled 2013-05-26 (×3): qty 2

## 2013-05-26 MED ORDER — MORPHINE SULFATE 2 MG/ML IJ/IV SOLN (WRAP)
4.0000 mg | Freq: Once | Status: AC
Start: 2013-05-26 — End: 2013-05-26
  Administered 2013-05-26: 4 mg via INTRAVENOUS

## 2013-05-26 MED ORDER — AZITHROMYCIN 250 MG PO TABS
500.0000 mg | ORAL_TABLET | Freq: Once | ORAL | Status: AC
Start: 2013-05-26 — End: 2013-05-26
  Administered 2013-05-26: 500 mg via ORAL
  Filled 2013-05-26: qty 2

## 2013-05-26 MED ORDER — MORPHINE SULFATE (PF) 2 MG/ML IV SOLN
INTRAVENOUS | Status: AC
Start: 2013-05-26 — End: ?
  Filled 2013-05-26: qty 2

## 2013-05-26 MED ORDER — ONDANSETRON HCL 4 MG/2ML IJ SOLN
INTRAMUSCULAR | Status: AC
Start: 2013-05-26 — End: ?
  Filled 2013-05-26: qty 2

## 2013-05-26 MED ORDER — SODIUM CHLORIDE 0.9 % IJ SOLN
3.0000 mL | Freq: Three times a day (TID) | INTRAMUSCULAR | Status: DC
Start: 2013-05-26 — End: 2013-05-27
  Administered 2013-05-26 – 2013-05-27 (×2): 3 mL via INTRAVENOUS

## 2013-05-26 MED ORDER — LORAZEPAM 0.5 MG PO TABS
0.5000 mg | ORAL_TABLET | Freq: Four times a day (QID) | ORAL | Status: DC | PRN
Start: 2013-05-26 — End: 2013-05-27

## 2013-05-26 MED ORDER — AZITHROMYCIN 250 MG PO TABS
250.0000 mg | ORAL_TABLET | Freq: Every day | ORAL | Status: DC
Start: 2013-05-27 — End: 2013-05-27
  Administered 2013-05-27: 250 mg via ORAL
  Filled 2013-05-26: qty 1

## 2013-05-26 NOTE — H&P (Signed)
[facility]  OBSERVATION UNIT  HISTORY AND PHYSICAL       Patient: Peter Ramos  Admission Date: 05/26/2013    DOB: 1960/10/28  Age: 52 y.o.    MRN: [id number]  Sex: male    PCP: Peter Crape, MD  Attending: Hassell Done MD         HISTORY OF PRESENT ILLNESS     Peter Ramos is a 52 y.o. male who presented with sternal and left anterior chest pain since waking this morning.  The pain has been present for the entire day.  At its worst rated as 8/10 and currently a 5/10 after getting morphine in the ED.  The pain is associated with SOB.  It is positional in nature. It does not worsen with exertion. Pt notes worsening pain with laying supine.  Sitting up improves the pain.  He denies recent fever, chills, or malaise.  He has a hx of recurrent ear infections and notes his ears "have been bothering me for a few days".  He denies sore throat or runny nose.  Denies significant coughing or wheezing.  He also notes that he has had "strong" urine intermittently for several weeks.  Denies hematuria, frequency, urgency, or dysuria.      Pt was evaluated in the ED with WBC count of 14.4, BNP <10, d-dimer <0.19, trop <0.02, and CXR indicating left basilar linear atelectasis.  He has PMH of OSA and GERD.  He had a stress test several years ago that was normal.      Obs consultation was requested, the pt was evaluated, and subsequently transferred to the obs unit.        PAST MEDICAL HISTORY     Code Status: No Order    Past Medical History   Diagnosis Date   . GERD (gastroesophageal reflux disease)    . OSA (obstructive sleep apnea)      wears cpap at night       History reviewed. No pertinent past surgical history.    Current/Home Medications    PANTOPRAZOLE (PROTONIX) 20 MG TABLET    Take 20 mg by mouth daily.       Allergies: No Known Allergies    Family History   Problem Relation Age of Onset   . Adopted: Yes   . Family history unknown: Yes       SHx:  reports that he has never smoked. He  does not have any smokeless tobacco history on file. He reports that he drinks alcohol. He reports that he does not use illicit drugs.    REVIEW OF SYSTEMS     A complete 10 point review of systems was completed and was negative except as noted in the HPI.     PHYSICAL EXAM     Vital Signs: Blood pressure 105/45, pulse 86, temperature 98.9 F (37.2 C), resp. rate 18, height 1.753 m (5\' 9" ), weight 128.6 kg (283 lb 8.2 oz), SpO2 97.00%.    Constitutional: Vital signs reviewed. Patient is in mild discomfort. He is seated nearly upright in the gurney.  He is in NAD.    Eyes: Pupils equal. Reactive to light.  ENMT: Mucous membranes moist. Normal nasal mucosa. Normal lips, teeth and gums Oropharynx clear without erythema. Bilateral TMs with scarring, congestion, and mild erythema.    Respiratory: Lungs are clear bilaterally.  Good air movement.  No wheezing, rales, or rhonchi.  Deep inspiration elicits cough.  Chest wall is NTTP.  Cardiovascular: Heart regular rate and rhythm. Trace LE noted bilaterally.    GI/GU: Abdomen is soft, nontender; no guarding or rigidity.  Skin: Skin is warm and dry. No rashes.  Neurologic: Awake and alert. Memory intact. No CN deficits. Patient ambulates without difficulty.  Psychiatric: Normal affect and insight; no agitation. Appropriate for age.  Musculoskeletal: Neck is supple. Trachea is midline. No calf TTP. No CVA tenderness. No midline tenderness to palpation.      LABS & IMAGING     Labs:  Results for orders placed during the hospital encounter of 05/26/13   CBC AND DIFFERENTIAL       Component Value Range    WBC 14.4 (*) 4.0 - 11.0 K/cmm    RBC 5.38  4.00 - 5.70 M/cmm    Hemoglobin 15.3  13.0 - 17.5 gm/dL    Hematocrit 01.0  27.2 - 52.5 %    MCV 86  80 - 100 fL    MCH 29  28 - 35 pg    MCHC 33  32 - 36 gm/dL    RDW 53.6  64.4 - 03.4 %    PLT CT 295  130 - 440 K/cmm    MPV 6.8  6.0 - 10.0 fL    NEUTROPHIL % 75.3  42.0 - 78.0 %    Lymphocytes 14.1 (*) 15.0 - 46.0 %    Monocytes 9.0   3.0 - 15.0 %    Eosinophils % 1.5  0.0 - 7.0 %    Basophils % 0.1  0.0 - 3.0 %    Neutrophils Absolute 10.8 (*) 1.7 - 8.6 K/cmm    Lymphocytes Absolute 2.0  0.6 - 5.1 K/cmm    Monocytes Absolute 1.3  0.1 - 1.7 K/cmm    Eosinophils Absolute 0.2  0.0 - 0.8 K/cmm    BASO Absolute 0.0  0.0 - 0.3 K/cmm   BASIC METABOLIC PANEL       Component Value Range    Sodium 141  136 - 147 mMol/L    Potassium 4.0  3.5 - 5.3 mMol/L    Chloride 109  98 - 110 mMol/L    CO2 23.4  20.0 - 30.0 mMol/L    CALCIUM 9.6  8.5 - 10.5 mg/dL    Glucose 742 (*) 70 - 99 mg/dL    Creatinine 5.95  6.38 - 1.30 mg/dL    BUN 15  7 - 22 mg/dL    Anion Gap 75.6  7.0 - 18.0 mMol/L    BUN/Creatinine Ratio 17.6  10.0 - 30.0 Ratio    EGFR >60      Osmolality Calc 283  275 - 300 mOsm/kg   VH I-STAT TROPONIN NOTIFICATION       Component Value Range    I-STAT Notification Istat Notification     I-STAT TROPONIN       Component Value Range    Trop I, ISTAT <0.02  0.00 - 0.02 ng/mL   D-DIMER, QUANTITATIVE       Component Value Range    D-Dimer <0.19 (*) 0.19 - 0.52 mg/L FEU   B-TYPE NATRIURETIC PEPTIDE       Component Value Range    B-Natriuretic Peptide <10.0  0.0 - 100.0 pg/mL       Imaging:  XR CHEST AP PORTABLE    Final Result: Left basilar linear atelectasis.         ReadingStation:[email or web address]       EKG: Sinus rhythm; negative for ischemic changes. HR 92.  EMERGENCY DEPARTMENT COURSE     ED Medication Orders      Start     Status Ordering Provider    05/26/13 1335   morphine injection 4 mg   Once in ED      Route: Intravenous  Ordered Dose: 4 mg         Last MAR action:  Given Ramos, Peter B    05/26/13 1335   ondansetron (ZOFRAN) injection 4 mg   Once in ED      Route: Intravenous  Ordered Dose: 4 mg         Last MAR action:  Given Ramos, Peter B                ED documentation including nurses notes were reviewed by myself.  The case was discussed with the ED Attending, Dr. Micah Ramos and Obs Attending, Dr. Effie Ramos.      ASSESSMENT & PLAN     Peter Ramos is a 52 y.o. male admitted under OBSERVATION with Chest Pain and Dyspnea.      Assessment & Plan:  1. Chest Pain/Dyspnea  Admit to Observation Unit for cardiac work up.   -Telemetry  -Serial cardiac enzymes.    -Repeat ECG  -Fasting Lipid Panel in AM  -Nitrates for pain as needed  -Nuclear Stress Test  In AM  -Cardiac Consultation PRN    2. Left Basilar Linear Atelectasis/Leukocytosis   Will start IV Rocephin and PO azithromycin   Consider repeat CXR   Antipyretics prn   Check UA     I certify the need for admission to the Observation Unit based on the patient's history and the information above.    Blondell Reveal [place], [place]   05/26/2013 2:34 PM

## 2013-05-26 NOTE — ED Provider Notes (Signed)
Physician/Midlevel provider first contact with patient: 05/26/13 1328             [facility] EMERGENCY DEPARTMENT  History and Physical Exam        Patient Name: Peter Ramos, Peter Ramos  Encounter Date:  05/26/2013  Attending Physician: Magdalene Molly, MD  PCP: Edmon Crape, MD  Patient DOB:  10/01/1960  MRN:  [id number]  Room:  2520/2520-A      History of Presenting Illness       Chief complaint: Chest Pain    HPI/ROS is limited by: none  HPI/ROS given by: patient    Location: chest  Duration: this AM  Severity: moderate    Peter Ramos is a 52 y.o. male who presents with SOB / chest pain.  Pt was fine last night and awoke with CP and SOB today.  CP worse with breathing.  Pain into left arm.  HAs had neg stress test few years ago.      Review of Systems       Review of Systems   Respiratory: Positive for cough and shortness of breath.    Cardiovascular: Positive for chest pain.   Neurological: Positive for headaches.       Allergies       Pt  has no known allergies.      Medications       Discharge Medication List as of 05/28/2013  9:37 AM      CONTINUE these medications which have NOT CHANGED    Details   pantoprazole (PROTONIX) 20 MG tablet Take 20 mg by mouth daily., Until Discontinued, Historical Med             Past Medical, Family and Social History       Pt has a past medical history of GERD (gastroesophageal reflux disease) and OSA (obstructive sleep apnea).    Pt has no past surgical history on file.    The family history includes Heart attack in his father and sister and Pulmonary embolism in his other.  He is adopted.    Pt reports that he has never smoked. He does not have any smokeless tobacco history on file. He reports that he drinks alcohol. He reports that he does not use illicit drugs.      Physical Exam       Constitutional: Patient is in mild discomfort.   Eyes: Pupils equal. EOMI.   ENMT: Normal appearance of ears/nose. Mucous membranes moist.   Respiratory: Lungs are clear bilaterally. No work  of breathing.   Cardiovascular: Heart regular rate and rhythm. Leg edema 2+ bilaterally.   GI/GU: Abdomen is soft, nontender; no guarding or rigidity.   Skin: Skin is warm and dry.   Neurologic: Awake and alert. Memory intact.   Psychiatric: Normal affect and insight; no agitation.   Musculoskeletal: Neck is supple. Trachea is midline. No calf TTP.     Blood pressure 110/69, pulse 83, temperature 97.9 F (36.6 C), temperature source Oral, resp. rate 18, height 1.753 m, weight 128.6 kg, SpO2 96.00%.    Orders and Results     Labs Reviewed   CBC AND DIFFERENTIAL - Abnormal; Notable for the following:     WBC 14.4 (*)     Lymphocytes 14.1 (*)     Neutrophils Absolute 10.8 (*)     All other components within normal limits   BASIC METABOLIC PANEL - Abnormal; Notable for the following:     Glucose 113 (*)  All other components within normal limits   D-DIMER, QUANTITATIVE - Abnormal; Notable for the following:     D-Dimer <0.19 (*)     All other components within normal limits   URINALYSIS WITH CULTURE REFLEX - Abnormal; Notable for the following:     Leukocyte Esterase, UA 75 (*)     WBC, UA 7 (*)     Bacteria, UA Rare (*)     All other components within normal limits    Narrative:     A Urine Culture has been ordered based upon the Positive UA results.   LIPID PANEL - Abnormal; Notable for the following:     Cholesterol 201 (*)     HDL 39 (*)     All other components within normal limits   CBC - Abnormal; Notable for the following:     WBC 12.5 (*)     All other components within normal limits   BASIC METABOLIC PANEL - Abnormal; Notable for the following:     Glucose 119 (*)     All other components within normal limits   VH I-STAT TROPONIN NOTIFICATION   I-STAT TROPONIN   B-TYPE NATRIURETIC PEPTIDE   VH CARDIAC PROF.WITH TROPONIN   VH CARDIAC PROF.WITH TROPONIN   VH CULTURE, URINE    Narrative:     Specimen: Urine, Random  Collected: 05/26/2013 17:26     Status: Final      Last Updated: 05/29/2013 09:47                 ISO #1 (Final)      10,000 - 50,000 CFU/mL      Streptococcus agalactiae (Group B)                               ISO #1                            Streptococcus                            agalactiae (Group B)                            ---------------------    MIC (mcg/ml)      Benzylpenicillin (P)  <=0.12           S      Cefazolin (CFZ)                        S      Vancomycin (V)        <=0.5            S     Antibiotic Therapy Table:      Drug                  Cost     Route   --------------------  -------  --------------   CEFAZOLIN (CFZ)       $        IV   PENICILLIN (P)        $        PO   PENICILLIN (P)        $$       IV   VANCOMYCIN (V)        $$$  IV             XR CHEST AP PORTABLE    Final Result: Left basilar linear atelectasis.         ReadingStation:[email or web address]   NM MYOCARDIAL PERFUSION SPECT (STRESS AND REST)    (Results Pending)   CV CARDIAC STRESS TEST TRACING ONLY    (Results Pending)       ED Medication Orders      Start     Status Ordering Provider    05/26/13 1335   morphine injection 4 mg   Once in ED      Route: Intravenous  Ordered Dose: 4 mg         Last MAR action:  Given Oneita Allmon B    05/26/13 1335   ondansetron (ZOFRAN) injection 4 mg   Once in ED      Route: Intravenous  Ordered Dose: 4 mg         Last MAR action:  Given Maeson Purohit B                     Medical Decision Making     EKG: Sinus rhythm; negative for ischemic changes. HR 92.    The differential diagnosis includes, but is not limited to ACUTE CORONARY SYNDROME, PULMONARY EMBOLISM, ANEURYSM, DISSECTION, GALL BLADDER, COSTOCHONDRITIS, REFLUX, PNEUMONIA, PNEUMOTHORAX, ESOPHAGEAL INJURY.    R/o ACS.    Labs / imaging were reviewed and explained to the patient. All questions have been answered. Pt is appreciative of care.    Procedures / Critical Care     None     Diagnosis / Disposition       Final Impression:  1. Chest pain    2. Leukocytosis      ED Disposition     Observation Admitting Physician: Myna Hidalgo [[id number]]  Diagnosis: Chest pain [1190359]  Estimated Length of Stay: < 2 midnights  Tentative Discharge Plan?: Home or Self Care [1]  Patient Class: Observation [104]            In addition to the above history, please see nursing notes.  Allergies, meds, past medical, family, social hx, and the results of the diagnostic studies performed have been reviewed.    This is note has been created using an Electronic Medical Record that may contain additions or subtractions not intended by myself, Magdalene Molly, MD.               Magdalene Molly, MD  05/31/13 1525

## 2013-05-26 NOTE — ED Notes (Signed)
Pt states substernal cp that is moving into left side of chest and into shoulder blade

## 2013-05-27 ENCOUNTER — Inpatient Hospital Stay: Payer: BC Managed Care – PPO

## 2013-05-27 DIAGNOSIS — J189 Pneumonia, unspecified organism: Secondary | ICD-10-CM | POA: Diagnosis present

## 2013-05-27 LAB — BASIC METABOLIC PANEL
Anion Gap: 14.8 mMol/L (ref 7.0–18.0)
BUN / Creatinine Ratio: 17.3 Ratio (ref 10.0–30.0)
BUN: 14 mg/dL (ref 7–22)
CO2: 21.3 mMol/L (ref 20.0–30.0)
Calcium: 8.7 mg/dL (ref 8.5–10.5)
Chloride: 107 mMol/L (ref 98–110)
Creatinine: 0.81 mg/dL (ref 0.80–1.30)
EGFR: >60 mL/min/{1.73_m2}
Glucose: 119 mg/dL — ABNORMAL HIGH (ref 70–99)
Osmolality Calc: 279 mOsm/kg (ref 275–300)
Potassium: 4.1 mMol/L (ref 3.5–5.3)
Sodium: 139 mMol/L (ref 136–147)

## 2013-05-27 LAB — CBC
Hematocrit: 43.3 % (ref 39.0–52.5)
Hemoglobin: 14.2 gm/dL (ref 13.0–17.5)
MCH: 28 pg (ref 28–35)
MCHC: 33 gm/dL (ref 32–36)
MCV: 86 fL (ref 80–100)
MPV: 6.5 fL (ref 6.0–10.0)
PLT CT: 272 10*3/uL (ref 130–440)
RBC: 5.01 10*6/uL (ref 4.00–5.70)
RDW: 12.4 % (ref 11.0–14.0)
WBC: 12.5 10*3/uL — ABNORMAL HIGH (ref 4.0–11.0)

## 2013-05-27 LAB — LIPID PANEL
Cholesterol: 201 mg/dL — ABNORMAL HIGH (ref 75–199)
Coronary Heart Disease Risk: 5.15
HDL: 39 mg/dL — ABNORMAL LOW (ref 40–55)
LDL Calculated: 138 mg/dL
Triglycerides: 120 mg/dL (ref 10–150)
VLDL: 24 (ref 0–40)

## 2013-05-27 MED ORDER — TECHNETIUM TC 99M SESTAMIBI - CARDIOLITE
16.5000 | Freq: Once | Status: AC | PRN
Start: 2013-05-27 — End: 2013-05-27
  Administered 2013-05-27: 17 via INTRAVENOUS

## 2013-05-27 MED ORDER — REGADENOSON 0.4 MG/5ML IV SOLN
INTRAVENOUS | Status: AC
Start: 2013-05-27 — End: ?
  Filled 2013-05-27: qty 5

## 2013-05-27 MED ORDER — ALBUTEROL SULFATE HFA 108 (90 BASE) MCG/ACT IN AERS
1.0000 | INHALATION_SPRAY | RESPIRATORY_TRACT | Status: DC | PRN
Start: 2013-05-27 — End: 2021-10-22

## 2013-05-27 MED ORDER — TECHNETIUM TC 99M SESTAMIBI - CARDIOLITE
49.5000 | Freq: Once | Status: AC | PRN
Start: 2013-05-27 — End: 2013-05-27
  Administered 2013-05-27: 50 via INTRAVENOUS

## 2013-05-27 MED ORDER — REGADENOSON 0.4 MG/5ML IV SOLN
0.4000 mg | Freq: Once | INTRAVENOUS | Status: AC
Start: 2013-05-27 — End: 2013-05-27
  Administered 2013-05-27: 0.4 mg via INTRAVENOUS
  Filled 2013-05-27: qty 5

## 2013-05-27 MED ORDER — HYDROCODONE-ACETAMINOPHEN 5-325 MG PO TABS
1.0000 | ORAL_TABLET | ORAL | Status: DC | PRN
Start: 2013-05-27 — End: 2017-12-21

## 2013-05-27 MED ORDER — AZITHROMYCIN 250 MG PO TABS
250.0000 mg | ORAL_TABLET | Freq: Every day | ORAL | Status: DC
Start: 2013-05-27 — End: 2017-12-21

## 2013-05-27 NOTE — Discharge Summary (Signed)
This patient presented to the Observation Unit t today and was initially seen by Physician Assistant Albertine Patricia.  This patient was seen and examined by me and I agree with the management/treatment and plan. Etter Sjogren, MD

## 2013-05-27 NOTE — UM Notes (Signed)
[facility] Utilization Management Review Sheet    NAME: Peter Ramos  MR#: [id number]    CSN#: [id number]    ROOM: 2520/2520-A AGE: 52 y.o.    ADMIT DATE AND TIME: 05/26/2013 11:33 AM      PATIENT CLASS: OBS    ATTENDING PHYSICIAN: Myna Hidalgo, MD  PAYOR:Payor: ANTHEM  Plan: AN[employer]M [employer] [place] PPO  Product Type: *No Product type*        AUTH #:   9/22 [address]-HAS A PPO POLICY- REQ FOR OBS AND STRESS TEST   [id number] NO AUTH NEEDED FOR OBS/ STRESS TEST 23 HR OBS . AFTER 23 HRS   CALL [phone]. REF # [id number].    DIAGNOSIS: Active Problems:   Chest pain   Dyspnea  Chest Pain: Observation Care  OCG: OC-009 (ISC)   Observation Care Admission Criteria  Observation may be appropriate for patient with chest pain and 1 or more of the following:   Suspected cardiac ischemia with nondiagnostic initial evaluation (eg, ECG, cardiac biomarkers) requiring further immediate evaluation such as stress testing, imaging, and repeat laboratory testing to clarify diagnosis   Other suspected diagnosis requiring observation and monitoring during diagnostic evaluation (eg, pulmonary embolus, aortic dissection, pneumothorax, pericarditis, GI bleeding   HISTORY:   Past Medical History   Diagnosis Date   . GERD (gastroesophageal reflux disease)    . OSA (obstructive sleep apnea)      wears cpap at night       DATE OF REVIEW: 05/27/2013    VITALS: BP 110/69  Pulse 83  Temp 97.9 F (36.6 C) (Oral)  Resp 18  Ht 1.753 m (5\' 9" )  Wt 128.6 kg (283 lb 8.2 oz)  BMI 41.85 kg/m2  SpO2 96%    Medications:     Current Facility-Administered Medications   Medication Dose Route Frequency   . [COMPLETED] azithromycin  500 mg Oral Once    Followed by   . azithromycin  250 mg Oral Daily   . cefTRIAXone  1 g Intravenous Q12H SCH   . [COMPLETED] morphine  4 mg Intravenous Once in ED   . [COMPLETED] ondansetron  4 mg Intravenous Once in ED   . pantoprazole  20 mg Oral Daily   . regadenoson  0.4 mg Intravenous Once   . sodium  chloride (PF)  3 mL Intravenous Q8H        Medications  Report       Scheduled           Medication Last Action       azithromycin (ZITHROMAX) tablet 250 mg Given       250 mg, PO, Daily    09/22 1031     cefTRIAXone (ROCEPHIN) 1 g in sodium chloride 0.9 % 100 mL IVPB mini-bag plus New Bag       1 g, IV, Q12H [facility]    09/22 1032     pantoprazole (PROTONIX) EC tablet 20 mg Given       20 mg, PO, Daily    09/22 1031     regadenoson (LEXISCAN) injection 0.4 mg Ordered       0.4 mg, IV, Once          sodium chloride (PF) 0.9 % flush 3 mL Given       3 mL, IV, Q8H    09/22 0927            PRN           Medication  Last Action       acetaminophen (TYLENOL) tablet 650 mg Given       650 mg, PO, Q6H PRN    09/22 1033     LORazepam (ATIVAN) tablet 0.5 mg Ordered       0.5 mg, PO, Q6H PRN          morphine injection 2 mg Given       2 mg, IV, Q4H PRN    09/21 1946     nitroglycerin (NITROSTAT) SL tablet 0.4 mg Ordered       0.4 mg, SL, Q5 Min PRN          zolpidem (AMBIEN) tablet 5 mg Ordered       5 mg, PO, QHS PRN                           Filed Vitals:    05/27/13 1004   BP: 110/69   Pulse: 83   Temp:    Resp: 18   SpO2: 96%           Labs:     Results     Procedure Component Value Units Date/Time    Lipid panel (Fasting) [[id number]]  (Abnormal) Collected:05/27/13 0409    Specimen Information:Blood / Plasma Updated:05/27/13 0449     Cholesterol 201 (H) mg/dL      Triglycerides 956 mg/dL      HDL 39 (L) mg/dL      LDL Calculated 213 mg/dL      Coronary Heart Disease Risk 5.15      VLDL 24     Basic metabolic panel [[id number]]  (Abnormal) Collected:05/27/13 0409    Specimen Information:Blood / Plasma Updated:05/27/13 0449     Sodium 139 mMol/L      Potassium 4.1 mMol/L      Chloride 107 mMol/L      CO2 21.3 mMol/L      CALCIUM 8.7 mg/dL      Glucose 629 (H) mg/dL      Creatinine 5.28 mg/dL      BUN 14 mg/dL      Anion Gap 41.3 mMol/L      BUN/Creatinine Ratio 17.3 Ratio      EGFR >60 mL/min/1.16m2      Osmolality Calc 279  mOsm/kg     CBC [[id number]]  (Abnormal) Collected:05/27/13 0409    Specimen Information:Blood / Blood Updated:05/27/13 0417     WBC 12.5 (H) K/cmm      RBC 5.01 M/cmm      Hemoglobin 14.2 gm/dL      Hematocrit 53.6 %      MCV 86 fL      MCH 28 pg      MCHC 33 gm/dL      RDW 64.4 %      PLT CT 272 K/cmm      MPV 6.5 fL     Cardiac Profile with Troponin [[id number]] Collected:05/26/13 2019    Specimen Information:Plasma Updated:05/26/13 2128     Creatinine Kinase MB (CKMB) 0.4 ng/mL      Creatine Kinase (CK) 76 U/L      Troponin I <0.01 ng/mL      CKMB Index NI %     Urine Culture [[id number]] Collected:05/26/13 1726    Specimen Information:Urine, Random Updated:05/26/13 2045    Narrative:    Specimen: Urine, Random  Collected: 05/26/2013 17:26     Status: Valued      Last Updated:  05/26/2013 20:44                Culture Result (Prelim)      Culture In Progress          Urinalysis with Culture Reflex [[id number]]  (Abnormal) Collected:05/26/13 1726    Specimen Information:Urine, Random Updated:05/26/13 1759     Color, UA Yellow      Clarity, UA Clear      Specific Gravity, UR 1.018      pH, Urine 6.5 pH      Protein, UR Negative mg/dL      Glucose, UA Negative mg/dL      Ketones UA Trace mg/dL      Bilirubin, UA Negative mg/dL      Blood, UA Negative mg/dL      Nitrite, UA Negative      Urobilinogen, UA Normal mg/dL      Leukocyte Esterase, UA 75 (A) Leu/uL      WBC, UA 7 (H) /hpf      RBC, UA 1 /hpf      Bacteria, UA Rare (A) /hpf      Squam Epithel, UA 2 /hpf     Narrative:    A Urine Culture has been ordered based upon the Positive UA results.    Cardiac Profile with Troponin [[id number]] Collected:05/26/13 1533    Specimen Information:Plasma Updated:05/26/13 1609     Creatinine Kinase MB (CKMB) 0.6 ng/mL      Creatine Kinase (CK) 80 U/L      Troponin I <0.01 ng/mL      CKMB Index NI %     B-type Natriuretic Peptide [[id number]] Collected:05/26/13 1150    Specimen Information:Blood / Blood Updated:05/26/13 1405      B-Natriuretic Peptide <10.0 pg/mL     D-dimer, quantitative [[id number]]  (Abnormal) Collected:05/26/13 1150    Specimen Information:Blood / Blood Updated:05/26/13 1359     D-Dimer <0.19 (L) mg/L FEU     Basic Metabolic Panel [[id number]]  (Abnormal) Collected:05/26/13 1150    Specimen Information:Blood / Plasma Updated:05/26/13 1225     Sodium 141 mMol/L      Potassium 4.0 mMol/L      Chloride 109 mMol/L      CO2 23.4 mMol/L      CALCIUM 9.6 mg/dL      Glucose 403 (H) mg/dL      Creatinine 4.74 mg/dL      BUN 15 mg/dL      Anion Gap 25.9 mMol/L      BUN/Creatinine Ratio 17.6 Ratio      EGFR >60 mL/min/1.37m2      Osmolality Calc 283 mOsm/kg     CBC and differential [[id number]]  (Abnormal) Collected:05/26/13 1150    Specimen Information:Blood / Blood Updated:05/26/13 1223     WBC 14.4 (H) K/cmm      RBC 5.38 M/cmm      Hemoglobin 15.3 gm/dL      Hematocrit 32.9 %      MCV 86 fL      MCH 29 pg      MCHC 33 gm/dL      RDW 51.8 %      PLT CT 295 K/cmm      MPV 6.8 fL      NEUTROPHIL % 75.3 %      Lymphocytes 14.1 (L) %      Monocytes 9.0 %      Eosinophils % 1.5 %      Basophils % 0.1 %  Neutrophils Absolute 10.8 (H) K/cmm      Lymphocytes Absolute 2.0 K/cmm      Monocytes Absolute 1.3 K/cmm      Eosinophils Absolute 0.2 K/cmm      BASO Absolute 0.0 K/cmm     i-Stat Troponin [[id number]] Collected:05/26/13 1204    Specimen Information:Blood Updated:05/26/13 1216     Trop I, ISTAT <0.02 ng/mL     I-Stat Troponin Notification [[id number]] Collected:05/26/13 1153    Specimen Information:ISTAT Updated:05/26/13 1204     I-STAT Notification Istat Notification               Lab Results   Component Value Date    WBC 12.5* 05/27/2013    HGB 14.2 05/27/2013    PLT 272 05/27/2013    NA 139 05/27/2013    K 4.1 05/27/2013    BUN 14 05/27/2013    CREAT 0.81 05/27/2013    BNP <10.0 05/26/2013    LDL 138 05/27/2013       Rads:     maging:   XR CHEST AP PORTABLE     Final Result:  Left basilar linear atelectasis.          ReadingStation:[id number]       EKG: Sinus rhythm; negative for ischemic changes. HR 92.          MD NOTE:    a 52 y.o. male who presented with sternal and left anterior chest pain since waking this morning. The pain has been present for the entire day. At its worst rated as 8/10 and currently a 5/10 after getting morphine in the ED. The pain is associated with SOB. It is positional in nature. It does not worsen with exertion. Pt notes worsening pain with laying supine. Sitting up improves the pain. He denies recent fever, chills, or malaise. He has a hx of recurrent ear infections and notes his ears "have been bothering me for a few days". He denies sore throat or runny nose. Denies significant coughing or wheezing. He also notes that he has had "strong" urine intermittently for several weeks. Denies hematuria, frequency, urgency, or dysuria.   Pt was evaluated in the ED with WBC count of 14.4, BNP <10, d-dimer <0.19, trop <0.02, and CXR indicating left basilar linear atelectasis. He has PMH of OSA and GERD. He had a stress test several years ago that was normal.   Obs consultation was requested, the pt was evaluated, and subsequently transferred to the obs unit.     EMERGENCY DEPARTMENT COURSE      ED Medication Orders      Start    Status  Ordering Provider      05/26/13 1335    morphine injection 4 mg Once in ED   Last MAR action: Given  PIANALTO, DAVID B         Route: Intravenous Ordered Dose: 4 mg                    05/26/13 1335    ondansetron (ZOFRAN) injection 4 mg Once in ED   Last MAR action:          Route: Intravenous Ordered Dose: 4 mg                    ASSESSMENT & PLAN     a 52 y.o. male admitted under OBSERVATION with Chest Pain and Dyspnea.   Assessment & Plan:   1. Chest Pain/Dyspnea   Admit to Observation Unit for  cardiac work up.   -Telemetry   -Serial cardiac enzymes.   -Repeat ECG   -Fasting Lipid Panel in AM   -Nitrates for pain as needed   -Nuclear Stress Test In AM   -Cardiac Consultation PRN  2. Left Basilar  Linear Atelectasis/Leukocytosis   Will start IV Rocephin and PO azithromycin   Consider repeat CXR   Antipyretics prn    9/22 STRESS TEST RESULTS PENDING.

## 2013-05-27 NOTE — Discharge Instructions (Signed)
Chest Pain, Noncardiac    Based on your visit today, the exact cause of your chest pain is not certain. Your condition does not seem serious and your pain does not appear to be coming from your heart. However, sometimes the signs of a serious problem take more time to appear. Therefore, please watch for the warning signs listed below.  Home Care:  1. Rest today and avoid strenuous activity.  2. Take any prescribed medicine as directed.  Follow Up  with your doctor or this facility as instructed or if you do not start to feel better within 24 hours.  Get Prompt Medical Attention  if any of the following occur:   A change in the type of pain: if it feels different, becomes more severe, lasts longer, or begins to spread into your shoulder, arm, neck, jaw or back   Shortness of breath or increased pain with breathing   Cough with dark colored sputum (phlegm) or blood   Weakness, dizziness, or fainting   Fever of 100.4F (38C) or higher, or as directed by your healthcare provider   Swelling, pain or redness in one leg   2000-2014 [employer], [address], [place] [address]. All rights reserved. This information is not intended as a substitute for professional medical care. Always follow your healthcare professional's instructions.      Pneumonia (Adult)  Pneumonia is an infection deep within the lung, in the small air sacs (alveoli). It may be due to a virus or bacteria and is usually treated with an antibiotic. Severe cases require treatment in the hospital. Milder cases can be treated at home. Symptoms usually start to improve during the first2 days of treatment.    Home Care:   Rest at home for the first 2-3 days or until you feel stronger. When resuming activity, don't let yourself become overly tired.   Avoid exposure to cigarette smoke (yours or others).   You may use acetaminophen (Tylenol) or ibuprofen (Motrin, Advil) to control fever or pain, unless another medicine was prescribed.  [NOTE: If you have chronic liver or kidney disease or ever had a stomach ulcer or GI bleeding, talk with your doctor before using these medicines.] (Aspirin should never be used in anyone under 18 years of age who is ill with a fever. It may cause severe liver damage.)   Your appetite may be poor so a light diet is fine.   Keep well hydrated by drinking 6-8 glasses of fluids per day (water, sport drinks such as Gatorade, sodas without caffeine, juices, tea, soup, etc.). This will help loosen secretions in the lung, making it easier for you to cough up the phlegm (sputum). If you also have heart or kidney disease, check with your doctor before you drink extra amounts of fluids.   Finish all antibiotic medicine prescribed, even if you are feeling better after a few days.  Follow Up  with your doctor in the next 2-3 days (or as advised) to be sure you are responding properly to the medicine.  [NOTE: If you are age 65 or older, or if you have chronic lung disease (asthma, emphysema or COPD), we recommendthe pneumococcal vaccination and a yearlyinfluenzavaccination(flu-shot) every autumn. Ask your doctor about this.]  Get Prompt Medical Attention  if any of the following occur:   Not getting better within the first 48 hours of treatment   Increasing shortness of breath or rapid breathing (over 25 breaths/minute)   Coughing up blood or increasing chest   pain with breathing   Fever of 100.39F (38C) oral or higher, not better with fever medication   Increasing weakness, dizziness or fainting   Increasing thirst or dry mouth   Sinus pain, headache or a stiff neck   Chest pain not caused by coughing   [address], [address], [place], [place] [address]. All rights reserved. This information is not intended as a substitute for professional medical care. Always follow your healthcare professional's instructions.    Thank you for choosing [facility] for your emergency care needs.  We strive to provide EXCELLENT care to you and your family.      YOUR ACCURATE CONTACT INFORMATION IS VERY IMPORTANT    Before leaving please check with registration to make sure we have an up-to-date contact number. A Toll-free post discharge customer service number is available to update your registration/insurance information as well as answer any billing questions or concerns. That number is [phone].       IF YOU DO NOT CONTINUE TO IMPROVE OR YOUR CONDITION WORSENS, PLEASE CONTACT YOUR DOCTOR OR RETURN IMMEDIATELY TO THE EMERGENCEY DEPARTMENT.      EXTRA AVAILABLE RESOURCES:    1. DOCTOR REFERRALS  a. Call  our Physician Referral Line @ [phone] If you need  further assistance with referrals to primary care or specialty services our Case Manager may assist at [phone].  2. FREE HEALTH SERVICES  a. www.freemedicalsearch.org  b. http://www.[facility].org  May be utilized if you need help with health or social services, please call 2-1-1 for a free referral to resources in your area. 2-1-1 is a free service connecting people with information on health insurance, free clinics, pregnancy, mental health, dental care, food assistance, housing, and substance abuse counseling.  3. MEDICAL RECORDS AND TESTS  Certain laboratory test results do not come back the same day, for example: urine cultures may take 3 days. We will attempt to contact you if other important findings are noted. Some lab testing may take 2-5 days. Radiology films are reviewed again to ensure accuracy. If there is any discrepancy, we will notify you. If you have questions or concerns, our Case Manager is available Monday thru Friday, 7:30a-3:00p, for follow up or test result questions. Please call [phone].  4. ED PATIENT ADVOCATE SERVICES: [phone]. Contact for general questions and concerns, daily 11:00a-11:00p.      DISCHARGE MESSAGE:     YOU ARE THE MOST IMPORTANT FACTOR IN YOUR RECOVERY. Follow   the above  instructions carefully. Take your medicines as prescribed. Most   important, see your  doctor in follow-up  as recommended by your ED physician.   Call your doctor if your condition worsens or if you have any new, worsening, or   severe symptoms. If you need further care and cannot be seen by your    recommended follow-up doctor, the Emergency Department Case Manager will   be available to help as needed [phone].  If you require immediate    assistance, return to the Emergency Department or call 911.    Pharmacy information  For your convenience please visit [facility] for your prescription needs. [facility] has extended evening and weekend hours, including holidays, to ensure patients can begin taking medications as soon as possible.  Most insurance plans are accepted.    Pharmacy Hours:  Monday - Friday: 8:30a to 8:30p  Saturday: 9a to 1p  Sunday: 9a to 1p and 6p to 9p  Holidays: 9a to  providing home care solutions for independent living since 1984. Servicing [place] and [place]. [facility] is a full service home medical provider of home oxygen and respiratory care, medical equipment and supplies. [phone].    Thanks Again, for allowing [facility]   Emergency Department to serve you.

## 2013-05-27 NOTE — Progress Notes (Signed)
Nuclear stress test - 8:00 min; 10.1 mets and 89% thr; st elevation in all leads at rest - asymptomatic (possible pericarditis?): no st changes; 6/10 chest pressure with exercise; 3/10 in recovery; one pvc; sestamibi [place] [address]

## 2013-05-27 NOTE — Discharge Summary (Signed)
[facility]      Patient: Peter Ramos  Admission Date: 05/26/2013   DOB: Mar 16, 1961  Discharge Date: 05/27/2013    MRN: [id number]  Discharge Attending: Birdena Jubilee MD   Referring Physician: Edmon Crape, MD  PCP: Edmon Crape, MD       DISCHARGE SUMMARY     Discharge Information   Admission Diagnosis:   Chest Pain  Dyspnea     Discharge Diagnosis:   Patient Active Problem List    Diagnosis Date Noted   . Pneumonia 05/27/2013   . Chest pain 05/26/2013   . Dyspnea 05/26/2013        Discharge Medications:     Medication List       As of 05/27/2013  1:23 PM      START taking these medications           albuterol 108 (90 BASE) MCG/ACT inhaler    Commonly known as: PROVENTIL HFA;VENTOLIN HFA    Inhale 1-2 puffs into the lungs every 4 (four) hours as needed for Wheezing or Shortness of Breath (cough).        azithromycin 250 MG tablet    Commonly known as: ZITHROMAX    Take 1 tablet (250 mg total) by mouth daily.        HYDROcodone-acetaminophen 5-325 MG per tablet    Commonly known as: NORCO    Take 1 tablet by mouth every 4 (four) hours as needed for Pain.         CONTINUE taking these medications           pantoprazole 20 MG tablet    Commonly known as: PROTONIX               Where to get your medications     These are the prescriptions that you need to pick up.    You may get these medications from any pharmacy.           albuterol 108 (90 BASE) MCG/ACT inhaler    azithromycin 250 MG tablet             Information on where to get these meds is not yet available. Ask your nurse or doctor.           HYDROcodone-acetaminophen 5-325 MG per tablet                       Hospital Course   Presentation History   Pt presented to ED yesterday following sudden onset of left sided CP, SOB, and pain with inspiration.  He was evaluated in the ED with negative d-dimer.  He had a leukocytosis and left lingular atelectasis.  He denied any significant cough, but did note some recent congestion.  No fever  or chills.  He was admitted observation to rule out cardiac etiology of his CP.      Hospital Course (1 Days)   Pt did well overnight, notes near resolution of his CP.  He did have dry cough overnight.  Denies SOB.  He did not note any fever or chills overnight.  He has had a good appetite, without N/V/D or abdominal pain.  He ruled out with enzymes and was noted to have normal stress test.  He is in stable condition for D/C.  He will be D/C on z-pak to cover for probably atypical pneumonia, due to his elevated WBC count and left lingular atelectasis on CXR.  Procedures/Imaging:   XR CHEST AP PORTABLE    Final Result: Left basilar linear atelectasis.         ReadingStation:[id number]   NM MYOCARDIAL PERFUSION SPECT (STRESS AND REST)    (Results Pending)   CV CARDIAC STRESS TEST TRACING ONLY    (Results Pending)       Treatment Team:   Attending Provider: Myna Hidalgo, MD       Progress Note/Physical Exam at Discharge   Filed Vitals:    05/26/13 1936 05/26/13 2335 05/27/13 0405 05/27/13 1004   BP:  115/83 124/83 110/69   Pulse:    83   Temp: 99.1 F (37.3 C) 98.1 F (36.7 C) 97.9 F (36.6 C)    TempSrc: Oral Oral Oral    Resp:    18   Height:       Weight:       SpO2:    96%       Constitutional: Vital signs reviewed. Patient appears comfortable.  Eyes: Orbits, eyelids, conjunctiva and sclera appear normal.  ENMT: Normal appearance of ears/nose. Mucous membranes moist.  Respiratory: Lungs are clear bilaterally. No work of breathing.  Cardiovascular: Heart regular rate and rhythm. No leg edema bilaterally.  GI/GU: Abdomen is soft, nontender; no guarding or rigidity.  Skin: Skin is warm and dry.  Neurologic: Awake and alert. Memory intact.  Psychiatric: Normal affect and insight; no agitation.  Musculoskeletal: Neck is supple. Trachea is midline. No lymphadenopathy.       Diagnostics     Stress Test Results: Negative per Dr. Romeo Apple    Labs/Studies Pending at Discharge: Yes, urine culture pending    Last  Labs     Lab 05/27/13 0409 05/26/13 1150   WBC 12.5* 14.4*   RBC 5.01 5.38   HGB 14.2 15.3   HCT 43.3 46.4   MCV 86 86   PLT 272 295         Lab 05/27/13 0409 05/26/13 1150   NA 139 141   K 4.1 4.0   CL 107 109   CO2 21.3 23.4   BUN 14 15   CREAT 0.81 0.85   GLU 119* 113*   CA 8.7 9.6   MG -- --        Patient Instructions   Discharge Diet: regular diet  Discharge Activity:  activity as tolerated, rest and increase fluid intake    Follow Up Appointment:  Follow-up Information     Follow up with Edmon Crape, MD. Schedule an appointment as soon as possible for a visit in 1 week.    Contact information:    [address]  [place] [address]  [phone]          Follow up with [facility]. (As needed if symptoms worsen)     Contact information:    [address]  [place] [address]  [phone]               Time spent examining patient, discussing with patient/family regarding hospital course, chart review, reconciling medications and discharge planning: 30 minutes.      Blondell Reveal [place], [place]    1:23 PM 05/27/2013

## 2013-06-10 ENCOUNTER — Ambulatory Visit (INDEPENDENT_AMBULATORY_CARE_PROVIDER_SITE_OTHER): Payer: BC Managed Care – PPO | Attending: Family Medicine

## 2013-06-10 ENCOUNTER — Ambulatory Visit
Admission: RE | Admit: 2013-06-10 | Discharge: 2013-06-10 | Disposition: A | Discharge status: Home / Self Care | Payer: BC Managed Care – PPO | Source: Ambulatory Visit | Attending: Gastroenterology | Admitting: Gastroenterology

## 2013-06-10 ENCOUNTER — Other Ambulatory Visit: Payer: Self-pay | Admitting: Gastroenterology

## 2013-06-10 DIAGNOSIS — K620 Anal polyp: Secondary | ICD-10-CM | POA: Insufficient documentation

## 2013-06-10 DIAGNOSIS — K621 Rectal polyp: Secondary | ICD-10-CM | POA: Insufficient documentation

## 2013-06-21 ENCOUNTER — Encounter: Payer: Self-pay | Admitting: Family Medicine

## 2013-06-21 ENCOUNTER — Ambulatory Visit (INDEPENDENT_AMBULATORY_CARE_PROVIDER_SITE_OTHER): Payer: PRIVATE HEALTH INSURANCE | Admitting: Family Medicine

## 2013-06-21 VITALS — BP 140/66 | HR 87 | Temp 98.6°F | Resp 16 | Wt 254.0 lb

## 2013-06-21 DIAGNOSIS — J069 Acute upper respiratory infection, unspecified: Secondary | ICD-10-CM

## 2013-06-21 MED ORDER — BENZONATATE 200 MG PO CAPS: 200.0000 mg | ORAL_CAPSULE | Freq: Three times a day (TID) | ORAL | Status: DC | PRN

## 2013-06-21 MED ORDER — PROMETHAZINE-DM 6.25-15 MG/5ML PO SYRP: 5.0000 mL | ORAL_SOLUTION | Freq: Four times a day (QID) | ORAL | Status: DC | PRN

## 2013-06-21 NOTE — Progress Notes (Signed)
  Subjective:    Patient ID: Nathan Kelley, male    DOB: Dec 28, 1960, 52 y.o.   MRN: [id number]  HPI URI- pt notes deep barking cough, 'like croup' that started 2 days ago.  Some R ear pain.  Subjective temp this AM.  No facial pain/pressure.  + sore throat.  No N/V/D.  No known sick contacts.   Review of Systems For ROS see HPI     Objective:   Physical Exam  Vitals reviewed. Constitutional: He appears well-developed and well-nourished. No distress.  HENT:  Head: Normocephalic and atraumatic.  Right Ear: Tympanic membrane normal.  Left Ear: Tympanic membrane normal.  Nose: No mucosal edema or rhinorrhea. Right sinus exhibits no maxillary sinus tenderness and no frontal sinus tenderness. Left sinus exhibits no maxillary sinus tenderness and no frontal sinus tenderness.  Mouth/Throat: Mucous membranes are normal. No oropharyngeal exudate, posterior oropharyngeal edema or posterior oropharyngeal erythema.  Eyes: Conjunctivae and EOM are normal. Pupils are equal, round, and reactive to light.  Neck: Normal range of motion. Neck supple.  Cardiovascular: Normal rate, regular rhythm and normal heart sounds.   Pulmonary/Chest: Effort normal and breath sounds normal. No respiratory distress. He has no wheezes.  + hacking cough  Lymphadenopathy:    He has no cervical adenopathy.  Skin: Skin is warm and dry.          Assessment & Plan:

## 2013-06-21 NOTE — Patient Instructions (Signed)
This is a combo of seasonal allergies and a viral illness Drink plenty of fluids Add Claritin/Zyrtec daily until the first frost Use the cough syrup nightly- will cause drowsiness Use the cough pills for daytime cough- will not cause drowsiness REST! Hang in there! Happy Early [place] Ouch!!!

## 2013-06-23 NOTE — Assessment & Plan Note (Addendum)
New.  No evidence of bacterial infxn- no need for abx.  Start cough meds prn.  Reviewed supportive care and red flags that should prompt return.  Pt expressed understanding and is in agreement w/ plan.  

## 2013-07-03 ENCOUNTER — Other Ambulatory Visit: Payer: Self-pay | Admitting: Internal Medicine

## 2013-07-03 NOTE — Telephone Encounter (Signed)
Metoprolol refill sent to pharmacy

## 2013-07-11 ENCOUNTER — Other Ambulatory Visit: Payer: Self-pay

## 2013-07-15 ENCOUNTER — Telehealth: Payer: Self-pay | Admitting: *Deleted

## 2013-07-15 ENCOUNTER — Other Ambulatory Visit: Payer: Self-pay | Admitting: Internal Medicine

## 2013-07-15 DIAGNOSIS — Z1211 Encounter for screening for malignant neoplasm of colon: Secondary | ICD-10-CM

## 2013-07-15 NOTE — Telephone Encounter (Signed)
Referral entered  

## 2013-07-15 NOTE — Telephone Encounter (Signed)
Patient called and would like a recommendation on where he should go to get a colonoscopy. Please advise.

## 2013-07-23 ENCOUNTER — Other Ambulatory Visit: Payer: Self-pay | Admitting: Internal Medicine

## 2013-07-23 NOTE — Telephone Encounter (Signed)
Metoprolol refilled per protocol 

## 2013-08-16 ENCOUNTER — Encounter: Payer: Self-pay | Admitting: Internal Medicine

## 2013-09-10 ENCOUNTER — Ambulatory Visit (INDEPENDENT_AMBULATORY_CARE_PROVIDER_SITE_OTHER): Payer: PRIVATE HEALTH INSURANCE | Admitting: Internal Medicine

## 2013-09-10 ENCOUNTER — Encounter: Payer: Self-pay | Admitting: Internal Medicine

## 2013-09-10 VITALS — BP 131/69 | HR 65 | Temp 98.0°F | Resp 16 | Wt 257.0 lb

## 2013-09-10 DIAGNOSIS — R1011 Right upper quadrant pain: Secondary | ICD-10-CM

## 2013-09-10 DIAGNOSIS — R319 Hematuria, unspecified: Secondary | ICD-10-CM

## 2013-09-10 LAB — POCT URINALYSIS DIPSTICK
BILIRUBIN UA: NEGATIVE
Glucose, UA: NEGATIVE
KETONES UA: NEGATIVE
Leukocytes, UA: NEGATIVE
Nitrite, UA: NEGATIVE
PH UA: 6
PROTEIN UA: NEGATIVE
SPEC GRAV UA: 1.02
Urobilinogen, UA: 0.2

## 2013-09-10 MED ORDER — TRAMADOL HCL 50 MG PO TABS: 50.0000 mg | ORAL_TABLET | Freq: Three times a day (TID) | ORAL | Status: DC | PRN

## 2013-09-10 NOTE — Progress Notes (Signed)
Pre-visit discussion using our clinic review tool. No additional management support is needed unless otherwise documented below in the visit note.  

## 2013-09-10 NOTE — Addendum Note (Signed)
Addended by: Verdie ShireBAYNES, ANGELA M on: 09/10/2013 05:31 PM   Modules accepted: Orders

## 2013-09-10 NOTE — Patient Instructions (Signed)
Please complete and return stool cards; these will determine whether there is any gastrointestinal bleeding risk such as colitis.

## 2013-09-10 NOTE — Progress Notes (Signed)
   Subjective:    Patient ID: Nathan MuldersKenneth R Paynter, male    DOB: May 17, 1961, 53 y.o.   MRN: [id number]  HPI  Symptoms began one month ago as dull intermittent right upper quadrant discomfort. Within the last 2 weeks it's been constant, up to a level II-3. It does radiate inferiorly approximately 6 inches at times. He finds that sitting forward aggravates his symptoms while extending the back decreases the discomfort. No food or activity triggers noted.  Unrelated to this pain has been intermittent dysphagia.  Past history includes colonoscopy 1989 for severe diarrhea.  2 brothers have had colitis. His paternal grandmother had ulcers. No FH GB disease.  He drinks 2 glasses of wine per day. He takes one aspirin daily. He drinks decaf coffee; he rarely ingests tea or cola.    Review of Systems  He specifically denies nausea, vomiting, constipation, diarrhea, melena, or rectal bleeding.  He is not having significant dyspepsia, anorexia, or hematemesis. Weight is stable.  There is no associated fever, chills, or sweats  Dysuria, pyuria, hematuria not present  There's been no change in the temperature or color of the skin in the area of discomfort.  There is no associated back pain which radiates anteriorly.     Objective:   Physical Exam General appearance is one of good health and nourishment w/o distress.  Eyes: No conjunctival inflammation or scleral icterus is present.  Oral exam: Dental hygiene is good; lips and gums are healthy appearing.There is mild oropharyngeal erythema ; no exudate noted.   Heart:  Normal rate and regular rhythm. S1 and S2 normal without gallop, murmur, click, rub or other extra sounds     Lungs:Chest clear to auscultation; no wheezes, rhonchi,rales ,or rubs present.No increased work of breathing.   Abdomen: bowel sounds normal, soft and non-tender without masses, organomegaly or hernias noted.  No guarding or rebound even with percussion over RUQ. No  tenderness over the flanks to percussion  Musculoskeletal: Able to lie flat and sit up without help. Negative straight leg raising bilaterally. Gait normal  Skin:Warm & dry.  Intact without suspicious lesions or rashes ; no jaundice or tenting  Lymphatic: No lymphadenopathy is noted about the head, neck, axilla                Assessment & Plan:  #1RUQ  Pain See orders

## 2013-09-11 LAB — CBC WITH DIFFERENTIAL/PLATELET
BASOS PCT: 0.2 % (ref 0.0–3.0)
Basophils Absolute: 0 10*3/uL (ref 0.0–0.1)
EOS PCT: 1.3 % (ref 0.0–5.0)
Eosinophils Absolute: 0.1 10*3/uL (ref 0.0–0.7)
HCT: 49.1 % (ref 39.0–52.0)
HEMOGLOBIN: 17 g/dL (ref 13.0–17.0)
LYMPHS PCT: 27.6 % (ref 12.0–46.0)
Lymphs Abs: 2.5 10*3/uL (ref 0.7–4.0)
MCHC: 34.6 g/dL (ref 30.0–36.0)
MCV: 93.5 fl (ref 78.0–100.0)
MONOS PCT: 10.3 % (ref 3.0–12.0)
Monocytes Absolute: 0.9 10*3/uL (ref 0.1–1.0)
NEUTROS ABS: 5.4 10*3/uL (ref 1.4–7.7)
Neutrophils Relative %: 60.6 % (ref 43.0–77.0)
Platelets: 272 10*3/uL (ref 150.0–400.0)
RBC: 5.25 Mil/uL (ref 4.22–5.81)
RDW: 13.1 % (ref 11.5–14.6)
WBC: 8.9 10*3/uL (ref 4.5–10.5)

## 2013-09-11 LAB — HEPATIC FUNCTION PANEL
ALBUMIN: 4.5 g/dL (ref 3.5–5.2)
ALK PHOS: 56 U/L (ref 39–117)
ALT: 59 U/L — ABNORMAL HIGH (ref 0–53)
AST: 45 U/L — AB (ref 0–37)
Bilirubin, Direct: 0.1 mg/dL (ref 0.0–0.3)
TOTAL PROTEIN: 7.5 g/dL (ref 6.0–8.3)
Total Bilirubin: 0.7 mg/dL (ref 0.3–1.2)

## 2013-09-11 LAB — LIPASE: Lipase: 34 U/L (ref 11.0–59.0)

## 2013-09-11 LAB — AMYLASE: Amylase: 26 U/L — ABNORMAL LOW (ref 27–131)

## 2013-09-12 LAB — URINE CULTURE
COLONY COUNT: NO GROWTH
Organism ID, Bacteria: NO GROWTH

## 2013-09-16 ENCOUNTER — Ambulatory Visit
Admission: RE | Admit: 2013-09-16 | Discharge: 2013-09-16 | Disposition: A | Discharge status: Home / Self Care | Payer: PRIVATE HEALTH INSURANCE | Source: Ambulatory Visit | Attending: Internal Medicine | Admitting: Internal Medicine

## 2013-09-16 DIAGNOSIS — R1011 Right upper quadrant pain: Secondary | ICD-10-CM

## 2013-09-17 ENCOUNTER — Telehealth: Payer: Self-pay | Admitting: *Deleted

## 2013-09-17 NOTE — Telephone Encounter (Signed)
Spoke with patient and made aware of lab and u/s results. Advised to limit intake of ETOH and tylenol. Advised to call the office if pain persists or gets worse.

## 2013-09-17 NOTE — Telephone Encounter (Signed)
Patient left message on voice mail concerning results from GB scan. Left message on voice mail for the patient to return my call.

## 2013-09-18 ENCOUNTER — Other Ambulatory Visit (INDEPENDENT_AMBULATORY_CARE_PROVIDER_SITE_OTHER): Payer: PRIVATE HEALTH INSURANCE

## 2013-09-18 DIAGNOSIS — R1011 Right upper quadrant pain: Secondary | ICD-10-CM

## 2013-09-18 LAB — POC HEMOCCULT BLD/STL (HOME/3-CARD/SCREEN)
Card #2 Fecal Occult Blod, POC: NEGATIVE
FECAL OCCULT BLD: NEGATIVE
FECAL OCCULT BLD: NEGATIVE

## 2013-10-21 ENCOUNTER — Telehealth: Payer: Self-pay

## 2013-10-21 NOTE — Telephone Encounter (Signed)
Patient left voice mail for call back. Would like copy of lab results from January. Mailed copy.

## 2014-06-23 ENCOUNTER — Telehealth: Payer: Self-pay | Admitting: Internal Medicine

## 2014-06-23 MED ORDER — METOPROLOL TARTRATE 25 MG PO TABS: ORAL_TABLET | ORAL | Status: DC

## 2014-06-23 NOTE — Telephone Encounter (Signed)
Pt request refill of BP meds. Pt only hs 3 pills left. Pharmacy advised pt to call PCP. [facility]

## 2014-06-23 NOTE — Telephone Encounter (Signed)
[facility] [address]

## 2014-06-25 ENCOUNTER — Ambulatory Visit (INDEPENDENT_AMBULATORY_CARE_PROVIDER_SITE_OTHER): Payer: PRIVATE HEALTH INSURANCE | Admitting: Internal Medicine

## 2014-06-25 ENCOUNTER — Other Ambulatory Visit (INDEPENDENT_AMBULATORY_CARE_PROVIDER_SITE_OTHER): Payer: PRIVATE HEALTH INSURANCE

## 2014-06-25 ENCOUNTER — Encounter: Payer: Self-pay | Admitting: Internal Medicine

## 2014-06-25 VITALS — BP 144/80 | HR 73 | Temp 98.1°F | Resp 14 | Wt 251.0 lb

## 2014-06-25 DIAGNOSIS — R5383 Other fatigue: Secondary | ICD-10-CM

## 2014-06-25 DIAGNOSIS — I10 Essential (primary) hypertension: Secondary | ICD-10-CM

## 2014-06-25 DIAGNOSIS — G479 Sleep disorder, unspecified: Secondary | ICD-10-CM | POA: Insufficient documentation

## 2014-06-25 LAB — CBC WITH DIFFERENTIAL/PLATELET
BASOS ABS: 0 10*3/uL (ref 0.0–0.1)
Basophils Relative: 0.1 % (ref 0.0–3.0)
EOS ABS: 0.2 10*3/uL (ref 0.0–0.7)
Eosinophils Relative: 2.1 % (ref 0.0–5.0)
HEMATOCRIT: 48 % (ref 39.0–52.0)
HEMOGLOBIN: 16.3 g/dL (ref 13.0–17.0)
LYMPHS ABS: 2.3 10*3/uL (ref 0.7–4.0)
LYMPHS PCT: 25.4 % (ref 12.0–46.0)
MCHC: 34 g/dL (ref 30.0–36.0)
MCV: 92.4 fl (ref 78.0–100.0)
MONOS PCT: 8.6 % (ref 3.0–12.0)
Monocytes Absolute: 0.8 10*3/uL (ref 0.1–1.0)
NEUTROS ABS: 5.9 10*3/uL (ref 1.4–7.7)
Neutrophils Relative %: 63.8 % (ref 43.0–77.0)
PLATELETS: 243 10*3/uL (ref 150.0–400.0)
RBC: 5.19 Mil/uL (ref 4.22–5.81)
RDW: 13.6 % (ref 11.5–15.5)
WBC: 9.2 10*3/uL (ref 4.0–10.5)

## 2014-06-25 MED ORDER — CLONAZEPAM 0.5 MG PO TABS: ORAL_TABLET | ORAL | Status: DC

## 2014-06-25 NOTE — Progress Notes (Signed)
   Subjective:    Patient ID: Nathan Kelley, male    DOB: 1961-03-19, 53 y.o.   MRN: [id number]  HPI   He describes fatigue for one month.This is in context of sleeping poorly. He will fall asleep watching TV @ 11 PM or later. He'll go to bed but not be able to fall asleep.He describes not being able to shut his mind off. Clonazepam was never taken.  He has used over-the-counter medications such as NyQuil but these cause him to be "jittery".  He has slight snoring but no sleep apnea.  On 10/18 he had lightheadedness standing up. Blood pressure was 130/83-143/86. He realized he had only been taking his beta blocker in the morning ,not twice a day  He is on a heart healthy diet and does not add salt  He exercises 3-4 times per week for 30-40 minutes as cardio plus free weights without cardiopulmonary symptoms      Review of Systems   Chest pain, palpitations, tachycardia, exertional dyspnea, paroxysmal nocturnal dyspnea, claudication or edema are absent.  Unexplained weight loss, abdominal pain, significant dyspepsia, dysphagia, melena, rectal bleeding, or persistently small caliber stools are denied.     Objective:   Physical Exam   Positive or pertinent findings include: Pattern alopecia is present He has a goatee. There some protuberance of the abdomen. Neurologic exam including Romberg, finger-nose, heel/toe walking are all normal. Strength, tone, and reflexes also normal.  General appearance :adequately nourished; in no distress. Eyes: No conjunctival inflammation or scleral icterus is present. Oral exam: Dental hygiene is good. Lips and gums are healthy appearing.There is no oropharyngeal erythema or exudate noted.  Neck: no masses, normal thyroid Heart:  Normal rate and regular rhythm. S1 and S2 normal without gallop, murmur, click, rub or other extra sounds   Lungs:Chest clear to auscultation; no wheezes, rhonchi,rales ,or rubs present.No increased work of  breathing.  Abdomen: bowel sounds normal, soft and non-tender without masses, organomegaly or hernias noted.  No guarding or rebound.  Vascular : all pulses equal ; no bruits present. Skin:Warm & dry.  Intact without suspicious lesions or rashes ; no jaundice or tenting Lymphatic: No lymphadenopathy is noted about the head, neck, axilla           Assessment & Plan:  #1 HTN #2 fatigue  #3 sleep disorder See orders & AVS Sleep hygiene Referral to Sleep Specialist if no better with trial of Clonazepam if he awakens

## 2014-06-25 NOTE — Progress Notes (Signed)
Pre visit review using our clinic review tool, if applicable. No additional management support is needed unless otherwise documented below in the visit note. 

## 2014-06-25 NOTE — Patient Instructions (Addendum)
To prevent sleep dysfunction follow these instructions for sleep hygiene. Do not read, watch TV, or eat in bed. Do not get into bed until you are ready to turn off the light &  to go to sleep. Do not ingest stimulants ( decongestants, diet pills, nicotine, caffeine) after the evening meal.Do not take daytime naps. .Minimal Blood Pressure Goal= AVERAGE < 140/90;  Ideal is an AVERAGE < 135/85. This AVERAGE should be calculated from @ least 5-7 BP readings taken @ different times of day on different days of week. You should not respond to isolated BP readings , but rather the AVERAGE for that week .Please bring your  blood pressure cuff to office visits to verify that it is reliable.It  can also be checked against the blood pressure device at the pharmacy. Finger or wrist cuffs are not dependable; an arm cuff is.   Your next office appointment will be determined based upon review of your pending labs. Those instructions will be transmitted to you through My Chart .

## 2014-06-26 LAB — HEPATIC FUNCTION PANEL
ALT: 44 U/L (ref 0–53)
AST: 31 U/L (ref 0–37)
Albumin: 3.7 g/dL (ref 3.5–5.2)
Alkaline Phosphatase: 55 U/L (ref 39–117)
BILIRUBIN DIRECT: 0.1 mg/dL (ref 0.0–0.3)
BILIRUBIN TOTAL: 0.5 mg/dL (ref 0.2–1.2)
Total Protein: 7.3 g/dL (ref 6.0–8.3)

## 2014-06-26 LAB — TSH: TSH: 3.99 u[IU]/mL (ref 0.35–4.50)

## 2014-06-26 LAB — BASIC METABOLIC PANEL
BUN: 12 mg/dL (ref 6–23)
CALCIUM: 9.4 mg/dL (ref 8.4–10.5)
CO2: 28 meq/L (ref 19–32)
Chloride: 106 mEq/L (ref 96–112)
Creatinine, Ser: 1 mg/dL (ref 0.4–1.5)
GFR: 86.46 mL/min (ref >60.00)
GLUCOSE: 120 mg/dL — AB (ref 70–99)
Potassium: 4.1 mEq/L (ref 3.5–5.1)
Sodium: 141 mEq/L (ref 135–145)

## 2014-06-30 ENCOUNTER — Other Ambulatory Visit (INDEPENDENT_AMBULATORY_CARE_PROVIDER_SITE_OTHER): Payer: PRIVATE HEALTH INSURANCE

## 2014-06-30 ENCOUNTER — Telehealth: Payer: Self-pay | Admitting: Internal Medicine

## 2014-06-30 ENCOUNTER — Telehealth: Payer: Self-pay

## 2014-06-30 DIAGNOSIS — R7309 Other abnormal glucose: Secondary | ICD-10-CM

## 2014-06-30 LAB — HEMOGLOBIN A1C: Hgb A1c MFr Bld: 5.7 % (ref 4.6–6.5)

## 2014-06-30 NOTE — Telephone Encounter (Signed)
Request for lab add on has been faxed to lab

## 2014-06-30 NOTE — Telephone Encounter (Signed)
Patient states pharmacy has not received script for klonopin

## 2014-06-30 NOTE — Telephone Encounter (Signed)
[facility] Spoke with Alex/pharmacist they did not received script. Gave authorization verbally. Not[facility].../lmb

## 2014-06-30 NOTE — Telephone Encounter (Signed)
Message copied by Noreene LarssonANDREWS, Daleena Rotter R on Mon Jun 30, 2014  8:17 AM ------      Message from: Pecola LawlessHOPPER, WILLIAM F      Created: Sat Jun 28, 2014  8:13 AM       Please add A1c (hyperglycemia)       ------

## 2014-07-08 ENCOUNTER — Telehealth: Payer: Self-pay | Admitting: *Deleted

## 2014-07-08 NOTE — Telephone Encounter (Signed)
Increase roughage (up to 7-9 servings of fruits and vegetables) and your diet as recommended. Drink to thirst up to 40 ounces of water a day. Metamucil or other such agents may help cleansing

## 2014-07-08 NOTE — Telephone Encounter (Signed)
Pt is wanting to know how md feel about otc colon cleanser & can he recommend one...Raechel Chute/lmb

## 2014-07-08 NOTE — Telephone Encounter (Signed)
Called pt no answer LMOM (cell) with md response.../lmb 

## 2014-12-09 ENCOUNTER — Ambulatory Visit (INDEPENDENT_AMBULATORY_CARE_PROVIDER_SITE_OTHER): Payer: BC Managed Care – PPO | Admitting: Vascular Surgery

## 2014-12-10 ENCOUNTER — Ambulatory Visit (INDEPENDENT_AMBULATORY_CARE_PROVIDER_SITE_OTHER): Payer: BC Managed Care – PPO | Admitting: Vascular Surgery

## 2014-12-31 ENCOUNTER — Ambulatory Visit (INDEPENDENT_AMBULATORY_CARE_PROVIDER_SITE_OTHER): Payer: BC Managed Care – PPO | Admitting: Vascular Surgery

## 2015-01-09 ENCOUNTER — Encounter (INDEPENDENT_AMBULATORY_CARE_PROVIDER_SITE_OTHER): Payer: Self-pay

## 2015-03-06 ENCOUNTER — Other Ambulatory Visit: Payer: Self-pay | Admitting: Emergency Medicine

## 2015-03-06 MED ORDER — METOPROLOL TARTRATE 25 MG PO TABS: ORAL_TABLET | ORAL | Status: DC

## 2015-10-12 ENCOUNTER — Ambulatory Visit (INDEPENDENT_AMBULATORY_CARE_PROVIDER_SITE_OTHER): Payer: Managed Care, Other (non HMO) | Admitting: Internal Medicine

## 2015-10-12 ENCOUNTER — Encounter: Payer: Self-pay | Admitting: Internal Medicine

## 2015-10-12 ENCOUNTER — Other Ambulatory Visit (INDEPENDENT_AMBULATORY_CARE_PROVIDER_SITE_OTHER): Payer: Managed Care, Other (non HMO)

## 2015-10-12 VITALS — BP 120/80 | HR 76 | Temp 98.2°F | Resp 14 | Ht 72.0 in | Wt 251.0 lb

## 2015-10-12 DIAGNOSIS — Z8673 Personal history of transient ischemic attack (TIA), and cerebral infarction without residual deficits: Secondary | ICD-10-CM

## 2015-10-12 DIAGNOSIS — R208 Other disturbances of skin sensation: Secondary | ICD-10-CM

## 2015-10-12 DIAGNOSIS — R200 Anesthesia of skin: Secondary | ICD-10-CM

## 2015-10-12 DIAGNOSIS — E669 Obesity, unspecified: Secondary | ICD-10-CM | POA: Diagnosis not present

## 2015-10-12 DIAGNOSIS — I10 Essential (primary) hypertension: Secondary | ICD-10-CM | POA: Diagnosis not present

## 2015-10-12 LAB — COMPREHENSIVE METABOLIC PANEL
ALK PHOS: 54 U/L (ref 39–117)
ALT: 36 U/L (ref 0–53)
AST: 21 U/L (ref 0–37)
Albumin: 4.5 g/dL (ref 3.5–5.2)
BUN: 14 mg/dL (ref 6–23)
CHLORIDE: 105 meq/L (ref 96–112)
CO2: 29 meq/L (ref 19–32)
Calcium: 9.7 mg/dL (ref 8.4–10.5)
Creatinine, Ser: 0.89 mg/dL (ref 0.40–1.50)
GFR: 93.91 mL/min (ref >60.00)
GLUCOSE: 123 mg/dL — AB (ref 70–99)
POTASSIUM: 3.8 meq/L (ref 3.5–5.1)
SODIUM: 141 meq/L (ref 135–145)
TOTAL PROTEIN: 7.4 g/dL (ref 6.0–8.3)
Total Bilirubin: 0.4 mg/dL (ref 0.2–1.2)

## 2015-10-12 LAB — TSH: TSH: 3.1 u[IU]/mL (ref 0.35–4.50)

## 2015-10-12 LAB — LIPID PANEL
CHOL/HDL RATIO: 3
Cholesterol: 112 mg/dL (ref 0–200)
HDL: 38.1 mg/dL — AB (ref >39.00)
LDL CALC: 38 mg/dL (ref 0–99)
NONHDL: 74.39
Triglycerides: 183 mg/dL — ABNORMAL HIGH (ref 0.0–149.0)
VLDL: 36.6 mg/dL (ref 0.0–40.0)

## 2015-10-12 LAB — VITAMIN B12: Vitamin B-12: 692 pg/mL (ref 211–911)

## 2015-10-12 LAB — HEMOGLOBIN A1C: HEMOGLOBIN A1C: 5.8 % (ref 4.6–6.5)

## 2015-10-12 NOTE — Patient Instructions (Signed)
We will check the labs today and call you back about the results.   Keep up the good work with the exercise and working on losing weight.   Health Maintenance, Male A healthy lifestyle and preventative care can promote health and wellness.  Maintain regular health, dental, and eye exams.  Eat a healthy diet. Foods like vegetables, fruits, whole grains, low-fat dairy products, and lean protein foods contain the nutrients you need and are low in calories. Decrease your intake of foods high in solid fats, added sugars, and salt. Get information about a proper diet from your health care provider, if necessary.  Regular physical exercise is one of the most important things you can do for your health. Most adults should get at least 150 minutes of moderate-intensity exercise (any activity that increases your heart rate and causes you to sweat) each week. In addition, most adults need muscle-strengthening exercises on 2 or more days a week.   Maintain a healthy weight. The body mass index (BMI) is a screening tool to identify possible weight problems. It provides an estimate of body fat based on height and weight. Your health care provider can find your BMI and can help you achieve or maintain a healthy weight. For males 20 years and older:  A BMI below 18.5 is considered underweight.  A BMI of 18.5 to 24.9 is normal.  A BMI of 25 to 29.9 is considered overweight.  A BMI of 30 and above is considered obese.  Maintain normal blood lipids and cholesterol by exercising and minimizing your intake of saturated fat. Eat a balanced diet with plenty of fruits and vegetables. Blood tests for lipids and cholesterol should begin at age 62 and be repeated every 5 years. If your lipid or cholesterol levels are high, you are over age 35, or you are at high risk for heart disease, you may need your cholesterol levels checked more frequently.Ongoing high lipid and cholesterol levels should be treated with  medicines if diet and exercise are not working.  If you smoke, find out from your health care provider how to quit. If you do not use tobacco, do not start.  Lung cancer screening is recommended for adults aged 55-80 years who are at high risk for developing lung cancer because of a history of smoking. A yearly low-dose CT scan of the lungs is recommended for people who have at least a 30-pack-year history of smoking and are current smokers or have quit within the past 15 years. A pack year of smoking is smoking an average of 1 pack of cigarettes a day for 1 year (for example, a 30-pack-year history of smoking could mean smoking 1 pack a day for 30 years or 2 packs a day for 15 years). Yearly screening should continue until the smoker has stopped smoking for at least 15 years. Yearly screening should be stopped for people who develop a health problem that would prevent them from having lung cancer treatment.  If you choose to drink alcohol, do not have more than 2 drinks per day. One drink is considered to be 12 oz (360 mL) of beer, 5 oz (150 mL) of wine, or 1.5 oz (45 mL) of liquor.  Avoid the use of street drugs. Do not share needles with anyone. Ask for help if you need support or instructions about stopping the use of drugs.  High blood pressure causes heart disease and increases the risk of stroke. High blood pressure is more likely to develop in:  People who have blood pressure in the end of the normal range (100-139/85-89 mm Hg).  People who are overweight or obese.  People who are African American.  If you are 45-36 years of age, have your blood pressure checked every 3-5 years. If you are 14 years of age or older, have your blood pressure checked every year. You should have your blood pressure measured twice--once when you are at a hospital or clinic, and once when you are not at a hospital or clinic. Record the average of the two measurements. To check your blood pressure when you are not  at a hospital or clinic, you can use:  An automated blood pressure machine at a pharmacy.  A home blood pressure monitor.  If you are 9-28 years old, ask your health care provider if you should take aspirin to prevent heart disease.  Diabetes screening involves taking a blood sample to check your fasting blood sugar level. This should be done once every 3 years after age 9 if you are at a normal weight and without risk factors for diabetes. Testing should be considered at a younger age or be carried out more frequently if you are overweight and have at least 1 risk factor for diabetes.  Colorectal cancer can be detected and often prevented. Most routine colorectal cancer screening begins at the age of 77 and continues through age 34. However, your health care provider may recommend screening at an earlier age if you have risk factors for colon cancer. On a yearly basis, your health care provider may provide home test kits to check for hidden blood in the stool. A small camera at the end of a tube may be used to directly examine the colon (sigmoidoscopy or colonoscopy) to detect the earliest forms of colorectal cancer. Talk to your health care provider about this at age 31 when routine screening begins. A direct exam of the colon should be repeated every 5-10 years through age 78, unless early forms of precancerous polyps or small growths are found.  People who are at an increased risk for hepatitis B should be screened for this virus. You are considered at high risk for hepatitis B if:  You were born in a country where hepatitis B occurs often. Talk with your health care provider about which countries are considered high risk.  Your parents were born in a high-risk country and you have not received a shot to protect against hepatitis B (hepatitis B vaccine).  You have HIV or AIDS.  You use needles to inject street drugs.  You live with, or have sex with, someone who has hepatitis B.  You  are a man who has sex with other men (MSM).  You get hemodialysis treatment.  You take certain medicines for conditions like cancer, organ transplantation, and autoimmune conditions.  Hepatitis C blood testing is recommended for all people born from [birth year] through 1967 and any individual with known risk factors for hepatitis C.  Healthy men should no longer receive prostate-specific antigen (PSA) blood tests as part of routine cancer screening. Talk to your health care provider about prostate cancer screening.  Testicular cancer screening is not recommended for adolescents or adult males who have no symptoms. Screening includes self-exam, a health care provider exam, and other screening tests. Consult with your health care provider about any symptoms you have or any concerns you have about testicular cancer.  Practice safe sex. Use condoms and avoid high-risk sexual practices to reduce the spread of  sexually transmitted infections (STIs).  You should be screened for STIs, including gonorrhea and chlamydia if:  You are sexually active and are younger than 24 years.  You are older than 24 years, and your health care provider tells you that you are at risk for this type of infection.  Your sexual activity has changed since you were last screened, and you are at an increased risk for chlamydia or gonorrhea. Ask your health care provider if you are at risk.  If you are at risk of being infected with HIV, it is recommended that you take a prescription medicine daily to prevent HIV infection. This is called pre-exposure prophylaxis (PrEP). You are considered at risk if:  You are a man who has sex with other men (MSM).  You are a heterosexual man who is sexually active with multiple partners.  You take drugs by injection.  You are sexually active with a partner who has HIV.  Talk with your health care provider about whether you are at high risk of being infected with HIV. If you choose to begin  PrEP, you should first be tested for HIV. You should then be tested every 3 months for as long as you are taking PrEP.  Use sunscreen. Apply sunscreen liberally and repeatedly throughout the day. You should seek shade when your shadow is shorter than you. Protect yourself by wearing long sleeves, pants, a wide-brimmed hat, and sunglasses year round whenever you are outdoors.  Tell your health care provider of new moles or changes in moles, especially if there is a change in shape or color. Also, tell your health care provider if a mole is larger than the size of a pencil eraser.  A one-time screening for abdominal aortic aneurysm (AAA) and surgical repair of large AAAs by ultrasound is recommended for men aged 38-75 years who are current or former smokers.  Stay current with your vaccines (immunizations).   This information is not intended to replace advice given to you by your health care provider. Make sure you discuss any questions you have with your health care provider.   Document Released: 02/18/2008 Document Revised: 09/12/2014 Document Reviewed: 01/17/2011 Elsevier Interactive Patient Education [employer].

## 2015-10-12 NOTE — Progress Notes (Signed)
Pre visit review using our clinic review tool, if applicable. No additional management support is needed unless otherwise documented below in the visit note. 

## 2015-10-13 DIAGNOSIS — E669 Obesity, unspecified: Secondary | ICD-10-CM | POA: Insufficient documentation

## 2015-10-13 DIAGNOSIS — M792 Neuralgia and neuritis, unspecified: Secondary | ICD-10-CM | POA: Insufficient documentation

## 2015-10-13 DIAGNOSIS — R200 Anesthesia of skin: Secondary | ICD-10-CM | POA: Insufficient documentation

## 2015-10-13 NOTE — Assessment & Plan Note (Signed)
>>  ASSESSMENT AND PLAN FOR OBESITY WRITTEN ON 10/13/2015  8:04 AM BY CRAWFORD, ELIZABETH A, MD  Complicated by hypertension, talked to him about the need for weight loss and regular exercise. He will try to incorporate more exercise.

## 2015-10-13 NOTE — Assessment & Plan Note (Signed)
BP initially elevated but came down with recheck. Advised him that he should be taking the metoprolol twice daily as it only lasts 12 hours and clearly he has real hypertension. He does not want to do that. With refill would change to daily formulation to accommodate his preference.

## 2015-10-13 NOTE — Assessment & Plan Note (Signed)
Complicated by hypertension, talked to him about the need for weight loss and regular exercise. He will try to incorporate more exercise.

## 2015-10-13 NOTE — Assessment & Plan Note (Signed)
>>  ASSESSMENT AND PLAN FOR NUMBNESS OF FOOT WRITTEN ON 10/13/2015  8:02 AM BY CRAWFORD, ELIZABETH A, MD  Checking for diabetes, thyroid, B12 levels. Also check CMP. If no cause could get ABI to check circulation. Likely compression from long driving or position.

## 2015-10-13 NOTE — Progress Notes (Signed)
   Subjective:    Patient ID: Nathan Kelley, male    DOB: 29-Oct-1960, 55 y.o.   MRN: [id number]  HPI The patient is a 55 YO man coming in for his blood pressure (not taking his metoprolol BID usually daily, not known to be complicated, he states good control at home), his new problem of numbness in his foot (started several months ago, does a lot of driving, does not hurt during exercise, has not increased), and his weight (he knows that he needs to lose weight but struggles with the traveling to exercise and maintain good food). No other new complaints.   PMH, [facility], social history reviewed and updated.   Review of Systems  Constitutional: Negative for fever, activity change, appetite change, fatigue and unexpected weight change.  HENT: Negative.   Eyes: Negative.   Respiratory: Negative for cough, chest tightness, shortness of breath and wheezing.   Cardiovascular: Negative for chest pain, palpitations and leg swelling.  Gastrointestinal: Negative for nausea, abdominal pain, diarrhea, constipation and abdominal distention.  Musculoskeletal: Positive for arthralgias. Negative for myalgias and back pain.  Skin: Negative.   Neurological: Positive for numbness. Negative for dizziness, weakness, light-headedness and headaches.  Psychiatric/Behavioral: Negative.       Objective:   Physical Exam  Constitutional: He is oriented to person, place, and time. He appears well-developed and well-nourished.  HENT:  Head: Normocephalic and atraumatic.  Eyes: EOM are normal.  Neck: Normal range of motion.  Cardiovascular: Normal rate and regular rhythm.   Pulmonary/Chest: Effort normal and breath sounds normal. No respiratory distress. He has no wheezes. He has no rales.  Abdominal: Soft. Bowel sounds are normal. He exhibits no distension. There is no tenderness. There is no rebound.  Musculoskeletal: He exhibits no edema or tenderness.  Neurological: He is alert and oriented to person, place, and  time. Coordination normal.  Skin: Skin is warm and dry.  Psychiatric: He has a normal mood and affect.   Filed Vitals:   10/12/15 1512 10/12/15 1606  BP: 160/100 120/80  Pulse: 76   Temp: 98.2 F (36.8 C)   TempSrc: Oral   Resp: 14   Height: 6' (1.829 m)   Weight: 251 lb (113.853 kg)   SpO2: 97%       Assessment & Plan:

## 2015-10-13 NOTE — Assessment & Plan Note (Signed)
Checking for diabetes, thyroid, B12 levels. Also check CMP. If no cause could get ABI to check circulation. Likely compression from long driving or position.

## 2015-10-14 ENCOUNTER — Telehealth: Payer: Self-pay | Admitting: Internal Medicine

## 2015-10-14 DIAGNOSIS — R200 Anesthesia of skin: Secondary | ICD-10-CM

## 2015-10-14 NOTE — Telephone Encounter (Signed)
Patient wants to know what to do about his toe pain since labs were normal.

## 2015-10-14 NOTE — Telephone Encounter (Signed)
Pt called back regarding lab work. I read him Dr. Frutoso Chase notes and he's still concerned about the foot pain he is having since his B12 and thyroid are normal. Can you please call him at [phone]

## 2015-10-15 NOTE — Telephone Encounter (Signed)
We can send him to neurology who can check him for neuropathy.

## 2015-10-15 NOTE — Telephone Encounter (Signed)
Patient aware.

## 2015-11-06 ENCOUNTER — Telehealth: Payer: Self-pay | Admitting: Internal Medicine

## 2015-11-06 NOTE — Telephone Encounter (Signed)
Patient states that he was to be referred for someone to look at his foot.  I did not see any referral in system.  Can you please check and follow up with patient?

## 2015-11-09 NOTE — Telephone Encounter (Signed)
This was a referral for neurology and they attempted to contact him several times. Please have him call [facility] to schedule at his convenience.

## 2015-11-09 NOTE — Telephone Encounter (Signed)
Pt called check up on this request. Pt is not too happy of how long he has to wait for this referral. Please check on it and call him back ASAP

## 2015-11-10 NOTE — Telephone Encounter (Signed)
Left message informing patient that the referral has been placed and he needs to call [facility] for an appt.

## 2015-12-01 ENCOUNTER — Encounter: Payer: Self-pay | Admitting: Family Medicine

## 2015-12-01 ENCOUNTER — Ambulatory Visit (INDEPENDENT_AMBULATORY_CARE_PROVIDER_SITE_OTHER): Payer: Managed Care, Other (non HMO) | Admitting: Family Medicine

## 2015-12-01 VITALS — BP 122/80 | HR 75 | Temp 98.6°F | Wt 251.0 lb

## 2015-12-01 DIAGNOSIS — R21 Rash and other nonspecific skin eruption: Secondary | ICD-10-CM

## 2015-12-01 DIAGNOSIS — B029 Zoster without complications: Secondary | ICD-10-CM

## 2015-12-01 MED ORDER — VALACYCLOVIR HCL 1 G PO TABS: 1000.0000 mg | ORAL_TABLET | Freq: Three times a day (TID) | ORAL | Status: DC

## 2015-12-01 MED ORDER — TRIAMCINOLONE ACETONIDE 0.1 % EX CREA: 1.0000 "application " | TOPICAL_CREAM | Freq: Two times a day (BID) | CUTANEOUS | Status: DC

## 2015-12-01 NOTE — Patient Instructions (Signed)
I am concerned about shingles given rash in a consistent dermatome on the right side. Treat with valtrex for 7 days 3x a day.   Can use triamcinolone cream for itch if you want. If rash gets worse- stop cream. If continues to worsen- see Dr. Okey Dupre.   Shingles Shingles, which is also known as herpes zoster, is an infection that causes a painful skin rash and fluid-filled blisters. Shingles is not related to genital herpes, which is a sexually transmitted infection.   Shingles only develops in people who:  Have had chickenpox.  Have received the chickenpox vaccine. (This is rare.) CAUSES Shingles is caused by varicella-zoster virus (VZV). This is the same virus that causes chickenpox. After exposure to VZV, the virus stays in the body in an inactive (dormant) state. Shingles develops if the virus reactivates. This can happen many years after the initial exposure to VZV. It is not known what causes this virus to reactivate. RISK FACTORS People who have had chickenpox or received the chickenpox vaccine are at risk for shingles. Infection is more common in people who:  Are older than age 69.  Have a weakened defense (immune) system, such as those with HIV, AIDS, or cancer.  Are taking medicines that weaken the immune system, such as transplant medicines.  Are under great stress. SYMPTOMS Early symptoms of this condition include itching, tingling, and pain in an area on your skin. Pain may be described as burning, stabbing, or throbbing. A few days or weeks after symptoms start, a painful red rash appears, usually on one side of the body in a bandlike or beltlike pattern. The rash eventually turns into fluid-filled blisters that break open, scab over, and dry up in about 2-3 weeks. At any time during the infection, you may also develop:  A fever.  Chills.  A headache.  An upset stomach. DIAGNOSIS This condition is diagnosed with a skin exam. Sometimes, skin or fluid samples are  taken from the blisters before a diagnosis is made. These samples are examined under a microscope or sent to a lab for testing. TREATMENT There is no specific cure for this condition. Your health care provider will probably prescribe medicines to help you manage pain, recover more quickly, and avoid long-term problems. Medicines may include:  Antiviral drugs.  Anti-inflammatory drugs.  Pain medicines. If the area involved is on your face, you may be referred to a specialist, such as an eye doctor (ophthalmologist) or an ear, nose, and throat (ENT) doctor to help you avoid eye problems, chronic pain, or disability. HOME CARE INSTRUCTIONS Medicines  Take medicines only as directed by your health care provider.  Apply an anti-itch or numbing cream to the affected area as directed by your health care provider. Blister and Rash Care  Take a cool bath or apply cool compresses to the area of the rash or blisters as directed by your health care provider. This may help with pain and itching.  Keep your rash covered with a loose bandage (dressing). Wear loose-fitting clothing to help ease the pain of material rubbing against the rash.  Keep your rash and blisters clean with mild soap and cool water or as directed by your health care provider.  Check your rash every day for signs of infection. These include redness, swelling, and pain that lasts or increases.  Do not pick your blisters.  Do not scratch your rash. General Instructions  Rest as directed by your health care provider.  Keep all follow-up visits as  directed by your health care provider. This is important.  Until your blisters scab over, your infection can cause chickenpox in people who have never had it or been vaccinated against it. To prevent this from happening, avoid contact with other people, especially:  Babies.  Pregnant women.  Children who have eczema.  Elderly people who have transplants.  People who have  chronic illnesses, such as leukemia or AIDS. SEEK MEDICAL CARE IF:  Your pain is not relieved with prescribed medicines.  Your pain does not get better after the rash heals.  Your rash looks infected. Signs of infection include redness, swelling, and pain that lasts or increases. SEEK IMMEDIATE MEDICAL CARE IF:  The rash is on your face or nose.  You have facial pain, pain around your eye area, or loss of feeling on one side of your face.  You have ear pain or you have ringing in your ear.  You have loss of taste.  Your condition gets worse.   This information is not intended to replace advice given to you by your health care provider. Make sure you discuss any questions you have with your health care provider.   Document Released: 08/22/2005 Document Revised: 09/12/2014 Document Reviewed: 07/03/2014 Elsevier Interactive Patient Education [employer] Elsevier Inc.

## 2015-12-01 NOTE — Progress Notes (Addendum)
Tana ConchStephen Hunter, MD  Subjective:  Nathan Kelley is a 55 y.o. year old very pleasant male patient who presents for/with See problem oriented charting ROS- see ros below  Past Medical History-  Patient Active Problem List   Diagnosis Date Noted  . Obesity 10/13/2015  . Numbness of foot 10/13/2015  . Sleep disorder 06/25/2014  . Essential hypertension 11/26/2009  . ANXIETY 07/13/2009  . TRANSIENT ISCHEMIC ATTACK, HX OF 12/23/2008  . GERD 10/10/2007  . Other dysphagia 10/10/2007    Medications- reviewed and updated Current Outpatient Prescriptions  Medication Sig Dispense Refill  . aspirin 325 MG tablet Take 325 mg by mouth daily.      Marland Kitchen. FIBER DIET PO Take 2 tablets by mouth daily.     . metoprolol tartrate (LOPRESSOR) 25 MG tablet TAKE 1 TABLET TWICE A DAY 60 tablet 5   Objective: BP 122/80 mmHg  Pulse 75  Temp(Src) 98.6 F (37 C)  Wt 251 lb (113.853 kg) Gen: NAD, resting comfortably CV: RRR no murmurs rubs or gallops Lungs: CTAB no crackles, wheeze, rhonchi Abdomen: soft/nontender/nondistended/normal bowel sounds. No rebound or guarding.  Ext: no edema Skin: warm, dry  has almost circular patch of vesicles on right low back in T9 distribution On abdomen near umbilicus has another patch of vesicles to right of midline. Some slight erythema just to left of midline (coudl be excoriation)  Neuro: grossly normal, moves all extremities  Assessment/Plan:  Rash S: Broke out with rash on right low back on Sunday. Looked like small bites (red bumps with fluid in them potentially) then later noted it on his stomach. Does not hurt very much but does feel some intermittent burning. Does itch, little red dots. Had chicken pox as child.  ROS-not ill appearing, no fever/chills. No new medications other than OTC fish oil within a week. Not immunocompromised. No mucus membrane involvement.  A/P: Vesicular Rash is isolated in T9 dermatome very suspicious for shingles. There is some light  erythema just to left of midline on stomach which makes me lean away from diagnosis some but wonder if some of this could be excoriation as no true vesicles. Has had history of some fungal infections- no obvious appearance though portion on back was almost a circle of vesicles but not classic ringworm. Will also give some triamcinolone cream for the itch- if worsens will stop this and follow up with Dr. Okey Duprerawford. Return precautions advised.also had started fish oil about a week ago- as new potential exposure asked him to hold this until rash resolves completely.   Meds ordered this encounter  Medications  . valACYclovir (VALTREX) 1000 MG tablet    Sig: Take 1 tablet (1,000 mg total) by mouth 3 (three) times daily.    Dispense:  21 tablet    Refill:  0  . triamcinolone cream (KENALOG) 0.1 %    Sig: Apply 1 application topically 2 (two) times daily.    Dispense:  30 g    Refill:  0

## 2015-12-02 ENCOUNTER — Other Ambulatory Visit: Payer: Self-pay

## 2015-12-02 MED ORDER — METOPROLOL TARTRATE 25 MG PO TABS: ORAL_TABLET | ORAL | Status: DC

## 2015-12-03 ENCOUNTER — Telehealth: Payer: Self-pay | Admitting: Family Medicine

## 2015-12-03 NOTE — Telephone Encounter (Signed)
FYI

## 2015-12-03 NOTE — Telephone Encounter (Signed)
Patient Name: Nathan RaisinKENNETH Risby  DOB: [birth year]    Initial Comment Caller states saw Dr hunter Tuesday was given RX for shingles. it is making him sleepy, dizzy and frequent urination    Nurse Assessment  Nurse: Sherilyn CooterHenry, RN, Thurmond ButtsWade Date/Time Lamount Cohen(Eastern Time): 12/03/2015 9:29:57 AM  Confirm and document reason for call. If symptomatic, describe symptoms. You must click the next button to save text entered. ---Caller states that he was prescribed Valacyclovir for shingles. Since he began taking it, he is sleepy, dizzy and having more BM's than usual. The dizziness is only at times. He is "slightly dizzy" at present. Per Drugs.com, these can be side effects but its incidence rate is unknown. https://www.drugs.com/sfx/valacyclovir-side-effects.html  Has the patient traveled out of the country within the last 30 days? ---No  Does the patient have any new or worsening symptoms? ---Yes  Will a triage be completed? ---No  Select reason for no triage. ---Other     Guidelines    Guideline Title Affirmed Question Affirmed Notes       Final Disposition User   Clinical Call Sherilyn CooterHenry, RN, Thurmond ButtsWade    Comments  I had began a triage and he states "all I want to tell you is that I have some nausea and dizziness and i wanted to know if these were side effects or not." He states that if it is a side effect, he can live with it. Advised him that they are listed as side effect with an unknown incidence rate. Unless it bothers him he should continue to take the medication.

## 2015-12-04 ENCOUNTER — Telehealth: Payer: Self-pay | Admitting: Internal Medicine

## 2015-12-04 ENCOUNTER — Telehealth: Payer: Self-pay

## 2015-12-04 NOTE — Telephone Encounter (Signed)
Patient was seen this past Saturday at our [facility] by dr hunter---at that time, patient had itching/rash that could possibly be shingles, so dr hunter sent valtrex in to pharm---since that time, patient has carried his dog to the vet and dog was diagnosed with fleas----so patient feels this is really flea bites he has----this is dr Okey Duprecrawford patient----are you ok with stopping valtrex? And would there be another med that would be better for flea bites----can you please advise in dr crawford's absence?  thanks

## 2015-12-04 NOTE — Telephone Encounter (Signed)
Patient Name: Bernita RaisinKENNETH Breeden DOB: [birth year] Initial Comment caller states he has rash on his body, possible flea bites Nurse Assessment Nurse: Charna Elizabethrumbull, RN, Cathy Date/Time (Eastern Time): 12/04/2015 1:57:30 PM Confirm and document reason for call. If symptomatic, describe symptoms. You must click the next button to save text entered. ---Caller states he developed a rash on his stomach and back about 6 days ago. No fever. He hasn't seen fleas, but he has a dog. Alert and responsive. No wheezing. No breathing or swallowing difficulty. Has the patient traveled out of the country within the last 30 days? ---No Does the patient have any new or worsening symptoms? ---Yes Will a triage be completed? ---Yes Related visit to physician within the last 2 weeks? ---No Does the PT have any chronic conditions? (i.e. diabetes, asthma, etc.) ---Yes List chronic conditions. ---High Blood Pressure Is this a behavioral health or substance abuse call? ---No Guidelines Guideline Title Affirmed Question Affirmed Notes Rash or Redness - Widespread Mild widespread rash Final Disposition User See PCP When Office is Open (within 3 days) Charna Elizabethrumbull, RN, Cathy [facility] Disagree/Comply: Comply Scheduled appointment 12/07/15 11am with Dr. Carver Filaalone.

## 2015-12-06 NOTE — Telephone Encounter (Signed)
If they are flea bites they should be generally managed with soap and water and OTC medications like Zyrtec or Calamine should help with the symptoms. If he is convinced it is not shingles without any burning pains than ok to stop Valacyclovir.

## 2015-12-07 ENCOUNTER — Ambulatory Visit: Payer: Self-pay | Admitting: Family

## 2015-12-07 DIAGNOSIS — Z0289 Encounter for other administrative examinations: Secondary | ICD-10-CM

## 2015-12-07 NOTE — Telephone Encounter (Signed)
Left message on patient cell phone advising of note from greg calone

## 2015-12-09 ENCOUNTER — Telehealth: Payer: Self-pay | Admitting: Internal Medicine

## 2015-12-09 NOTE — Telephone Encounter (Signed)
Patient no showed for acute visit for rash with Marcos EkeGreg Calone on 4/3.  Please advise.

## 2015-12-11 ENCOUNTER — Ambulatory Visit: Payer: Managed Care, Other (non HMO) | Admitting: Neurology

## 2016-03-07 ENCOUNTER — Telehealth: Payer: Self-pay

## 2016-03-07 NOTE — Telephone Encounter (Signed)
Patient called and said he got a bill for his cpe that was done on 10/12/15. They informed him that his cpe was an office visit. While speaking with him he did say that he had asked her about his toe during this visit. He clams that this is unfair and he wants something done about it. Thank you.

## 2016-03-10 NOTE — Telephone Encounter (Signed)
Emailed coding to review documentation/appropriate coding of this visit. Will contact patient after review.

## 2016-03-22 NOTE — Telephone Encounter (Signed)
See Account Note in Transaction Inquiry.

## 2016-03-30 NOTE — Telephone Encounter (Signed)
Received team health note this morning that the patient is requestign a call. Called the patient. He states that he got a call from a collections agency last night collecting on 10/12/2015- advised that julie is having this adjusted and that we will follow up  He also disputes coding and states that dx are being added incorrectly. He states this is a cpe. I am mailing a copy of the visit notes from 10/12/15 to the patient

## 2016-09-26 ENCOUNTER — Ambulatory Visit: Payer: Managed Care, Other (non HMO) | Admitting: Adult Health

## 2016-11-05 ENCOUNTER — Other Ambulatory Visit: Payer: Self-pay | Admitting: Internal Medicine

## 2016-12-09 ENCOUNTER — Telehealth: Payer: Self-pay | Admitting: Internal Medicine

## 2016-12-09 NOTE — Telephone Encounter (Signed)
Med refused because patient hasn't been seen in over a year, he needs a 30 minute visit scheduled and then ill fill it for enough to get him to the apt date, will be filled with refills after visit

## 2016-12-09 NOTE — Telephone Encounter (Signed)
Patient called office back. He for some reason was very upset he needed to make an appointment to be able to get refills. I informed him we would send in enough until he was able to come in. He had not been here in over a year that is why he would have to be seen. I asked would he like to make this as a cpe since he was due. He stated NO because last time Dr. Okey Dupre asked him how he was and he was charged for it and got a big bill. He then said this is crazy he needs an appointment to get bp medication and hung up.

## 2016-12-09 NOTE — Telephone Encounter (Signed)
Patient is requesting a 30 day supply in order to allow him time to find another provider.  Please follow up with patient in regard.  Did notify patient that script may not be sent in until Monday.

## 2016-12-09 NOTE — Telephone Encounter (Signed)
Needs refill of metoprolol tartrate (LOPRESSOR) 25 MG

## 2016-12-12 ENCOUNTER — Telehealth: Payer: Self-pay | Admitting: Internal Medicine

## 2016-12-12 MED ORDER — METOPROLOL TARTRATE 25 MG PO TABS: 25.0000 mg | ORAL_TABLET | Freq: Two times a day (BID) | ORAL | 0 refills | Status: DC

## 2016-12-12 NOTE — Telephone Encounter (Signed)
OK with me.

## 2016-12-12 NOTE — Telephone Encounter (Signed)
Sent in 30 day supply but no more refills can be done.

## 2016-12-12 NOTE — Telephone Encounter (Signed)
Patient request to transfer care from Dr. Hillard Danker to Dr. Carmelia Roller

## 2016-12-12 NOTE — Telephone Encounter (Signed)
Fine with me, fyi this patient was only seen once as transfer visit and this was charged as office visit and he was upset at having to pay and this is the reason for the switch.

## 2016-12-12 NOTE — Telephone Encounter (Signed)
Stated to patient the other day that med would be filled once he made an apt with enough medication to get him to his apt, patient isnt wanting to make visit.Is this ok to send in for 30 days till patient get a new provider, please advise

## 2016-12-12 NOTE — Telephone Encounter (Signed)
LVM with patient stating we sent in 30 Day supply and will no longer be able to send in anymore

## 2016-12-30 NOTE — Telephone Encounter (Signed)
Patient would like a physical appointment at the time of he's new patient appointment, please advise

## 2016-12-30 NOTE — Telephone Encounter (Signed)
A physical consists of managing preventative health measure, generally including labs, and does not address acute concerns. If people have no other concerns other than having a physical done, I will do it on the first visit. TY.

## 2016-12-30 NOTE — Telephone Encounter (Signed)
Pt notified and made aware.  He is in agreement with plan.  Appt scheduled.

## 2017-01-12 ENCOUNTER — Ambulatory Visit: Payer: Managed Care, Other (non HMO) | Admitting: Family Medicine

## 2017-01-31 ENCOUNTER — Ambulatory Visit: Payer: Managed Care, Other (non HMO) | Admitting: Family Medicine

## 2017-02-16 ENCOUNTER — Telehealth: Payer: Self-pay | Admitting: Family Medicine

## 2017-02-16 ENCOUNTER — Encounter: Payer: Self-pay | Admitting: Family Medicine

## 2017-02-16 ENCOUNTER — Ambulatory Visit: Payer: 59 | Admitting: Family Medicine

## 2017-02-16 VITALS — BP 138/75 | HR 75 | Ht 72.0 in | Wt 245.4 lb

## 2017-02-16 DIAGNOSIS — R7302 Impaired glucose tolerance (oral): Secondary | ICD-10-CM

## 2017-02-16 DIAGNOSIS — F430 Acute stress reaction: Secondary | ICD-10-CM

## 2017-02-16 DIAGNOSIS — M792 Neuralgia and neuritis, unspecified: Secondary | ICD-10-CM

## 2017-02-16 DIAGNOSIS — G5791 Unspecified mononeuropathy of right lower limb: Secondary | ICD-10-CM

## 2017-02-16 DIAGNOSIS — Z833 Family history of diabetes mellitus: Secondary | ICD-10-CM | POA: Insufficient documentation

## 2017-02-16 DIAGNOSIS — R200 Anesthesia of skin: Secondary | ICD-10-CM

## 2017-02-16 DIAGNOSIS — I10 Essential (primary) hypertension: Secondary | ICD-10-CM

## 2017-02-16 DIAGNOSIS — M722 Plantar fascial fibromatosis: Secondary | ICD-10-CM | POA: Insufficient documentation

## 2017-02-16 DIAGNOSIS — E669 Obesity, unspecified: Secondary | ICD-10-CM

## 2017-02-16 MED ORDER — FLUOXETINE HCL 20 MG PO TABS: 20.0000 mg | ORAL_TABLET | Freq: Every day | ORAL | 1 refills | Status: DC

## 2017-02-16 MED ORDER — METOPROLOL TARTRATE 25 MG PO TABS: 25.0000 mg | ORAL_TABLET | Freq: Two times a day (BID) | ORAL | 0 refills | Status: DC
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
## 2017-02-16 NOTE — Telephone Encounter (Signed)
Pt called states price of FLUoxetine 20 MG tablets too high-- Please advise is there is a less expensive  Medicine of equal strenght-- --glh

## 2017-02-16 NOTE — Assessment & Plan Note (Addendum)
Been many years since patient has been on any SSRI in 2013 per our records he was on Prozac- since tolerated well, we'll start at 20 mg..   Start fluoxetine 20 mg daily.  Recommend counseling  Diet and exercise to improve mood discussed with patient.  Handouts provided.  F/up 4-6 wks, sooner if prob

## 2017-02-16 NOTE — Patient Instructions (Addendum)
Unless your lab work that you're going to come in for in the near future for lab only visit, unless those results are really bad, you will not hear from us and we will discuss after next office visit.   Please realize, EXERCISE IS MEDICINE!  -  American Heart Association [facility]( AHA) guidelines for exercise : If you are in good health, without any medical conditions, you should engage in 150 minutes of moderate intensity aerobic activity per week.  This means you should be huffing and puffing throughout your workout.   Engaging in regular exercise will improve brain function and memory, as well as improve mood, boost immune system and help with weight management.  As well as the other, more well-known effects of exercise such as decreasing blood sugar levels, decreasing blood pressure,  and decreasing bad cholesterol levels/ increasing good cholesterol levels.     -  The AHA strongly endorses consumption of a diet that contains a variety of foods from all the food categories with an emphasis on fruits and vegetables; fat-free and low-fat dairy products; cereal and grain products; legumes and nuts; and fish, poultry, and/or extra lean meats.    Excessive food intake, especially of foods high in saturated and trans fats, sugar, and salt, should be avoided.    Adequate water intake of roughly 1/2 of your weight in pounds, should equal the ounces of water per day you should drink.  So for instance, if you're 200 pounds, that would be 100 ounces of water per day.         Mediterranean Diet  Why follow it? Research shows. . Those who follow the Mediterranean diet have a reduced risk of heart disease  . The diet is associated with a reduced incidence of Parkinson's and Alzheimer's diseases . People following the diet may have longer life expectancies and lower rates of chronic diseases  . The Dietary Guidelines for Americans recommends the Mediterranean diet as an eating plan to promote health and prevent  disease  What Is the Mediterranean Diet?  . Healthy eating plan based on typical foods and recipes of Mediterranean-style cooking . The diet is primarily a plant based diet; these foods should make up a majority of meals   Starches - Plant based foods should make up a majority of meals - They are an important sources of vitamins, minerals, energy, antioxidants, and fiber - Choose whole grains, foods high in fiber and minimally processed items  - Typical grain sources include wheat, oats, barley, corn, brown rice, bulgar, farro, millet, polenta, couscous  - Various types of beans include chickpeas, lentils, fava beans, black beans, white beans   Fruits  Veggies - Large quantities of antioxidant rich fruits & veggies; 6 or more servings  - Vegetables can be eaten raw or lightly drizzled with oil and cooked  - Vegetables common to the traditional Mediterranean Diet include: artichokes, arugula, beets, broccoli, brussel sprouts, cabbage, carrots, celery, collard greens, cucumbers, eggplant, kale, leeks, lemons, lettuce, mushrooms, okra, onions, peas, peppers, potatoes, pumpkin, radishes, rutabaga, shallots, spinach, sweet potatoes, turnips, zucchini - Fruits common to the Mediterranean Diet include: apples, apricots, avocados, cherries, clementines, dates, figs, grapefruits, grapes, melons, nectarines, oranges, peaches, pears, pomegranates, strawberries, tangerines  Fats - Replace butter and margarine with healthy oils, such as olive oil, canola oil, and tahini  - Limit nuts to no more than a handful a day  - Nuts include walnuts, almonds, pecans, pistachios, pine nuts  - Limit or avoid candied, honey  roasted or heavily salted nuts - Olives are central to the Mediterranean diet - can be eaten whole or used in a variety of dishes   Meats Protein - Limiting red meat: no more than a few times a month - When eating red meat: choose lean cuts and keep the portion to the size of deck of cards - Eggs:  approx. 0 to 4 times a week  - Fish and lean poultry: at least 2 a week  - Healthy protein sources include, chicken, [place], lean beef, lamb - Increase intake of seafood such as tuna, salmon, trout, mackerel, shrimp, scallops - Avoid or limit high fat processed meats such as sausage and bacon  Dairy - Include moderate amounts of low fat dairy products  - Focus on healthy dairy such as fat free yogurt, skim milk, low or reduced fat cheese - Limit dairy products higher in fat such as whole or 2% milk, cheese, ice cream  Alcohol - Moderate amounts of red wine is ok  - No more than 5 oz daily for women (all ages) and men older than age 102  - No more than 10 oz of wine daily for men younger than 32  Other - Limit sweets and other desserts  - Use herbs and spices instead of salt to flavor foods  - Herbs and spices common to the traditional Mediterranean Diet include: basil, bay leaves, chives, cloves, cumin, fennel, garlic, lavender, marjoram, mint, oregano, parsley, pepper, rosemary, sage, savory, sumac, tarragon, thyme   It's not just a diet, it's a lifestyle:  . The Mediterranean diet includes lifestyle factors typical of those in the region  . Foods, drinks and meals are best eaten with others and savored . Daily physical activity is important for overall good health . This could be strenuous exercise like running and aerobics . This could also be more leisurely activities such as walking, housework, yard-work, or taking the stairs . Moderation is the key; a balanced and healthy diet accommodates most foods and drinks . Consider portion sizes and frequency of consumption of certain foods   Meal Ideas & Options:  . Breakfast:  o Whole wheat toast or whole wheat English muffins with peanut butter & hard boiled egg o Steel cut oats topped with apples & cinnamon and skim milk  o Fresh fruit: banana, strawberries, melon, berries, peaches  o Smoothies: strawberries, bananas, greek yogurt, peanut  butter o Low fat greek yogurt with blueberries and granola  o Egg white omelet with spinach and mushrooms o Breakfast couscous: whole wheat couscous, apricots, skim milk, cranberries  . Sandwiches:  o Hummus and grilled vegetables (peppers, zucchini, squash) on whole wheat bread   o Grilled chicken on whole wheat pita with lettuce, tomatoes, cucumbers or tzatziki  o Tuna salad on whole wheat bread: tuna salad made with greek yogurt, olives, red peppers, capers, green onions o Garlic rosemary lamb pita: lamb sauted with garlic, rosemary, salt & pepper; add lettuce, cucumber, greek yogurt to pita - flavor with lemon juice and black pepper  . Seafood:  o Mediterranean grilled salmon, seasoned with garlic, basil, parsley, lemon juice and black pepper o Shrimp, lemon, and spinach whole-grain pasta salad made with low fat greek yogurt  o Seared scallops with lemon orzo  o Seared tuna steaks seasoned salt, pepper, coriander topped with tomato mixture of olives, tomatoes, olive oil, minced garlic, parsley, green onions and cappers  . Meats:  o Herbed greek chicken salad with kalamata olives, cucumber, feta  o Red bell peppers stuffed with spinach, bulgur, lean ground beef (or lentils) & topped with feta   o Kebabs: skewers of chicken, tomatoes, onions, zucchini, squash  o [place] burgers: made with red onions, mint, dill, lemon juice, feta cheese topped with roasted red peppers . Vegetarian o Cucumber salad: cucumbers, artichoke hearts, celery, red onion, feta cheese, tossed in olive oil & lemon juice  o Hummus and whole grain pita points with a greek salad (lettuce, tomato, feta, olives, cucumbers, red onion) o Lentil soup with celery, carrots made with vegetable broth, garlic, salt and pepper  o Tabouli salad: parsley, bulgur, mint, scallions, cucumbers, tomato, radishes, lemon juice, olive oil, salt and pepper.    What is Chronic Stress Syndrome, Symptoms & Ways to Deal With it   What is  Chronic Stress Syndrome?  Chronic Stress Syndrome is something which can now be called as a medical condition due to the amount of stress an individual is going through these days. Chronic Stress Syndrome causes the body and mind to shutdown and the person has no control over himself or herself. Due to the demands of modern day life and the hardship throughout day and night takes its toll over a period of time and the body and brain starts demanding rest and a break. This leads to certain symptoms where your performance level starts to dip at work, you become irritable both at work and at home, you may stop enjoying activities you previously liked, you may become depressed, you may get angry for even small things. Chronic Stress Syndrome can significantly impact your quality life. Thus it is important understand the symptoms of Chronic Stress Syndrome and react accordingly in order to cope up with it.  It is important to note here that a balanced work-home equation should be drawn to cut down symptoms of Chronic Stress Syndrome. Minor stressors can be overcome by the body's inbuilt stress response but when there is unending stress for a long period of time then an external help is required to ease the stress.  Chronic Stress Syndrome can physically and psychologically drain you over a period of time. For such cases stress management is the best way to cope up with Chronic Stress Syndrome. If Chronic Stress Syndrome is not treated then it may result in many health hazards like anxiety, muscle pain, insomnia, and high blood pressure along with a compromised immune system leading to frequent infections and missed days from work.    What are the Symptoms of Chronic Stress Syndrome?   The symptoms of Chronic Stress Syndrome are variable and range from generalized symptoms to emotional symptoms along with behavioral and cognitive symptoms. Some of these symptoms have been delineated below:  Generalized  Symptoms of Chronic Stress Syndrome are: Anxiety Depression Social isolation Headache Abdominal pain Lack of sleep Back pain Difficulty in concentrating Hypertension Hemorrhoids Varicose veins Panic attacks/ Panic disorder Cardiovascular diseases.   Some of the Emotional Symptoms of Chronic Stress Syndrome are: To become easily agitated, moody and frustrated Feeling overwhelmed which makes you feel like you are losing control. Having difficulty relaxing and have a peaceful mind Having low self esteem Feeling lonely Feeling worthless Feeling depressed Avoiding social environment.   Some of the Physical Symptoms of Chronic Stress Syndrome are: Headaches Lethargy Alternating diarrhea and constipation Nausea Muscles aches and pains Insomnia Rapid heartbeat and chest pain Infections and frequent colds Decreased libido Nervousness and shaking Tinnitus Sweaty palms Dry mouth Clenched jaw.  Some of the Cognitive  Symptoms of Chronic Stress Syndrome are: Constant worrying Racing thoughts Disorganization and forgetfulness Inability to focus Poor judgment Abundance of negativity.  Some of the Behavioral Symptoms of Chronic Stress Syndrome are: Changes in appetite with less desire to eat Avoiding responsibilities Indulgence in alcohol or recreational drug use Increased nail biting and being fidgety Ways to Deal With Chronic Stress Syndrome    Chronic Stress Syndrome is not something which cannot be addressed. A bit of effort from your side in the form of lifestyle modifications, a little bit of exercise, a balanced work life equation can do wonders and help you get rid of Chronic Stress Syndrome.  Get Proper Sleep: It has been proved that Chronic Stress Syndrome causes loss of sleep where an individual may not even be able to sleep for days unending. This may result in the individual feeling lethargic and unable to focus at work the following morning. This may lead to  decreased performance at work. Thus, it is important to have a good sleep-wake cycle. For this, try and not drink any caffeinated beverage about four hours prior to going to sleep, as caffeine pumps up the adrenaline and causes you to stay awake resulting ultimately in Chronic Stress Syndrome.  Avoid Alcohol and Drugs: Another way to get rid of Chronic Stress Syndrome is lifestyle modifications. Stay away from alcohol and other recreational drugs. Take Short Frequent Breaks at Work: Try to take frequent breaks from work and do not work continuously. Try and manage your work in such a way that you even meet your deadline and come home on time for a happy dinner with family. A good time spent with family and kids does wonders in not only dealing with Chronic Stress Syndrome but also preventing it.  Become Physically Active: Another step towards getting rid of Chronic Stress Syndrome is physical activity. If you do not have time to spend at the gym then at least try and go for daily walks for about half an hour a day which not only keeps the stress away but also is good for your overall health. Physical activity leads to production of endorphins which will make you feel relaxed and feel good.  Healthy Diet Can Help You Deal With Chronic Stress Syndrome: Have a balanced and healthy diet is another step towards a stress free life and keeping Chronic Stress Syndrome at bay. If time is a constraint then you can try eating three small meals a day. Try and avoid fast foods and take foods which are healthy and rich in proteins, fiber, and carbohydrates to boost your energy system.  Music Can Soothe Your Mind: Light music is one of the best and most effective relaxation techniques that one can try to overcome stress. It has shown to calm down the mind and take you away from all the stressors that you may be having. These days it is also being used as a therapy in some institutes for overcoming stress. It is important  here to discuss the importance of a good social support system for patients with Chronic Stress Syndrome, as a good social support framework can do wonders in taking the stress away from the patient and overcoming Chronic Stress Syndrome.  Meditation Can Help You Deal With Chronic Stress Syndrome Effectively: Meditation and yoga has also shown to be quite effective in relaxing the mind and coping up with Chronic Stress Syndrome   In cases where these measures are not helpful, then it is time for you to consult  with a skilled psychologist or a psychiatrist for potential therapies or medications to control the stress response.   The psychologist can help you with a variety of steps for coping up with Chronic Stress Syndrome. Relaxation techniques and behavioral therapy are some of the methods employed by psychologists. In some cases, medications can also be given to help relax the patient.  Since Chronic Stress Syndrome is both emotionally and physically draining for the patient and it also adversely affects the family life of the patient hence it is important for the patient to recognize the condition and taking steps to cope up with it. Escaping measures like alcohol and drug use are of no help as they only aggravate the condition apart from their other health hazards. If this condition is ignored or left untreated it can lead to various medical conditions like anxiety and depression and various other medical conditions.  Last but not least, smile as often as you can as it is the best gift that you can give to someone. The best way to stay relaxed is to have a good smile, exercise daily, spend time with your family, meditation and if required consultation with a good psychologist so that you can live a stress free life and overcome the symptoms of Chronic Stress Syndrome.

## 2017-02-16 NOTE — Assessment & Plan Note (Signed)
Blood pressure essentially controlled today.    Refill of metoprolol given  Diet lifestyle counseling.

## 2017-02-17 NOTE — Telephone Encounter (Signed)
Left message for patient to contact his insurance to see what SSRI they cover before we just switch.  Also reminded patient he may have a high deductible or copay no matter what medication we give.  Advised patient to call back with response.

## 2017-02-20 ENCOUNTER — Other Ambulatory Visit: Payer: Self-pay | Admitting: Family Medicine

## 2017-02-20 MED ORDER — FLUOXETINE HCL 20 MG PO CAPS: 20.0000 mg | ORAL_CAPSULE | Freq: Every day | ORAL | 1 refills | Status: DC

## 2017-02-20 NOTE — Telephone Encounter (Signed)
Pt called states he spoke w/ Ins Co and they said High pricing connected to (form of medication) change from Tablets to Capsules and pricing would greatly reduce.--Pt called to ask Dr. Sharee Holsterpalski to switch [facility] at [facility] [address] & call him when Rx  has been confirmed at pharmacy-- ph# [phone]. --glh

## 2017-02-20 NOTE — Telephone Encounter (Signed)
Prozac rx changed from tablets to capsule per insurance request.  Patient aware.

## 2017-02-27 ENCOUNTER — Other Ambulatory Visit (INDEPENDENT_AMBULATORY_CARE_PROVIDER_SITE_OTHER): Payer: 59

## 2017-02-27 DIAGNOSIS — Z833 Family history of diabetes mellitus: Secondary | ICD-10-CM

## 2017-02-27 DIAGNOSIS — F430 Acute stress reaction: Secondary | ICD-10-CM

## 2017-02-27 DIAGNOSIS — E669 Obesity, unspecified: Secondary | ICD-10-CM

## 2017-02-27 DIAGNOSIS — M792 Neuralgia and neuritis, unspecified: Secondary | ICD-10-CM

## 2017-02-27 DIAGNOSIS — R200 Anesthesia of skin: Secondary | ICD-10-CM

## 2017-02-28 LAB — COMPREHENSIVE METABOLIC PANEL
ALK PHOS: 59 IU/L (ref 39–117)
ALT: 44 IU/L (ref 0–44)
AST: 24 IU/L (ref 0–40)
Albumin/Globulin Ratio: 1.7 (ref 1.2–2.2)
Albumin: 4.5 g/dL (ref 3.5–5.5)
BUN/Creatinine Ratio: 11 (ref 9–20)
BUN: 10 mg/dL (ref 6–24)
Bilirubin Total: 0.4 mg/dL (ref 0.0–1.2)
CALCIUM: 9.3 mg/dL (ref 8.7–10.2)
CO2: 22 mmol/L (ref 20–29)
CREATININE: 0.91 mg/dL (ref 0.76–1.27)
Chloride: 105 mmol/L (ref 96–106)
GFR calc Af Amer: 108 mL/min/{1.73_m2} (ref >59)
GFR, EST NON AFRICAN AMERICAN: 93 mL/min/{1.73_m2} (ref >59)
GLOBULIN, TOTAL: 2.7 g/dL (ref 1.5–4.5)
GLUCOSE: 115 mg/dL — AB (ref 65–99)
Potassium: 4.1 mmol/L (ref 3.5–5.2)
Sodium: 143 mmol/L (ref 134–144)
Total Protein: 7.2 g/dL (ref 6.0–8.5)

## 2017-02-28 LAB — CBC WITH DIFFERENTIAL/PLATELET
BASOS ABS: 0 10*3/uL (ref 0.0–0.2)
Basos: 0 %
EOS (ABSOLUTE): 0.1 10*3/uL (ref 0.0–0.4)
EOS: 1 %
HEMATOCRIT: 46.6 % (ref 37.5–51.0)
HEMOGLOBIN: 16.4 g/dL (ref 13.0–17.7)
IMMATURE GRANULOCYTES: 0 %
Immature Grans (Abs): 0 10*3/uL (ref 0.0–0.1)
LYMPHS ABS: 2.6 10*3/uL (ref 0.7–3.1)
Lymphs: 33 %
MCH: 30.9 pg (ref 26.6–33.0)
MCHC: 35.2 g/dL (ref 31.5–35.7)
MCV: 88 fL (ref 79–97)
MONOCYTES: 7 %
MONOS ABS: 0.5 10*3/uL (ref 0.1–0.9)
NEUTROS PCT: 59 %
Neutrophils Absolute: 4.6 10*3/uL (ref 1.4–7.0)
Platelets: 239 10*3/uL (ref 150–379)
RBC: 5.3 x10E6/uL (ref 4.14–5.80)
RDW: 13.6 % (ref 12.3–15.4)
WBC: 7.9 10*3/uL (ref 3.4–10.8)

## 2017-02-28 LAB — HIV ANTIBODY (ROUTINE TESTING W REFLEX): HIV SCREEN 4TH GENERATION: NONREACTIVE

## 2017-02-28 LAB — VITAMIN D 25 HYDROXY (VIT D DEFICIENCY, FRACTURES): Vit D, 25-Hydroxy: 40.8 ng/mL (ref 30.0–100.0)

## 2017-02-28 LAB — T4, FREE: Free T4: 0.95 ng/dL (ref 0.82–1.77)

## 2017-02-28 LAB — LIPID PANEL
CHOLESTEROL TOTAL: 104 mg/dL (ref 100–199)
Chol/HDL Ratio: 2.5 ratio (ref 0.0–5.0)
HDL: 41 mg/dL (ref >39)
LDL Calculated: 51 mg/dL (ref 0–99)
Triglycerides: 59 mg/dL (ref 0–149)
VLDL CHOLESTEROL CAL: 12 mg/dL (ref 5–40)

## 2017-02-28 LAB — HEPATITIS C ANTIBODY

## 2017-02-28 LAB — TSH: TSH: 4.22 u[IU]/mL (ref 0.450–4.500)

## 2017-02-28 LAB — VITAMIN B12: Vitamin B-12: 1079 pg/mL (ref 232–1245)

## 2017-02-28 LAB — HEMOGLOBIN A1C
ESTIMATED AVERAGE GLUCOSE: 131 mg/dL
HEMOGLOBIN A1C: 6.2 % — AB (ref 4.8–5.6)

## 2017-04-06 ENCOUNTER — Other Ambulatory Visit: Payer: Self-pay | Admitting: Family Medicine

## 2017-04-06 DIAGNOSIS — I10 Essential (primary) hypertension: Secondary | ICD-10-CM

## 2017-05-15 ENCOUNTER — Other Ambulatory Visit: Payer: Self-pay

## 2017-05-15 DIAGNOSIS — I10 Essential (primary) hypertension: Secondary | ICD-10-CM

## 2017-05-15 MED ORDER — METOPROLOL TARTRATE 25 MG PO TABS: 25.0000 mg | ORAL_TABLET | Freq: Two times a day (BID) | ORAL | 0 refills | Status: DC

## 2017-05-15 MED ORDER — FLUOXETINE HCL 20 MG PO CAPS: 20.0000 mg | ORAL_CAPSULE | Freq: Every day | ORAL | 1 refills | Status: DC

## 2017-05-15 NOTE — Telephone Encounter (Signed)
Pharmacy sent request for change to 90 day supply on fluoxentine and metoprolol.  Resent to pharmacy. MPulliam, CMA/RT(R)

## 2017-06-28 ENCOUNTER — Ambulatory Visit (INDEPENDENT_AMBULATORY_CARE_PROVIDER_SITE_OTHER): Payer: 59 | Admitting: Family Medicine

## 2017-06-28 ENCOUNTER — Ambulatory Visit: Payer: 59

## 2017-06-28 VITALS — BP 125/78 | HR 64 | Ht 72.0 in | Wt 247.9 lb

## 2017-06-28 DIAGNOSIS — M25422 Effusion, left elbow: Secondary | ICD-10-CM

## 2017-06-28 DIAGNOSIS — Z23 Encounter for immunization: Secondary | ICD-10-CM | POA: Diagnosis not present

## 2017-06-28 DIAGNOSIS — M25522 Pain in left elbow: Secondary | ICD-10-CM

## 2017-06-28 NOTE — Progress Notes (Signed)
Pt here for an acute care OV today   Impression and Recommendations:    1. Pain and swelling of left elbow   2. Left elbow pain   3. Flu vaccine need     Orders Placed This Encounter  Procedures  . DG Elbow Complete Left  . MR ELBOW LEFT WO CONTRAST  . Flu Vaccine QUAD 6+ mos PF IM (Fluarix Quad PF)    The patient was counseled, risk factors were discussed, anticipatory guidance given.  New Prescriptions   No medications on file    Discontinued Medications   VALACYCLOVIR (VALTREX) 1000 MG TABLET    Take 1 tablet (1,000 mg total) by mouth 3 (three) times daily.    Modified Medications   No medications on file    Gross side effects, risk and benefits, and alternatives of medications and treatment plan in general discussed with patient.  Patient is aware that all medications have potential side effects and we are unable to predict every side effect or drug-drug interaction that may occur.   Patient will call with any questions prior to using medication if they have concerns.  Expresses verbal understanding and consents to current therapy and treatment regimen.  No barriers to understanding were identified.  Red flag symptoms and signs discussed in detail.  Patient expressed understanding regarding what to do in case of emergency\urgent symptoms  Please see AVS handed out to patient at the end of our visit for further patient instructions/ counseling done pertaining to today's office visit.   Return if symptoms worsen or fail to improve.     Note: This document was prepared using Dragon voice recognition software and may include unintentional dictation errors.  Nathan Kelley 8:26 AM --------------------------------------------------------------------------------------------------------------------------------------------------------------------------------------------------------------------------------------------    Subjective:    CC:  Chief Complaint    Patient presents with  . Arm Pain    left arm pain (around the elbow) x 5 days     HPI: Nathan Kelley is a 56 y.o. male who presents to [facility] today for issues as discussed below.  19th- was playing football and hyperextended his arm- felt tremenendous hot pain in distal elbow.     Wt Readings from Last 3 Encounters:  06/28/17 247 lb 14.4 oz (112.4 kg)  02/16/17 245 lb 6.4 oz (111.3 kg)  12/01/15 251 lb (113.9 kg)   BP Readings from Last 3 Encounters:  06/28/17 125/78  02/16/17 138/75  12/01/15 122/80   BMI Readings from Last 3 Encounters:  06/28/17 33.62 kg/m  02/16/17 33.28 kg/m  12/01/15 34.04 kg/m     Patient Care Team    Relationship Specialty Notifications Start End  Nathan Lot, DO PCP - General Family Medicine  02/16/17      Patient Active Problem List   Diagnosis Date Noted  . Essential hypertension 11/26/2009    Priority: High  . Pain and swelling of left elbow 06/28/2017  . Plantar fasciitis 02/16/2017  . Family history of diabetes mellitus- Mom and sister 02/16/2017  . Acute reaction to situational stress 02/16/2017  . Neuropathic pain of right foot 02/16/2017  . Glucose intolerance (impaired glucose tolerance) 02/16/2017  . Obesity 10/13/2015  . Numbness of foot 10/13/2015  . Sleep disorder 06/25/2014  . ANXIETY 07/13/2009  . TRANSIENT ISCHEMIC ATTACK, HX OF 12/23/2008  . GERD 10/10/2007  . Other dysphagia 10/10/2007    Past Medical history, Surgical history, Family history, Social history, Allergies and Medications have been entered into the  medical record, reviewed and changed as needed.    Current Meds  Medication Sig  . aspirin 325 MG tablet Take 325 mg by mouth daily.    Marland Kitchen. FLUoxetine (PROZAC) 20 MG capsule Take 1 capsule (20 mg total) by mouth daily.  . metoprolol tartrate (LOPRESSOR) 25 MG tablet Take 1 tablet (25 mg total) by mouth 2 (two) times daily.  . [DISCONTINUED] valACYclovir (VALTREX)  1000 MG tablet Take 1 tablet (1,000 mg total) by mouth 3 (three) times daily.    Allergies:  No Known Allergies   Review of Systems: General:   Denies fever, chills, unexplained weight loss.  Optho/Auditory:   Denies visual changes, blurred vision/LOV Respiratory:   Denies wheeze, DOE more than baseline levels.  Cardiovascular:   Denies chest pain, palpitations, new onset peripheral edema  Gastrointestinal:   Denies nausea, vomiting, diarrhea, abd pain.  Genitourinary: Denies dysuria, freq/ urgency, flank pain or discharge from genitals.  Endocrine:     Denies hot or cold intolerance, polyuria, polydipsia. Musculoskeletal:   Denies unexplained myalgias, joint swelling, unexplained arthralgias, gait problems.  Skin:  Denies new onset rash, suspicious lesions Neurological:     Denies dizziness, unexplained weakness, numbness  Psychiatric/Behavioral:   Denies mood changes, suicidal or homicidal ideations, hallucinations    Objective:   Blood pressure 125/78, pulse 64, height 6' (1.829 m), weight 247 lb 14.4 oz (112.4 kg). Body mass index is 33.62 kg/m. General:  Well Developed, well nourished, appropriate for stated age.  Neuro:  Alert and oriented,  extra-ocular muscles intact  HEENT:  Normocephalic, atraumatic, neck supple Skin:  no gross rash, warm, pink. Cardiac:  RRR, S1 S2 Respiratory:  ECTA B/L and A/P, Not using accessory muscles, speaking in full sentences- unlabored. Vascular:  Ext warm, no cyanosis apprec.; cap RF less 2 sec distally. M-Sk: Swelling distal upper arm near the antecubital fossa also some swelling just distal to this as well.  No ecchymosis.  Pain on flexion of the arm as well as internal/external rotation.  Diminished strength.  Neurovascular intact distally. Psych:  No HI/SI, judgement and insight good, Euthymic mood. Full Affect.

## 2017-07-08 ENCOUNTER — Other Ambulatory Visit: Payer: 59

## 2017-10-27 ENCOUNTER — Telehealth: Payer: Self-pay | Admitting: Family Medicine

## 2017-10-27 ENCOUNTER — Other Ambulatory Visit: Payer: Self-pay | Admitting: Family Medicine

## 2017-10-27 DIAGNOSIS — I10 Essential (primary) hypertension: Secondary | ICD-10-CM

## 2017-10-27 NOTE — Telephone Encounter (Signed)
[facility] [address] [facility] request for refill:  metoprolol tartrate (LOPRESSOR) 25 MG tablet [[id number]][[id number]]  Order Details  Dose: 25 mg Route: Oral Frequency: 2 times daily  Dispense Quantity: 180 tablet Refills: 0 Fills remaining: --        Sig: Take 1 tablet (25 mg total) by mouth 2 (two) times daily.     --advised would forward message to medical assistant/ provider for request---  Pt uses:  Preferred Pharmacies      [id number]- [place], [place] - [address] [phone] (Phone) [phone] (Fax)   ---glh

## 2017-10-30 NOTE — Telephone Encounter (Signed)
Medication refill was sent in earlier today. MPulliam, CMA/RT(R)

## 2017-11-06 ENCOUNTER — Ambulatory Visit (INDEPENDENT_AMBULATORY_CARE_PROVIDER_SITE_OTHER): Payer: 59 | Admitting: Family Medicine

## 2017-11-06 ENCOUNTER — Encounter: Payer: Self-pay | Admitting: Family Medicine

## 2017-11-06 VITALS — BP 126/75 | HR 65 | Ht 72.0 in | Wt 255.3 lb

## 2017-11-06 DIAGNOSIS — E1159 Type 2 diabetes mellitus with other circulatory complications: Secondary | ICD-10-CM

## 2017-11-06 DIAGNOSIS — N521 Erectile dysfunction due to diseases classified elsewhere: Secondary | ICD-10-CM

## 2017-11-06 DIAGNOSIS — I10 Essential (primary) hypertension: Secondary | ICD-10-CM | POA: Diagnosis not present

## 2017-11-06 DIAGNOSIS — Z8673 Personal history of transient ischemic attack (TIA), and cerebral infarction without residual deficits: Secondary | ICD-10-CM | POA: Diagnosis not present

## 2017-11-06 DIAGNOSIS — M79622 Pain in left upper arm: Secondary | ICD-10-CM

## 2017-11-06 DIAGNOSIS — E669 Obesity, unspecified: Secondary | ICD-10-CM | POA: Diagnosis not present

## 2017-11-06 DIAGNOSIS — E663 Overweight: Secondary | ICD-10-CM | POA: Insufficient documentation

## 2017-11-06 DIAGNOSIS — I152 Hypertension secondary to endocrine disorders: Secondary | ICD-10-CM

## 2017-11-06 DIAGNOSIS — E1169 Type 2 diabetes mellitus with other specified complication: Secondary | ICD-10-CM

## 2017-11-06 LAB — POCT GLYCOSYLATED HEMOGLOBIN (HGB A1C): Hemoglobin A1C: 8.2

## 2017-11-06 MED ORDER — METFORMIN HCL 500 MG PO TABS: 1000.0000 mg | ORAL_TABLET | Freq: Two times a day (BID) | ORAL | 1 refills | Status: DC

## 2017-11-06 MED ORDER — LOSARTAN POTASSIUM 100 MG PO TABS: 100.0000 mg | ORAL_TABLET | Freq: Every day | ORAL | 0 refills | Status: DC

## 2017-11-06 MED ORDER — AMBULATORY NON FORMULARY MEDICATION: 0 refills | Status: DC
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
## 2017-11-06 NOTE — Patient Instructions (Signed)
Patient needs urine microalbumin prior to leaving office today.    GOALS :  The goals are for the Hgb A1C to be less than 6.5 & blood pressure to be less than 120/80.    It is recommended that all diabetics are educated on and follow a healthy diabetic diet, exercise for 30 minutes 3-4 times per week (walking, biking, swimming, or machine), monitor blood glucose readings and bring that record with you to be reviewed at your next office visit.     You should be checking fasting blood sugars- especially after you eat poorly or eat really healthy, and also check 2 hour postprandial blood sugars after largest meal of the day.    Write these down and bring in your log at each office visit.    You will need to be seen every 3 months by the provider managing your Diabetes unless told otherwise by that provider.   You will need yearly eye exams from an eye specialist and foot exams to check the nerves of your feet.  Also, your urine should be checked yearly as well to make sure excess protein is not present.   If you are checking your blood pressure at home, please record it and bring it to your next office visit.    Follow the Dietary Approaches to Stop Hypertension (DASH) diet (3 servings of fruit and vegetables daily, whole grains, low sodium, low-fat proteins).  See below.    Lastly, when it comes to your cholesterol, the goal is to have the HDL (good cholesterol) >40, and the LDL (bad cholesterol) <100.   It is recommended that you follow a heart healthy, low saturated and trans-fat diet and exercise for 30 minutes at least 5 times a week.     (( Check out the DASH diet = 1.5 Gram Low Sodium Diet   A 1.5 gram sodium diet restricts the amount of sodium in the diet to no more than 1.5 g or 1500 mg daily.  The American Heart Association recommends Americans over the age of 56 to consume no more than 1500 mg of sodium each day to reduce the risk of developing high blood pressure.  Research also  shows that limiting sodium may reduce heart attack and stroke risk.  Many foods contain sodium for flavor and sometimes as a preservative.  When the amount of sodium in a diet needs to be low, it is important to know what to look for when choosing foods and drinks.  The following includes some information and guidelines to help make it easier for you to adapt to a low sodium diet.    QUICK TIPS  Do not add salt to food.  Avoid convenience items and fast food.  Choose unsalted snack foods.  Buy lower sodium products, often labeled as "lower sodium" or "no salt added."  Check food labels to learn how much sodium is in 1 serving.  When eating at a restaurant, ask that your food be prepared with less salt or none, if possible.    READING FOOD LABELS FOR SODIUM INFORMATION  The nutrition facts label is a good place to find how much sodium is in foods. Look for products with no more than 400 mg of sodium per serving.  Remember that 1.5 g = 1500 mg.  The food label may also list foods as:  Sodium-free: Less than 5 mg in a serving.  Very low sodium: 35 mg or less in a serving.  Low-sodium: 140 mg or  less in a serving.  Light in sodium: 50% less sodium in a serving. For example, if a food that usually has 300 mg of sodium is changed to become light in sodium, it will have 150 mg of sodium.  Reduced sodium: 25% less sodium in a serving. For example, if a food that usually has 400 mg of sodium is changed to reduced sodium, it will have 300 mg of sodium.    CHOOSING FOODS  Grains  Avoid: Salted crackers and snack items. Some cereals, including instant hot cereals. Bread stuffing and biscuit mixes. Seasoned rice or pasta mixes.  Choose: Unsalted snack items. Low-sodium cereals, oats, puffed wheat and rice, shredded wheat. English muffins and bread. Pasta.  Meats  Avoid: Salted, canned, smoked, spiced, pickled meats, including fish and poultry. Bacon, ham, sausage, cold cuts, hot dogs, anchovies.    Choose: Low-sodium canned tuna and salmon. Fresh or frozen meat, poultry, and fish.  Dairy  Avoid: Processed cheese and spreads. Cottage cheese. Buttermilk and condensed milk. Regular cheese.  Choose: Milk. Low-sodium cottage cheese. Yogurt. Sour cream. Low-sodium cheese.  Fruits and Vegetables  Avoid: Regular canned vegetables. Regular canned tomato sauce and paste. Frozen vegetables in sauces. Olives. Angie Fava. Relishes. Sauerkraut.  Choose: Low-sodium canned vegetables. Low-sodium tomato sauce and paste. Frozen or fresh vegetables. Fresh and frozen fruit.  Condiments  Avoid: Canned and packaged gravies. Worcestershire sauce. Tartar sauce. Barbecue sauce. Soy sauce. Steak sauce. Ketchup. Onion, garlic, and table salt. Meat flavorings and tenderizers.  Choose: Fresh and dried herbs and spices. Low-sodium varieties of mustard and ketchup. Lemon juice. Tabasco sauce. Horseradish.    SAMPLE 1.5 GRAM SODIUM MEAL PLAN:   Breakfast / Sodium (mg)  1 cup low-fat milk / 143 mg  1 whole-wheat English muffin / 240 mg  1 tbs heart-healthy margarine / 153 mg  1 hard-boiled egg / 139 mg  1 small orange / 0 mg  Lunch / Sodium (mg)  1 cup raw carrots / 76 mg  2 tbs no salt added peanut butter / 5 mg  2 slices whole-wheat bread / 270 mg  1 tbs jelly / 6 mg   cup red grapes / 2 mg  Dinner / Sodium (mg)  1 cup whole-wheat pasta / 2 mg  1 cup low-sodium tomato sauce / 73 mg  3 oz lean ground beef / 57 mg  1 small side salad (1 cup raw spinach leaves,  cup cucumber,  cup yellow bell pepper) with 1 tsp olive oil and 1 tsp red wine vinegar / 25 mg  Snack / Sodium (mg)  1 container low-fat vanilla yogurt / 107 mg  3 graham cracker squares / 127 mg  Nutrient Analysis  Calories: 1745  Protein: 75 g  Carbohydrate: 237 g  Fat: 57 g  Sodium: 1425 mg  Document Released: 08/22/2005 Document Revised: 05/04/2011 Document Reviewed: 11/23/2009  ExitCare Patient Information 2012 [place].))        Risk factors for prediabetes and type 2 diabetes Researchers don't fully understand why some people develop prediabetes and type 2 diabetes and others don't.  It's clear that certain factors increase the risk, however, including:  Weight. The more fatty tissue you have, the more resistant your cells become to insulin.  Inactivity. The less active you are, the greater your risk. Physical activity helps you control your weight, uses up glucose as energy and makes your cells more sensitive to insulin.  Family history. Your risk increases if a parent or sibling has  type 2 diabetes.  Race. Although it's unclear why, people of certain races - including blacks, Hispanics, American Indians and Asian-Americans - are at higher risk.  Age. Your risk increases as you get older. This may be because you tend to exercise less, lose muscle mass and gain weight as you age. But type 2 diabetes is also increasing dramatically among children, adolescents and younger adults.  Gestational diabetes. If you developed gestational diabetes when you were pregnant, your risk of developing prediabetes and type 2 diabetes later increases. If you gave birth to a baby weighing more than 9 pounds (4 kilograms), you're also at risk of type 2 diabetes.  Polycystic ovary syndrome. For women, having polycystic ovary syndrome - a common condition characterized by irregular menstrual periods, excess hair growth and obesity - increases the risk of diabetes.  High blood pressure. Having blood pressure over 140/90 millimeters of mercury (mm Hg) is linked to an increased risk of type 2 diabetes.  Abnormal cholesterol and triglyceride levels. If you have low levels of high-density lipoprotein (HDL), or "good," cholesterol, your risk of type 2 diabetes is higher. Triglycerides are another type of fat carried in the blood. People with high levels of triglycerides have an increased risk of type 2 diabetes. Your doctor can let you know what  your cholesterol and triglyceride levels are.  A good guide to good carbs: The glycemic index ---If you have diabetes, or at risk for diabetes, you know all too well that when you eat carbohydrates, your blood sugar goes up. The total amount of carbs you consume at a meal or in a snack mostly determines what your blood sugar will do. But the food itself also plays a role. A serving of white rice has almost the same effect as eating pure table sugar - a quick, high spike in blood sugar. A serving of lentils has a slower, smaller effect.  ---Picking good sources of carbs can help you control your blood sugar and your weight. Even if you don't have diabetes, eating healthier carbohydrate-rich foods can help ward off a host of chronic conditions, from heart disease to various cancers to, well, diabetes.  ---One way to choose foods is with the glycemic index (GI). This tool measures how much a food boosts blood sugar.  The glycemic index rates the effect of a specific amount of a food on blood sugar compared with the same amount of pure glucose. A food with a glycemic index of 28 boosts blood sugar only 28% as much as pure glucose. One with a GI of 95 acts like pure glucose.    High glycemic foods result in a quick spike in insulin and blood sugar (also known as blood glucose).  Low glycemic foods have a slower, smaller effect- these are healthier for you.   Using the glycemic index Using the glycemic index is easy: choose foods in the low GI category instead of those in the high GI category (see below), and go easy on those in between. Low glycemic index (GI of 55 or less): Most fruits and vegetables, beans, minimally processed grains, pasta, low-fat dairy foods, and nuts.  Moderate glycemic index (GI 56 to 69): White and sweet potatoes, corn, white rice, couscous, breakfast cereals such as Cream of Wheat and Mini Wheats.  High glycemic index (GI of 70 or higher): White bread, rice cakes, most crackers,  bagels, cakes, doughnuts, croissants, most packaged breakfast cereals. You can see the values for 100 commons foods and get links  to more at www.[email or web address].si.  Swaps for lowering glycemic index  Instead of this high-glycemic index food Eat this lower-glycemic index food  White rice Brown rice or converted rice  Instant oatmeal Steel-cut oats  Cornflakes Bran flakes  Baked potato Pasta, bulgur  White bread Whole-grain bread  Corn Peas or leafy greens         diabetes Eating Plan  Prediabetes--also called impaired glucose tolerance or impaired fasting glucose--is a condition that causes blood sugar (blood glucose) levels to be higher than normal. Following a healthy diet can help to keep prediabetes under control. It can also help to lower the risk of type 2 diabetes and heart disease, which are increased in people who have prediabetes. Along with regular exercise, a healthy diet:  Promotes weight loss.  Helps to control blood sugar levels.  Helps to improve the way that the body uses insulin.   WHAT DO I NEED TO KNOW ABOUT THIS EATING PLAN?   Use the glycemic index (GI) to plan your meals. The index tells you how quickly a food will raise your blood sugar. Choose low-GI foods. These foods take a longer time to raise blood sugar.  Pay close attention to the amount of carbohydrates in the food that you eat. Carbohydrates increase blood sugar levels.  Keep track of how many calories you take in. Eating the right amount of calories will help you to achieve a healthy weight. Losing about 7 percent of your starting weight can help to prevent type 2 diabetes.  You may want to follow a Mediterranean diet. This diet includes a lot of vegetables, lean meats or fish, whole grains, fruits, and healthy oils and fats.   WHAT FOODS CAN I EAT?  Grains Whole grains, such as whole-wheat or whole-grain breads, crackers, cereals, and pasta. Unsweetened oatmeal. Bulgur. Barley.  Quinoa. Brown rice. Corn or whole-wheat flour tortillas or taco shells. Vegetables Lettuce. Spinach. Peas. Beets. Cauliflower. Cabbage. Broccoli. Carrots. Tomatoes. Squash. Eggplant. Herbs. Peppers. Onions. Cucumbers. Brussels sprouts. Fruits Berries. Bananas. Apples. Oranges. Grapes. Papaya. Mango. Pomegranate. Kiwi. Grapefruit. Cherries. Meats and Other Protein Sources Seafood. Lean meats, such as chicken and [place] or lean cuts of pork and beef. Tofu. Eggs. Nuts. Beans. Dairy Low-fat or fat-free dairy products, such as yogurt, cottage cheese, and cheese. Beverages Water. Tea. Coffee. Sugar-free or diet soda. Seltzer water. Milk. Milk alternatives, such as soy or almond milk. Condiments Mustard. Relish. Low-fat, low-sugar ketchup. Low-fat, low-sugar barbecue sauce. Low-fat or fat-free mayonnaise. Sweets and Desserts Sugar-free or low-fat pudding. Sugar-free or low-fat ice cream and other frozen treats. Fats and Oils Avocado. Walnuts. Olive oil. The items listed above may not be a complete list of recommended foods or beverages. Contact your dietitian for more options.    WHAT FOODS ARE NOT RECOMMENDED?  Grains Refined white flour and flour products, such as bread, pasta, snack foods, and cereals. Beverages Sweetened drinks, such as sweet iced tea and soda. Sweets and Desserts Baked goods, such as cake, cupcakes, pastries, cookies, and cheesecake. The items listed above may not be a complete list of foods and beverages to avoid. Contact your dietitian for more information.   This information is not intended to replace advice given to you by your health care provider. Make sure you discuss any questions you have with your health care provider.   Document Released: 01/06/2015 Document Reviewed: 01/06/2015 Elsevier Interactive Patient Education 2016 [employer].         Diabetes Mellitus and Standards of Medical Care  Managing diabetes (diabetes mellitus) can be  complicated. Your diabetes treatment may be managed by a team of health care providers, including:  A diet and nutrition specialist (registered dietitian).  A nurse.  A certified diabetes educator (CDE).  A diabetes specialist (endocrinologist).  An eye doctor.  A primary care provider.  A dentist.  Your health care providers follow a schedule in order to help you get the best quality of care. The following schedule is a general guideline for your diabetes management plan. Your health care providers may also give you more specific instructions.  HbA1c (hemoglobin A1c) test This test provides information about blood sugar (glucose) control over the previous 2-3 months. It is used to check whether your diabetes management plan needs to be adjusted.  If you are meeting your treatment goals, this test is done at least 2 times a year.  If you are not meeting treatment goals or if your treatment goals have changed, this test is done 4 times a year.  Blood pressure test  This test is done at every routine medical visit. For most people, the goal is less than 130/80. Ask your health care provider what your goal blood pressure should be.  Dental and eye exams  Visit your dentist two times a year.  If you have type 1 diabetes, get an eye exam 3-5 years after you are diagnosed, and then once a year after your first exam. ? If you were diagnosed with type 1 diabetes as a child, get an eye exam when you are age 36 or older and have had diabetes for 3-5 years. After the first exam, you should get an eye exam once a year.  If you have type 2 diabetes, have an eye exam as soon as you are diagnosed, and then once a year after your first exam.  Foot care exam  Visual foot exams are done at every routine medical visit. The exams check for cuts, bruises, redness, blisters, sores, or other problems with the feet.  A complete foot exam is done by your health care provider once a year. This exam  includes an inspection of the structure and skin of your feet, and a check of the pulses and sensation in your feet. ? Type 1 diabetes: Get your first exam 3-5 years after diagnosis. ? Type 2 diabetes: Get your first exam as soon as you are diagnosed.  Check your feet every day for cuts, bruises, redness, blisters, or sores. If you have any of these or other problems that are not healing, contact your health care provider.  Kidney function test (urine microalbumin)  This test is done once a year. ? Type 1 diabetes: Get your first test 5 years after diagnosis. ? Type 2 diabetes: Get your first test as soon as you are diagnosed._  If you have chronic kidney disease (CKD), get a serum creatinine and estimated glomerular filtration rate (eGFR) test once a year.  Lipid profile (cholesterol, HDL, LDL, triglycerides)  This test should be done when you are diagnosed with diabetes, and every 5 years after the first test. If you are on medicines to lower your cholesterol, you may need to get this test done every year. ? The goal for LDL is less than 100 mg/dL (5.5 mmol/L). If you are at high risk, the goal is less than 70 mg/dL (3.9 mmol/L). ? The goal for HDL is 40 mg/dL (2.2 mmol/L) for men and 50 mg/dL(2.8 mmol/L) for women. An HDL cholesterol of 60  mg/dL (3.3 mmol/L) or higher gives some protection against heart disease. ? The goal for triglycerides is less than 150 mg/dL (8.3 mmol/L).  Immunizations  The yearly flu (influenza) vaccine is recommended for everyone 6 months or older who has diabetes.  The pneumonia (pneumococcal) vaccine is recommended for everyone 2 years or older who has diabetes. If you are 25 or older, you may get the pneumonia vaccine as a series of two separate shots.  The hepatitis B vaccine is recommended for adults shortly after they have been diagnosed with diabetes.  The Tdap (tetanus, diphtheria, and pertussis) vaccine should be given: ? According to normal  childhood vaccination schedules, for children. ? Every 10 years, for adults who have diabetes.  The shingles vaccine is recommended for people who have had chicken pox and are 50 years or older.  Mental and emotional health  Screening for symptoms of eating disorders, anxiety, and depression is recommended at the time of diagnosis and afterward as needed. If your screening shows that you have symptoms (you have a positive screening result), you may need further evaluation and be referred to a mental health care provider.  Diabetes self-management education  Education about how to manage your diabetes is recommended at diagnosis and ongoing as needed.  Treatment plan  Your treatment plan will be reviewed at every medical visit.  Summary  Managing diabetes (diabetes mellitus) can be complicated. Your diabetes treatment may be managed by a team of health care providers.  Your health care providers follow a schedule in order to help you get the best quality of care.  Standards of care including having regular physical exams, blood tests, blood pressure monitoring, immunizations, screening tests, and education about how to manage your diabetes.  Your health care providers may also give you more specific instructions based on your individual health.      Type 2 Diabetes Mellitus, Self Care, Adult Caring for yourself after you have been diagnosed with type 2 diabetes (type 2 diabetes mellitus) means keeping your blood sugar (glucose) under control with a balance of:  Nutrition.  Exercise.  Lifestyle changes.  Medicines or insulin, if necessary.  Support from your team of health care providers and others.  The following information explains what you need to know to manage your diabetes at home. What do I need to do to manage my blood glucose?  Check your blood glucose every day, as often as told by your health care provider.  Contact your health care provider if your blood  glucose is above your target for 2 tests in a row.  Have your A1c (hemoglobin A1c) level checked at least two times a year, or as often as told by your health care provider. Your health care provider will set individualized treatment goals for you. Generally, the goal of treatment is to maintain the following blood glucose levels:  Before meals (preprandial): 80-130 mg/dL (4.4-7.2 mmol/L).  After meals (postprandial): below 180 mg/dL (10 mmol/L).  A1c level: less than 7%.  What do I need to know about hyperglycemia and hypoglycemia? What is hyperglycemia? Hyperglycemia, also called high blood glucose, occurs when blood glucose is too high.Make sure you know the early signs of hyperglycemia, such as:  Increased thirst.  Hunger.  Feeling very tired.  Needing to urinate more often than usual.  Blurry vision.  What is hypoglycemia? Hypoglycemia, also called low blood glucose, occurswith a blood glucose level at or below 70 mg/dL (3.9 mmol/L). The risk for hypoglycemia increases during or  after exercise, during sleep, during illness, and when skipping meals or not eating for a long time (fasting). It is important to know the symptoms of hypoglycemia and treat it right away. Always have a 15-gram rapid-acting carbohydrate snack with you to treat low blood glucose. Family members and close friends should also know the symptoms and should understand how to treat hypoglycemia, in case you are not able to treat yourself. What are the symptoms of hypoglycemia? Hypoglycemia symptoms can include:  Hunger.  Anxiety.  Sweating and feeling clammy.  Confusion.  Dizziness or feeling light-headed.  Sleepiness.  Nausea.  Increased heart rate.  Headache.  Blurry vision.  Seizure.  Nightmares.  Tingling or numbness around the mouth, lips, or tongue.  A change in speech.  Decreased ability to concentrate.  A change in coordination.  Restless sleep.  Tremors or  shakes.  Fainting.  Irritability.  How do I treat hypoglycemia?  If you are alert and able to swallow safely, follow the 15:15 rule:  Take 15 grams of a rapid-acting carbohydrate. Rapid-acting options include: ? 1 tube of glucose gel. ? 3 glucose pills. ? 6-8 pieces of hard candy. ? 4 oz (120 mL) of fruit juice. ? 4 oz (120 mL) of regular (not diet) soda.  Check your blood glucose 15 minutes after you take the carbohydrate.  If the repeat blood glucose level is still at or below 70 mg/dL (3.9 mmol/L), take 15 grams of a carbohydrate again.  If your blood glucose level does not increase above 70 mg/dL (3.9 mmol/L) after 3 tries, seek emergency medical care.  After your blood glucose level returns to normal, eat a meal or a snack within 1 hour.  How do I treat severe hypoglycemia? Severe hypoglycemia is when your blood glucose level is at or below 54 mg/dL (3 mmol/L). Severe hypoglycemia is an emergency. Do not wait to see if the symptoms will go away. Get medical help right away. Call your local emergency services (911 in the [place] Do not drive yourself to the hospital. If you have severe hypoglycemia and you cannot eat or drink, you may need an injection of glucagon. A family member or close friend should learn how to check your blood glucose and how to give you a glucagon injection. Ask your health care provider if you need to have an emergency glucagon injection kit available. Severe hypoglycemia may need to be treated in a hospital. The treatment may include getting glucose through an IV tube. You may also need treatment for the cause of your hypoglycemia. Can having diabetes put me at risk for other conditions? Having diabetes can put you at risk for other long-term (chronic) conditions, such as heart disease and kidney disease. Your health care provider may prescribe medicines to help prevent complications from diabetes. These medicines may include:  Aspirin.  Medicine to  lower cholesterol.  Medicine to control blood pressure.  What else can I do to manage my diabetes? Take your diabetes medicines as told  If your health care provider prescribed insulin or diabetes medicines, take them every day.  Do not run out of insulin or other diabetes medicines that you take. Plan ahead so you always have these available.  If you use insulin, adjust your dosage based on how physically active you are and what foods you eat. Your health care provider will tell you how to adjust your dosage. Make healthy food choices  The things that you eat and drink affect your blood glucose and  your insulin dosage. Making good choices helps to control your diabetes and prevent other health problems. A healthy meal plan includes eating lean proteins, complex carbohydrates, fresh fruits and vegetables, low-fat dairy products, and healthy fats. Make an appointment to see a diet and nutrition specialist (registered dietitian) to help you create an eating plan that is right for you. Make sure that you:  Follow instructions from your health care provider about eating or drinking restrictions.  Drink enough fluid to keep your urine clear or pale yellow.  Eat healthy snacks between nutritious meals.  Track the carbohydrates that you eat. Do this by reading food labels and learning the standard serving sizes of foods.  Follow your sick day plan whenever you cannot eat or drink as usual. Make this plan in advance with your health care provider.  Stay active  Exercise regularly, as told by your health care provider. This may include:  Stretching and doing strength exercises, such as yoga or weightlifting, at least 2 times a week.  Doing at least 150 minutes of moderate-intensity or vigorous-intensity exercise each week. This could be brisk walking, biking, or water aerobics. ? Spread out your activity over at least 3 days of the week. ? Do not go more than 2 days in a row without doing  some kind of physical activity.  When you start a new exercise or activity, work with your health care provider to adjust your insulin, medicines, or food intake as needed. Make healthy lifestyle choices  Do not use any tobacco products, such as cigarettes, chewing tobacco, and e-cigarettes. If you need help quitting, ask your health care provider.  If your health care provider says that alcohol is safe for you, limit alcohol intake to no more than 1 drink per day for nonpregnant women and 2 drinks per day for men. One drink equals 12 oz of beer, 5 oz of wine, or 1 oz of hard liquor.  Learn to manage stress. If you need help with this, ask your health care provider. Care for your body   Keep your immunizations up to date. In addition to getting vaccinations as told by your health care provider, it is recommended that you get vaccinated against the following illnesses: ? The flu (influenza). Get a flu shot every year. ? Pneumonia. ? Hepatitis B.  Schedule an eye exam soon after your diagnosis, and then one time every year after that.  Check your skin and feet every day for cuts, bruises, redness, blisters, or sores. Schedule a foot exam with your health care provider once every year.  Brush your teeth and gums two times a day, and floss at least one time a day. Visit your dentist at least once every 6 months.  Maintain a healthy weight. General instructions  Take over-the-counter and prescription medicines only as told by your health care provider.  Share your diabetes management plan with people in your workplace, school, and household.  Check your urine for ketones when you are ill and as told by your health care provider.  Ask your health care provider: ? Do I need to meet with a diabetes educator? ? Where can I find a support group for people with diabetes?  Carry a medical alert card or wear medical alert jewelry.  Keep all follow-up visits as told by your health care  provider. This is important. Where to find more information: For more information about diabetes, visit:  American Diabetes Association (ADA): www.diabetes.org  American Association of Diabetes Educators (  AADE): www.diabeteseducator.org/patient-resources  This information is not intended to replace advice given to you by your health care provider. Make sure you discuss any questions you have with your health care provider. Document Released: 12/14/2015 Document Revised: 01/28/2016 Document Reviewed: 09/25/2015 Elsevier Interactive Patient Education  2017 [place].      Blood Glucose Monitoring, Adult Monitoring your blood sugar (glucose) helps you manage your diabetes. It also helps you and your health care provider determine how well your diabetes management plan is working. Blood glucose monitoring involves checking your blood glucose as often as directed, and keeping a record (log) of your results over time. Why should I monitor my blood glucose? Checking your blood glucose regularly can:  Help you understand how food, exercise, illnesses, and medicines affect your blood glucose.  Let you know what your blood glucose is at any time. You can quickly tell if you are having low blood glucose (hypoglycemia) or high blood glucose (hyperglycemia).  Help you and your health care provider adjust your medicines as needed.  When should I check my blood glucose? Follow instructions from your health care provider about how often to check your blood glucose.   This may depend on:  The type of diabetes you have.  How well-controlled your diabetes is.  Medicines you are taking.  If you have type 1 diabetes:  Check your blood glucose at least 2 times a day.  Also check your blood glucose: ? Before every insulin injection. ? Before and after exercise. ? Between meals. ? 2 hours after a meal. ? Occasionally between 2:00 a.m. and 3:00 a.m., as directed. ? Before potentially  dangerous tasks, like driving or using heavy machinery. ? At bedtime.  You may need to check your blood glucose more often, up to 6-10 times a day: ? If you use an insulin pump. ? If you need multiple daily injections (MDI). ? If your diabetes is not well-controlled. ? If you are ill. ? If you have a history of severe hypoglycemia. ? If you have a history of not knowing when your blood glucose is getting low (hypoglycemia unawareness).  If you have type 2 diabetes:  If you take insulin or other diabetes medicines, check your blood glucose at least 2 times a day.  If you are on intensive insulin therapy, check your blood glucose at least 4 times a day. Occasionally, you may also need to check between 2:00 a.m. and 3:00 a.m., as directed.  Also check your blood glucose: ? Before and after exercise. ? Before potentially dangerous tasks, like driving or using heavy machinery.  You may need to check your blood glucose more often if: ? Your medicine is being adjusted. ? Your diabetes is not well-controlled. ? You are ill.  What is a blood glucose log?  A blood glucose log is a record of your blood glucose readings. It helps you and your health care provider: ? Look for patterns in your blood glucose over time. ? Adjust your diabetes management plan as needed.  Every time you check your blood glucose, write down your result and notes about things that may be affecting your blood glucose, such as your diet and exercise for the day.  Most glucose meters store a record of glucose readings in the meter. Some meters allow you to download your records to a computer. How do I check my blood glucose? Follow these steps to get accurate readings of your blood glucose: Supplies needed   Blood glucose meter.  Test strips for your meter. Each meter has its own strips. You must use the strips that come with your meter.  A needle to prick your finger (lancet). Do not use lancets more than  once.  A device that holds the lancet (lancing device).  A journal or log book to write down your results.  Procedure  Wash your hands with soap and water.  Prick the side of your finger (not the tip) with the lancet. Use a different finger each time.  Gently rub the finger until a small drop of blood appears.  Follow instructions that come with your meter for inserting the test strip, applying blood to the strip, and using your blood glucose meter.  Write down your result and any notes.  Alternative testing sites  Some meters allow you to use areas of your body other than your finger (alternative sites) to test your blood.  If you think you may have hypoglycemia, or if you have hypoglycemia unawareness, do not use alternative sites. Use your finger instead.  Alternative sites may not be as accurate as the fingers, because blood flow is slower in these areas. This means that the result you get may be delayed, and it may be different from the result that you would get from your finger.  The most common alternative sites are: ? Forearm. ? Thigh. ? Palm of the hand.  Additional tips  Always keep your supplies with you.  If you have questions or need help, all blood glucose meters have a 24-hour "hotline" number that you can call. You may also contact your health care provider.  After you use a few boxes of test strips, adjust (calibrate) your blood glucose meter by following instructions that came with your meter.    The American Diabetes Association suggests the following targets for most nonpregnant adults with diabetes.  More or less stringent glycemic goals may be appropriate for each individual.  A1C: Less than 7% A1C may also be reported as eAG: Less than 154 mg/dl Before a meal (preprandial plasma glucose): 80-130 mg/dl 1-2 hours after beginning of the meal (Postprandial plasma glucose)*: Less than 180 mg/dl  *Postprandial glucose may be targeted if A1C goals are  not met despite reaching preprandial glucose goals.   GOALS in short:  The goals are for the Hgb A1C to be less than 7.0 & blood pressure to be less than 130/80.    It is recommended that all diabetics are educated on and follow a healthy diabetic diet, exercise for 30 minutes 3-4 times per week (walking, biking, swimming, or machine), monitor blood glucose readings and bring that record with you to be reviewed at your next office visit.     You should be checking fasting blood sugars- especially after you eat poorly or eat really healthy, and also check 2 hour postprandial blood sugars after largest meal of the day.    Write these down and bring in your log at each office visit.    You will need to be seen every 3 months by the provider managing your Diabetes unless told otherwise by that provider.   You will need yearly eye exams from an eye specialist and foot exams to check the nerves of your feet.  Also, your urine should be checked yearly as well to make sure excess protein is not present.   If you are checking your blood pressure at home, please record it and bring it to your next office visit.  Follow the Dietary Approaches to Stop Hypertension (DASH) diet (3 servings of fruit and vegetables daily, whole grains, low sodium, low-fat proteins).  See below.    Lastly, when it comes to your cholesterol, the goal is to have the HDL (good cholesterol) >40, and the LDL (bad cholesterol) <100.   It is recommended that you follow a heart healthy, low saturated and trans-fat diet and exercise for 30 minutes at least 5 times a week.     (( Check out the DASH diet = 1.5 Gram Low Sodium Diet   A 1.5 gram sodium diet restricts the amount of sodium in the diet to no more than 1.5 g or 1500 mg daily.  The American Heart Association recommends Americans over the age of 14 to consume no more than 1500 mg of sodium each day to reduce the risk of developing high blood pressure.  Research also shows that  limiting sodium may reduce heart attack and stroke risk.  Many foods contain sodium for flavor and sometimes as a preservative.  When the amount of sodium in a diet needs to be low, it is important to know what to look for when choosing foods and drinks.  The following includes some information and guidelines to help make it easier for you to adapt to a low sodium diet.    QUICK TIPS  Do not add salt to food.  Avoid convenience items and fast food.  Choose unsalted snack foods.  Buy lower sodium products, often labeled as "lower sodium" or "no salt added."  Check food labels to learn how much sodium is in 1 serving.  When eating at a restaurant, ask that your food be prepared with less salt or none, if possible.    READING FOOD LABELS FOR SODIUM INFORMATION  The nutrition facts label is a good place to find how much sodium is in foods. Look for products with no more than 400 mg of sodium per serving.  Remember that 1.5 g = 1500 mg.  The food label may also list foods as:  Sodium-free: Less than 5 mg in a serving.  Very low sodium: 35 mg or less in a serving.  Low-sodium: 140 mg or less in a serving.  Light in sodium: 50% less sodium in a serving. For example, if a food that usually has 300 mg of sodium is changed to become light in sodium, it will have 150 mg of sodium.  Reduced sodium: 25% less sodium in a serving. For example, if a food that usually has 400 mg of sodium is changed to reduced sodium, it will have 300 mg of sodium.    CHOOSING FOODS  Grains  Avoid: Salted crackers and snack items. Some cereals, including instant hot cereals. Bread stuffing and biscuit mixes. Seasoned rice or pasta mixes.  Choose: Unsalted snack items. Low-sodium cereals, oats, puffed wheat and rice, shredded wheat. English muffins and bread. Pasta.  Meats  Avoid: Salted, canned, smoked, spiced, pickled meats, including fish and poultry. Bacon, ham, sausage, cold cuts, hot dogs, anchovies.  Choose:  Low-sodium canned tuna and salmon. Fresh or frozen meat, poultry, and fish.  Dairy  Avoid: Processed cheese and spreads. Cottage cheese. Buttermilk and condensed milk. Regular cheese.  Choose: Milk. Low-sodium cottage cheese. Yogurt. Sour cream. Low-sodium cheese.  Fruits and Vegetables  Avoid: Regular canned vegetables. Regular canned tomato sauce and paste. Frozen vegetables in sauces. Olives. Angie Fava. Relishes. Sauerkraut.  Choose: Low-sodium canned vegetables. Low-sodium tomato sauce and paste. Frozen or fresh  vegetables. Fresh and frozen fruit.  Condiments  Avoid: Canned and packaged gravies. Worcestershire sauce. Tartar sauce. Barbecue sauce. Soy sauce. Steak sauce. Ketchup. Onion, garlic, and table salt. Meat flavorings and tenderizers.  Choose: Fresh and dried herbs and spices. Low-sodium varieties of mustard and ketchup. Lemon juice. Tabasco sauce. Horseradish.    SAMPLE 1.5 GRAM SODIUM MEAL PLAN:   Breakfast / Sodium (mg)  1 cup low-fat milk / 143 mg  1 whole-wheat English muffin / 240 mg  1 tbs heart-healthy margarine / 153 mg  1 hard-boiled egg / 139 mg  1 small orange / 0 mg  Lunch / Sodium (mg)  1 cup raw carrots / 76 mg  2 tbs no salt added peanut butter / 5 mg  2 slices whole-wheat bread / 270 mg  1 tbs jelly / 6 mg   cup red grapes / 2 mg  Dinner / Sodium (mg)  1 cup whole-wheat pasta / 2 mg  1 cup low-sodium tomato sauce / 73 mg  3 oz lean ground beef / 57 mg  1 small side salad (1 cup raw spinach leaves,  cup cucumber,  cup yellow bell pepper) with 1 tsp olive oil and 1 tsp red wine vinegar / 25 mg  Snack / Sodium (mg)  1 container low-fat vanilla yogurt / 107 mg  3 graham cracker squares / 127 mg  Nutrient Analysis  Calories: 1745  Protein: 75 g  Carbohydrate: 237 g  Fat: 57 g  Sodium: 1425 mg  Document Released: 08/22/2005 Document Revised: 05/04/2011 Document Reviewed: 11/23/2009  ExitCare Patient Information 2012 [place].))    This  information is not intended to replace advice given to you by your health care provider. Make sure you discuss any questions you have with your health care provider. Document Released: 08/25/2003 Document Revised: 03/11/2016 Document Reviewed: 02/01/2016 Elsevier Interactive Patient Education  2017 [employer].

## 2017-11-07 ENCOUNTER — Ambulatory Visit: Payer: 59 | Admitting: Family Medicine

## 2017-11-07 ENCOUNTER — Telehealth: Payer: Self-pay | Admitting: Family Medicine

## 2017-11-07 ENCOUNTER — Other Ambulatory Visit: Payer: Self-pay | Admitting: Orthopaedic Surgery

## 2017-11-07 DIAGNOSIS — M25522 Pain in left elbow: Secondary | ICD-10-CM

## 2017-11-07 NOTE — Telephone Encounter (Signed)
I reviewed last office visit and am not sure of the directions that you gave the patient, please advise.  Thank you. MPulliam, CMA/RT(R)

## 2017-11-07 NOTE — Telephone Encounter (Signed)
Called patient advised per Dr. Sharee Holsterpalski to take 1/2 tablet for 2-3 days and if tolerated well move to 1 tablet at night for 2-3 days and if tolerated well then move to the bid. MPulliam, CMA/RT(R)

## 2017-11-07 NOTE — Telephone Encounter (Signed)
[facility] and [facility] called pt to discuss.

## 2017-11-07 NOTE — Telephone Encounter (Signed)
Patient called states he is confused about taking the 1/2 tablet of metformin & when to move to a full/ whole tablets ---- patient request medical assistant or provider call him to clarify instructions .  ---Pt's phone# [phone].  --glh

## 2017-11-08 LAB — MICROALBUMIN / CREATININE URINE RATIO
CREATININE, UR: 103.2 mg/dL
Microalb/Creat Ratio: 3.4 mg/g creat (ref 0.0–30.0)
Microalbumin, Urine: 3.5 ug/mL

## 2017-11-13 ENCOUNTER — Ambulatory Visit
Admission: RE | Admit: 2017-11-13 | Discharge: 2017-11-13 | Disposition: A | Discharge status: Home / Self Care | Payer: 59 | Source: Ambulatory Visit | Attending: Orthopaedic Surgery | Admitting: Orthopaedic Surgery

## 2017-11-13 ENCOUNTER — Telehealth: Payer: Self-pay | Admitting: Family Medicine

## 2017-11-13 DIAGNOSIS — M25522 Pain in left elbow: Secondary | ICD-10-CM

## 2017-11-13 IMAGING — MR MR ELBOW*L* W/O CM
4 series · 40 of 40 positions shown · non-contrast
Comparison: None.

CLINICAL DATA: Left elbow pain. Injured lifting an object [DATE].

EXAM:
MRI OF THE LEFT ELBOW WITHOUT CONTRAST
TECHNIQUE: Multiplanar, multisequence MR imaging of the elbow was performed. No
intravenous contrast was administered.

[Series 5: T1 · axial · left · 3.0mm · 0.44mm/px · z∈[+76,+198]mm · 11 of 45 slices shown]
[im 1/45]
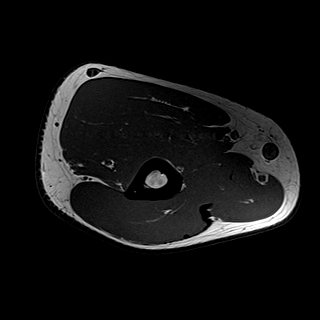
[im 5/45]
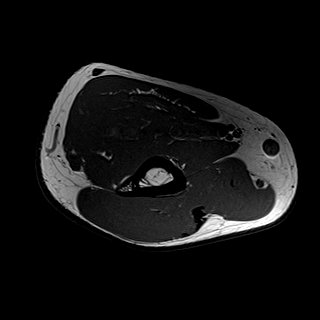
[im 9/45]
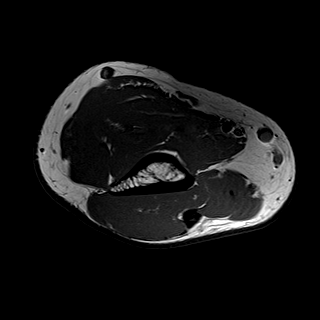
[im 14/45]
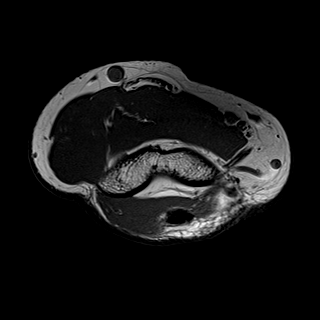
[im 18/45]
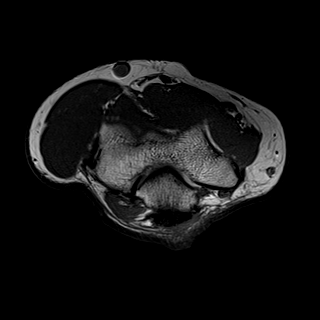
[im 23/45]
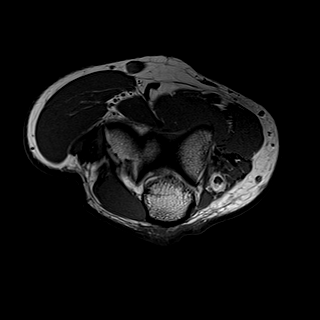
[im 27/45]
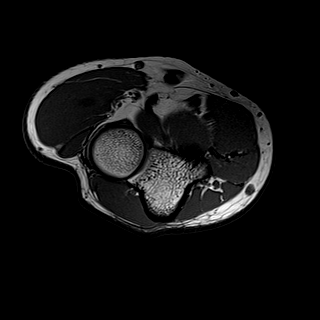
[im 31/45]
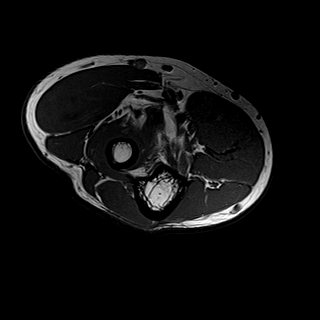
[im 36/45]
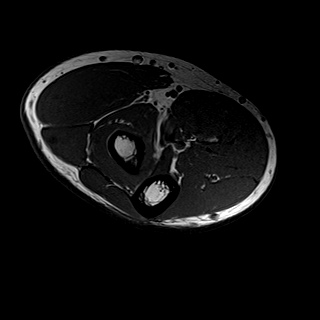
[im 40/45]
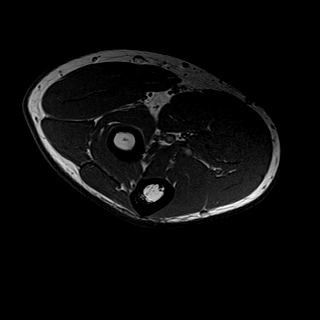
[im 45/45]
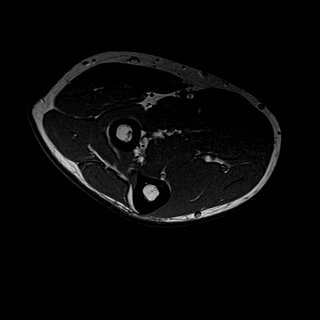

[Series 6: T2 fat-sat · axial · left · 3.0mm · 0.44mm/px · z∈[+76,+198]mm · 11 of 45 slices shown (1 of 2)]
[im 1/45]
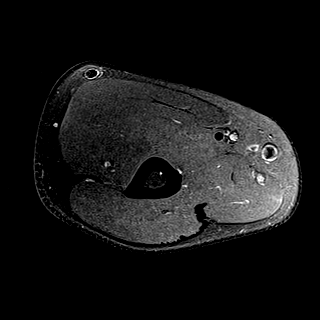
[im 5/45]
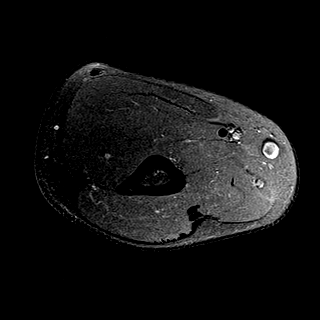
[im 9/45]
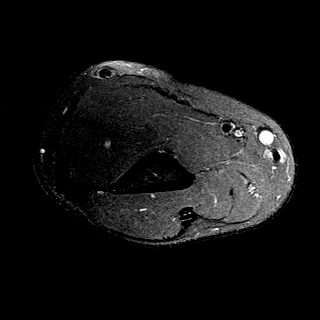
[im 14/45]
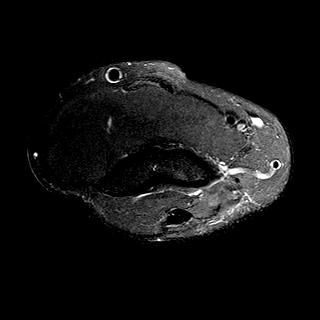
[im 18/45]
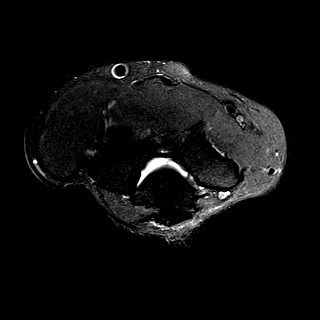
[im 23/45]
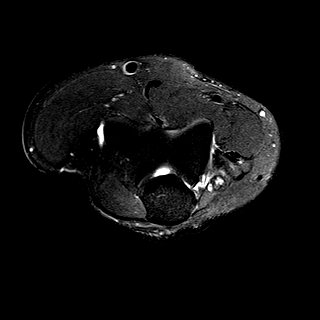
[im 27/45]
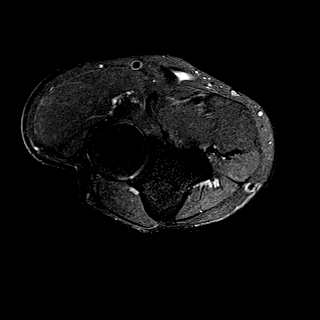
[im 31/45]
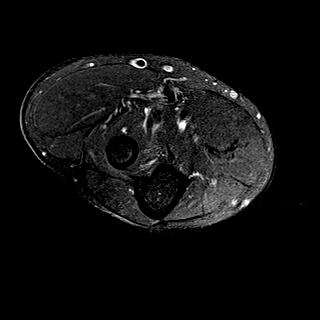
[im 36/45]
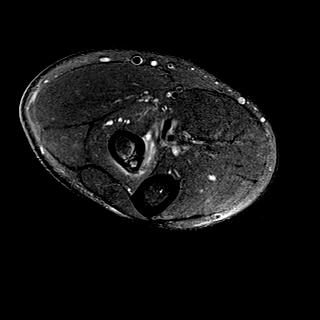
[im 40/45]
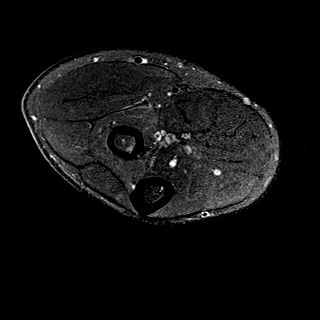
[im 45/45]
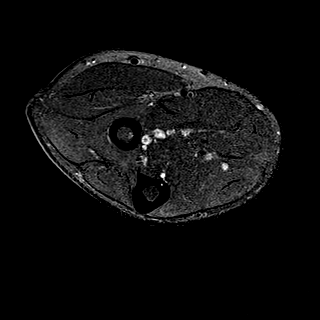

[Series 8: PD fat-sat · sagittal · left · 3.0mm · 0.62mm/px · 10 of 39 slices shown]
[im 1/39]
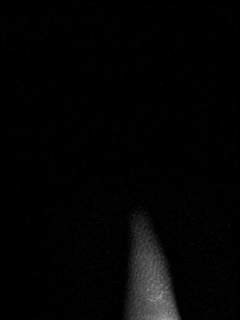
[im 5/39]
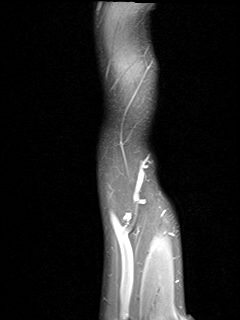
[im 9/39]
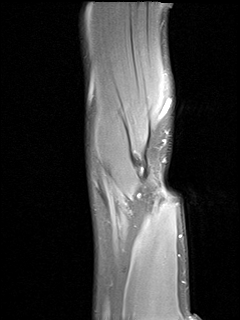
[im 13/39]
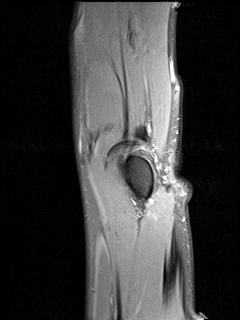
[im 17/39]
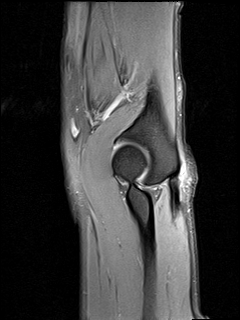
[im 22/39]
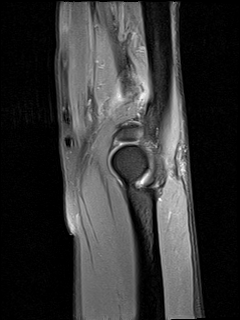
[im 26/39]
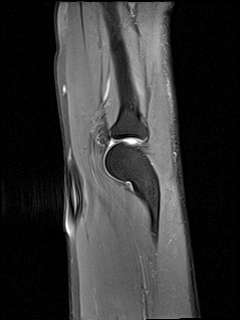
[im 30/39]
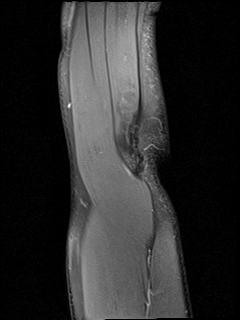
[im 34/39]
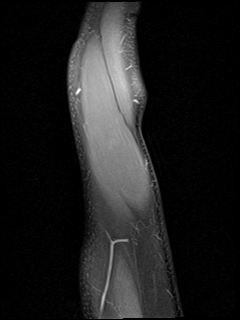
[im 39/39]
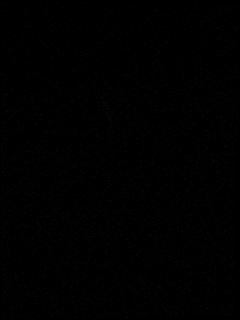

[Series 9: T2 fat-sat · coronal · left · 3.0mm · 0.62mm/px · 8 of 31 slices shown (2 of 2)]
[im 1/31]
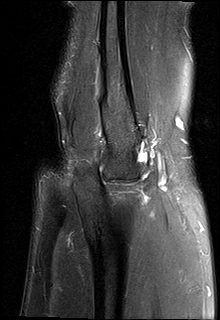
[im 5/31]
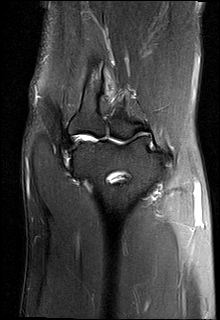
[im 9/31]
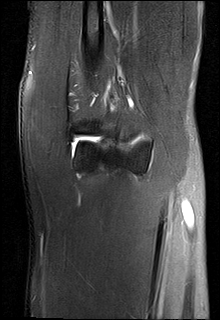
[im 13/31]
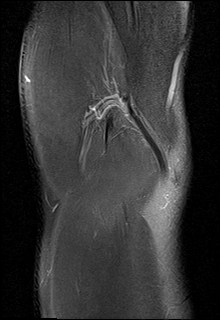
[im 18/31]
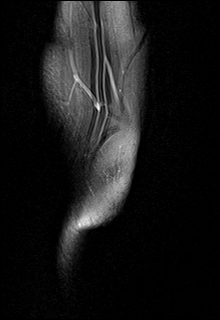
[im 22/31]
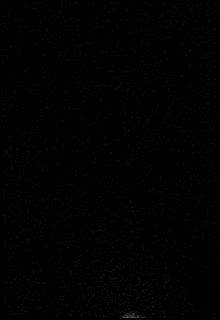
[im 26/31]
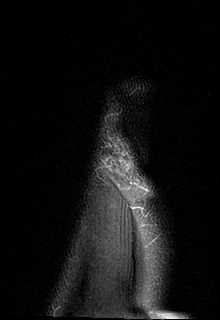
[im 31/31]
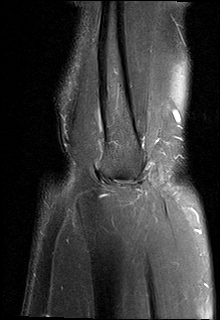

[40 of 40 positions shown; findings below may reference images not displayed]

FINDINGS: TENDONS

Common forearm flexor origin: Small partial-thickness tear of common
flexor tendon origin.

Common forearm extensor origin: Moderate tendinosis of common
extensor tendon origin with a partial-thickness tear.

Biceps: Complete tear of biceps tendon within 1.5 cm of retraction.
A small portion of the lacertus fibrosis appears to be intact.

Triceps: Intact.

LIGAMENTS

Medial stabilizers: Intact.

Lateral stabilizers:  Intact.

Cartilage: No chondral defect.

Joint: No joint effusion.  No intra-articular loose body.

Cubital tunnel: Normal.

Bones: No acute osseous abnormality.  No aggressive osseous lesion.

Soft tissue: Muscles are normal.
IMPRESSION: 1. Complete tear of biceps tendon within 1.5 cm of retraction. A
small portion of the lacertus fibrosis appears to be intact.
2. Small partial-thickness tear of common flexor tendon origin.
3. Moderate tendinosis of common extensor tendon origin with a
partial-thickness tear.

## 2017-11-13 NOTE — Telephone Encounter (Signed)
Patient needs to discuss MRI details and needing a different order per ortho specialists

## 2017-11-13 NOTE — Telephone Encounter (Signed)
Spoke to patient and he states that [facility] and their office has gotten the PA and everything is good to go with the MRI. MPulliam, CMA/RT(R)

## 2017-11-20 ENCOUNTER — Telehealth: Payer: Self-pay | Admitting: Family Medicine

## 2017-11-20 NOTE — Telephone Encounter (Signed)
Called patient and notified him that paperwork is in Dr. Sharee Holsterpalski box for her to do after patient's today and that I will call him when it is faxed.  Patient also wanted me to inform Dr. Sharee Holsterpalski that his weight today at the ortho office was 246lbs. MPulliam, CMA/RT(R)

## 2017-11-20 NOTE — Telephone Encounter (Signed)
Patient called wanted to know if medical clearance lettter from [facility] rcvd  [facility], advised that it had but provider very busy today w/ clinic ---forwarding message  Patient request call back with status if possible surgery is dependent on provider clearance

## 2017-11-22 ENCOUNTER — Other Ambulatory Visit: Payer: Self-pay | Admitting: Family Medicine

## 2017-11-27 ENCOUNTER — Ambulatory Visit (INDEPENDENT_AMBULATORY_CARE_PROVIDER_SITE_OTHER): Payer: 59 | Admitting: Family Medicine

## 2017-11-27 ENCOUNTER — Encounter: Payer: Self-pay | Admitting: Family Medicine

## 2017-11-27 VITALS — BP 119/71 | HR 75 | Wt 241.0 lb

## 2017-11-27 DIAGNOSIS — I152 Hypertension secondary to endocrine disorders: Secondary | ICD-10-CM

## 2017-11-27 DIAGNOSIS — E119 Type 2 diabetes mellitus without complications: Secondary | ICD-10-CM | POA: Insufficient documentation

## 2017-11-27 DIAGNOSIS — E669 Obesity, unspecified: Secondary | ICD-10-CM | POA: Diagnosis not present

## 2017-11-27 DIAGNOSIS — E1159 Type 2 diabetes mellitus with other circulatory complications: Secondary | ICD-10-CM | POA: Diagnosis not present

## 2017-11-27 DIAGNOSIS — J324 Chronic pansinusitis: Secondary | ICD-10-CM | POA: Insufficient documentation

## 2017-11-27 DIAGNOSIS — Z419 Encounter for procedure for purposes other than remedying health state, unspecified: Secondary | ICD-10-CM

## 2017-11-27 DIAGNOSIS — Z23 Encounter for immunization: Secondary | ICD-10-CM | POA: Diagnosis not present

## 2017-11-27 DIAGNOSIS — E1169 Type 2 diabetes mellitus with other specified complication: Secondary | ICD-10-CM

## 2017-11-27 DIAGNOSIS — I10 Essential (primary) hypertension: Secondary | ICD-10-CM

## 2017-11-27 MED ORDER — FEXOFENADINE HCL 180 MG PO TABS: 180.0000 mg | ORAL_TABLET | Freq: Every day | ORAL | 3 refills | Status: DC

## 2017-11-27 MED ORDER — METFORMIN HCL 500 MG PO TABS: 250.0000 mg | ORAL_TABLET | Freq: Two times a day (BID) | ORAL | 1 refills | Status: DC

## 2017-11-27 MED ORDER — FLUTICASONE PROPIONATE 50 MCG/ACT NA SUSP: NASAL | 6 refills | Status: DC

## 2017-11-27 NOTE — Progress Notes (Signed)
Impression and Recommendations:    1. Need for Tdap vaccination   2. Surgery, elective- having in near future- here for clearance   3. Hypertension associated with diabetes (HCC)   4. Type 2 diabetes mellitus with other specified complication, without long-term current use of insulin (HCC)   5. Personal history of TIA (transient ischemic attack)   6. Obesity, unspecified classification, unspecified obesity type, unspecified whether serious comorbidity present   7. Chronic pansinusitis     1. Need for tdap vaccination -given today  2. Surgery elective- here for clearance- -Pt's last A1c on 11-06-17 was 8.2. During the last few weeks, he has been working diligently on diet and exercise and his blood sugars have been extremely well-controlled in the last 3 weeks. He is down 14 lbs. His fasting blood sugars have all been in the 100-110s.  -As long as sugar remains under great control, he is cleared for surgery. -EKG done in office today with results WNL.  3. DM2-  -dose change, decrease metformin to 250mg  BID. Pt has only been taking 500mg  QHS only, not as written.  -During the last few weeks, he has been working diligently on diet and exercise and his blood sugars have been extremely well-controlled in the last 3 weeks. He is down 14 lbs. His fasting blood sugars have all been in the 100-110s.   4. Hypertension- BP well-controlled in office today. Pt reports after starting BP meds he had SE of HA, but this has since resolved. Pt is otherwise asymptomatic. Continue checking BP at home and bring a log into next OV. Continue meds as listed below.   5. Obesity-  -continue diet and exercise. Pt is down 14 lbs since last OV. Recommend losing weight.  6. Chronic pansinusitis- -Use neti pot twice daily followed by 1 spray of flonase in each nostril. Take claritin, allegra, zyzol etc. every day. Recommend using M95 mask when out in the yard doing work to avoid pollen. -push fluids, get  plenty of rest.   Orders Placed This Encounter  Procedures  . Tdap vaccine greater than or equal to 7yo IM  . EKG 12-Lead    Meds ordered this encounter  Medications  . metFORMIN (GLUCOPHAGE) 500 MG tablet    Sig: Take 0.5 tablets (250 mg total) by mouth 2 (two) times daily with a meal.    Dispense:  90 tablet    Refill:  1  . fluticasone (FLONASE) 50 MCG/ACT nasal spray    Sig: 1 spray each nostril following sinus rinse twice daily    Dispense:  16 g    Refill:  6    otc  . fexofenadine (ALLEGRA) 180 MG tablet    Sig: Take 1 tablet (180 mg total) by mouth daily.    Dispense:  90 tablet    Refill:  3    Gross side effects, risk and benefits, and alternatives of medications and treatment plan in general discussed with patient.  Patient is aware that all medications have potential side effects and we are unable to predict every side effect or drug-drug interaction that may occur.   Patient will call with any questions prior to using medication if they have concerns.  Expresses verbal understanding and consents to current therapy and treatment regimen.  No barriers to understanding were identified.  Red flag symptoms and signs discussed in detail.  Patient expressed understanding regarding what to do in case of emergency\urgent symptoms  Please see AVS handed out  to patient at the end of our visit for further patient instructions/ counseling done pertaining to today's office visit.   Return in about 3 months (around 02/27/2018) for sooner if concerns.    Note: This note was prepared with assistance of Dragon voice recognition software. Occasional wrong-word or sound-a-like substitutions may have occurred due to the inherent limitations of voice recognition software.  This document serves as a record of services personally performed by Thomasene Lot, DO. It was created on her behalf by Thelma Barge, a trained medical scribe. The creation of this record is based on the scribe's  personal observations and the provider's statements to them.   I have reviewed the above medical documentation for accuracy and completeness and I concur.  Thomasene Lot 11/27/17 5:03 PM  --------------------------------------------------------------------------------------------------------------------------------------------------------------------------------------------------------------------------------------------  Subjective:     HPI: Nathan Kelley is a 57 y.o. male who presents to [facility] today for issues as discussed below.  He is here for elective surgical clearance for biceps repair with Delbert Harness in the next month. He denies exertional CP/SOB, palpitation, dizziness. He has been working diligently on diet and exercise after being diagnosed with DM2 at last OV 11-06-17.   HTN HPI:  -  His blood pressure has been controlled at home.  Pt is checking it at home.   - Patient reports good compliance with blood pressure medications  -After starting his BP medications, he reports having a HA at first, but this has been improving and almost resolved.   - Smoking Status noted   - He denies new onset of: chest pain, exercise intolerance, shortness of breath, dizziness, visual changes, headache, lower extremity swelling or claudication.   Last 3 blood pressure readings in our office are as follows: BP Readings from Last 3 Encounters:  11/06/17 126/75  06/28/17 125/78  02/16/17 138/75    There were no vitals filed for this visit.   Sinus Pt states he has had sinus congestion for the past few weeks with constant "rattling" cough that has improved. He had subjective fever. He denies one-sided face pain and fevers in office today.   He has tried OTC tussin with mild relief.   DM HPI: A1c from 11-06-17 was 8.2.  -  He has been working on diet and exercise for diabetes. He has been eating a lot of eggs, fish, meats, and vegetables  cooked in butter. He has stopped drinking alcohol and sodas, as well as stopped eating breads. He states his wife has made a keto bread made out of egg.   He has lost 14 pounds since last OV after doing the keto diet for 3 weeks.   Pt is currently maintained on the following medications for diabetes:   see med list today  Medication compliance - he has only been taking 500mg  metformin qd.   Home glucose readings range: His fasting blood sugars have been between 100-110s in the last several weeks.  Blood sugars have been very controlled recently.    Denies polyuria/polydipsia.  Denies hypo/ hyperglycemia symptoms  Last diabetic eye exam was No results found for: HMDIABEYEEXA  Foot exam- UTD  Last A1C in the office was:  Lab Results  Component Value Date   HGBA1C 8.2 11/06/2017   HGBA1C 6.2 (H) 02/27/2017   HGBA1C 5.8 10/12/2015    Lab Results  Component Value Date   LDLCALC 51 02/27/2017   CREATININE 0.91 02/27/2017    Wt Readings from Last 3 Encounters:  11/06/17 255 lb 4.8 oz (115.8 kg)  06/28/17 247 lb 14.4 oz (112.4 kg)  02/16/17 245 lb 6.4 oz (111.3 kg)    BP Readings from Last 3 Encounters:  11/06/17 126/75  06/28/17 125/78  02/16/17 138/75     Wt Readings from Last 3 Encounters:  11/06/17 255 lb 4.8 oz (115.8 kg)  06/28/17 247 lb 14.4 oz (112.4 kg)  02/16/17 245 lb 6.4 oz (111.3 kg)   BP Readings from Last 3 Encounters:  11/06/17 126/75  06/28/17 125/78  02/16/17 138/75   Pulse Readings from Last 3 Encounters:  11/06/17 65  06/28/17 64  02/16/17 75   BMI Readings from Last 3 Encounters:  11/06/17 34.62 kg/m  06/28/17 33.62 kg/m  02/16/17 33.28 kg/m     Patient Care Team    Relationship Specialty Notifications Start End  Thomasene Lot, DO PCP - General Family Medicine  02/16/17      Patient Active Problem List   Diagnosis Date Noted  . Obesity (BMI 30-39.9) 11/06/2017    Priority: High  . Hypertension associated with diabetes  (HCC) 11/26/2009    Priority: High  . Personal history of TIA (transient ischemic attack) 12/23/2008    Priority: High  . Chronic pansinusitis 11/27/2017  . Pain and swelling of left elbow 06/28/2017  . Plantar fasciitis 02/16/2017  . Family history of diabetes mellitus- Mom and sister 02/16/2017  . Acute reaction to situational stress 02/16/2017  . Neuropathic pain of right foot 02/16/2017  . Glucose intolerance (impaired glucose tolerance) 02/16/2017  . Obesity 10/13/2015  . Numbness of foot 10/13/2015  . Sleep disorder 06/25/2014  . ANXIETY 07/13/2009  . GERD 10/10/2007  . Other dysphagia 10/10/2007    Past Medical history, Surgical history, Family history, Social history, Allergies and Medications have been entered into the medical record, reviewed and changed as needed.    No outpatient medications have been marked as taking for the 11/27/17 encounter (Office Visit) with Thomasene Lot, DO.    Allergies:  No Known Allergies   Review of Systems:  A fourteen system review of systems was performed and found to be positive as per HPI.   Objective:   There were no vitals taken for this visit. There is no height or weight on file to calculate BMI. General:  Well Developed, well nourished, appropriate for stated age.  Neuro:  Alert and oriented,  extra-ocular muscles intact  HEENT:  Normocephalic, atraumatic, neck supple, no carotid bruits appreciated  Skin:  no gross rash, warm, pink. Cardiac:  RRR, S1 S2 Respiratory:  ECTA B/L and A/P, Not using accessory muscles, speaking in full sentences- unlabored. Vascular:  Ext warm, no cyanosis apprec.; cap RF less 2 sec. Psych:  No HI/SI, judgement and insight good, Euthymic mood. Full Affect.

## 2017-11-27 NOTE — Patient Instructions (Addendum)
-  Use neti pot or AYR sinus rinses twice daily followed by 1 spray of flonase in each nostril, twice daily. Take claritin, allegra, zyzol etc. every day.   Keep up the great work with your diet and exercise.

## 2017-11-27 NOTE — Progress Notes (Signed)
ekg 

## 2017-12-12 ENCOUNTER — Telehealth: Payer: Self-pay | Admitting: Family Medicine

## 2017-12-12 NOTE — Telephone Encounter (Signed)
Patient called to pass on a good update. He is currently down to 226.8 lbs (highest was 255 lbs when he began here) also his sugar is down to 103. He just wanted to keep Dr. Val Eagle updated with his progress

## 2017-12-14 NOTE — Telephone Encounter (Signed)
Noted and Dr. Sharee Holsterpalski notified. MPulliam, CMA/RT(R)

## 2017-12-26 ENCOUNTER — Other Ambulatory Visit: Payer: Self-pay

## 2017-12-26 DIAGNOSIS — I10 Essential (primary) hypertension: Secondary | ICD-10-CM

## 2017-12-26 MED ORDER — METOPROLOL TARTRATE 25 MG PO TABS: 25.0000 mg | ORAL_TABLET | Freq: Two times a day (BID) | ORAL | 1 refills | Status: DC

## 2017-12-26 NOTE — Telephone Encounter (Signed)
Pharmacy sent refill request for metoprolol, reviewed chart and sent in medication. MPulliam, CMA/RT(R)

## 2018-01-02 LAB — HM DIABETES EYE EXAM

## 2018-01-20 ENCOUNTER — Other Ambulatory Visit: Payer: Self-pay | Admitting: Family Medicine

## 2018-01-20 DIAGNOSIS — I10 Essential (primary) hypertension: Principal | ICD-10-CM

## 2018-01-20 DIAGNOSIS — I152 Hypertension secondary to endocrine disorders: Secondary | ICD-10-CM

## 2018-01-20 DIAGNOSIS — E1159 Type 2 diabetes mellitus with other circulatory complications: Secondary | ICD-10-CM

## 2018-02-05 ENCOUNTER — Encounter: Payer: Self-pay | Admitting: Family Medicine

## 2018-02-05 ENCOUNTER — Ambulatory Visit (INDEPENDENT_AMBULATORY_CARE_PROVIDER_SITE_OTHER): Payer: 59 | Admitting: Family Medicine

## 2018-02-05 VITALS — BP 112/70 | HR 71 | Ht 72.0 in | Wt 223.0 lb

## 2018-02-05 DIAGNOSIS — E1159 Type 2 diabetes mellitus with other circulatory complications: Secondary | ICD-10-CM | POA: Diagnosis not present

## 2018-02-05 DIAGNOSIS — E1169 Type 2 diabetes mellitus with other specified complication: Secondary | ICD-10-CM

## 2018-02-05 DIAGNOSIS — E669 Obesity, unspecified: Secondary | ICD-10-CM

## 2018-02-05 DIAGNOSIS — I152 Hypertension secondary to endocrine disorders: Secondary | ICD-10-CM

## 2018-02-05 DIAGNOSIS — I10 Essential (primary) hypertension: Secondary | ICD-10-CM | POA: Diagnosis not present

## 2018-02-05 DIAGNOSIS — Z8673 Personal history of transient ischemic attack (TIA), and cerebral infarction without residual deficits: Secondary | ICD-10-CM | POA: Diagnosis not present

## 2018-02-05 LAB — POCT GLYCOSYLATED HEMOGLOBIN (HGB A1C): Hemoglobin A1C: 5.4 % (ref 4.0–5.6)

## 2018-02-05 MED ORDER — LOSARTAN POTASSIUM 100 MG PO TABS: 50.0000 mg | ORAL_TABLET | Freq: Every day | ORAL | 0 refills | Status: DC
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
## 2018-02-05 NOTE — Patient Instructions (Signed)
  Keep up the fantastic work you are doing great!!!

## 2018-02-20 ENCOUNTER — Other Ambulatory Visit: Payer: Self-pay | Admitting: Family Medicine

## 2018-02-27 ENCOUNTER — Ambulatory Visit: Payer: 59 | Admitting: Family Medicine

## 2018-05-23 ENCOUNTER — Other Ambulatory Visit: Payer: Self-pay | Admitting: Family Medicine

## 2018-05-25 ENCOUNTER — Other Ambulatory Visit: Payer: Self-pay | Admitting: Family Medicine

## 2018-05-25 DIAGNOSIS — I10 Essential (primary) hypertension: Principal | ICD-10-CM

## 2018-05-25 DIAGNOSIS — I152 Hypertension secondary to endocrine disorders: Secondary | ICD-10-CM

## 2018-05-25 DIAGNOSIS — E1159 Type 2 diabetes mellitus with other circulatory complications: Secondary | ICD-10-CM

## 2018-06-04 ENCOUNTER — Other Ambulatory Visit: Payer: 59

## 2018-06-04 DIAGNOSIS — I152 Hypertension secondary to endocrine disorders: Secondary | ICD-10-CM

## 2018-06-04 DIAGNOSIS — E1159 Type 2 diabetes mellitus with other circulatory complications: Secondary | ICD-10-CM

## 2018-06-04 DIAGNOSIS — E119 Type 2 diabetes mellitus without complications: Secondary | ICD-10-CM

## 2018-06-04 DIAGNOSIS — Z0000 Encounter for general adult medical examination without abnormal findings: Secondary | ICD-10-CM

## 2018-06-04 DIAGNOSIS — I10 Essential (primary) hypertension: Principal | ICD-10-CM

## 2018-06-05 ENCOUNTER — Ambulatory Visit: Payer: 59 | Admitting: Family Medicine

## 2018-06-05 LAB — CBC WITH DIFFERENTIAL/PLATELET
BASOS ABS: 0 10*3/uL (ref 0.0–0.2)
Basos: 0 %
EOS (ABSOLUTE): 0.1 10*3/uL (ref 0.0–0.4)
Eos: 2 %
HEMATOCRIT: 45.3 % (ref 37.5–51.0)
HEMOGLOBIN: 15.7 g/dL (ref 13.0–17.7)
Immature Grans (Abs): 0 10*3/uL (ref 0.0–0.1)
Immature Granulocytes: 1 %
LYMPHS ABS: 1.8 10*3/uL (ref 0.7–3.1)
Lymphs: 28 %
MCH: 31 pg (ref 26.6–33.0)
MCHC: 34.7 g/dL (ref 31.5–35.7)
MCV: 90 fL (ref 79–97)
MONOCYTES: 8 %
MONOS ABS: 0.5 10*3/uL (ref 0.1–0.9)
Neutrophils Absolute: 4.1 10*3/uL (ref 1.4–7.0)
Neutrophils: 61 %
Platelets: 240 10*3/uL (ref 150–450)
RBC: 5.06 x10E6/uL (ref 4.14–5.80)
RDW: 14.2 % (ref 12.3–15.4)
WBC: 6.6 10*3/uL (ref 3.4–10.8)

## 2018-06-05 LAB — LIPID PANEL
CHOL/HDL RATIO: 2.1 ratio (ref 0.0–5.0)
Cholesterol, Total: 137 mg/dL (ref 100–199)
HDL: 65 mg/dL (ref >39)
LDL CALC: 50 mg/dL (ref 0–99)
Triglycerides: 109 mg/dL (ref 0–149)
VLDL CHOLESTEROL CAL: 22 mg/dL (ref 5–40)

## 2018-06-05 LAB — TSH: TSH: 4.11 u[IU]/mL (ref 0.450–4.500)

## 2018-06-05 LAB — COMPREHENSIVE METABOLIC PANEL
ALBUMIN: 4.4 g/dL (ref 3.5–5.5)
ALK PHOS: 54 IU/L (ref 39–117)
ALT: 28 IU/L (ref 0–44)
AST: 22 IU/L (ref 0–40)
Albumin/Globulin Ratio: 1.8 (ref 1.2–2.2)
BILIRUBIN TOTAL: 0.6 mg/dL (ref 0.0–1.2)
BUN / CREAT RATIO: 18 (ref 9–20)
BUN: 18 mg/dL (ref 6–24)
CO2: 27 mmol/L (ref 20–29)
Calcium: 9.4 mg/dL (ref 8.7–10.2)
Chloride: 100 mmol/L (ref 96–106)
Creatinine, Ser: 1.01 mg/dL (ref 0.76–1.27)
GFR calc Af Amer: 94 mL/min/{1.73_m2} (ref >59)
GFR calc non Af Amer: 82 mL/min/{1.73_m2} (ref >59)
GLOBULIN, TOTAL: 2.4 g/dL (ref 1.5–4.5)
Glucose: 97 mg/dL (ref 65–99)
POTASSIUM: 4.3 mmol/L (ref 3.5–5.2)
SODIUM: 141 mmol/L (ref 134–144)
Total Protein: 6.8 g/dL (ref 6.0–8.5)

## 2018-06-05 LAB — HEMOGLOBIN A1C
Est. average glucose Bld gHb Est-mCnc: 111 mg/dL
HEMOGLOBIN A1C: 5.5 % (ref 4.8–5.6)

## 2018-06-05 LAB — T4, FREE: Free T4: 1.12 ng/dL (ref 0.82–1.77)

## 2018-06-05 LAB — VITAMIN D 25 HYDROXY (VIT D DEFICIENCY, FRACTURES): Vit D, 25-Hydroxy: 42.5 ng/mL (ref 30.0–100.0)

## 2018-06-11 ENCOUNTER — Ambulatory Visit: Payer: 59 | Admitting: Family Medicine

## 2018-06-18 ENCOUNTER — Other Ambulatory Visit: Payer: Self-pay | Admitting: Family Medicine

## 2018-06-18 DIAGNOSIS — E1159 Type 2 diabetes mellitus with other circulatory complications: Secondary | ICD-10-CM

## 2018-06-18 DIAGNOSIS — I152 Hypertension secondary to endocrine disorders: Secondary | ICD-10-CM

## 2018-06-18 DIAGNOSIS — I10 Essential (primary) hypertension: Principal | ICD-10-CM

## 2018-07-03 ENCOUNTER — Encounter: Payer: Self-pay | Admitting: Family Medicine

## 2018-07-03 ENCOUNTER — Ambulatory Visit (INDEPENDENT_AMBULATORY_CARE_PROVIDER_SITE_OTHER): Payer: 59 | Admitting: Family Medicine

## 2018-07-03 VITALS — BP 129/79 | HR 58 | Temp 97.9°F | Resp 16 | Wt 219.0 lb

## 2018-07-03 DIAGNOSIS — E1159 Type 2 diabetes mellitus with other circulatory complications: Secondary | ICD-10-CM

## 2018-07-03 DIAGNOSIS — Z23 Encounter for immunization: Secondary | ICD-10-CM | POA: Diagnosis not present

## 2018-07-03 DIAGNOSIS — Z8673 Personal history of transient ischemic attack (TIA), and cerebral infarction without residual deficits: Secondary | ICD-10-CM | POA: Diagnosis not present

## 2018-07-03 DIAGNOSIS — I152 Hypertension secondary to endocrine disorders: Secondary | ICD-10-CM

## 2018-07-03 DIAGNOSIS — F430 Acute stress reaction: Secondary | ICD-10-CM

## 2018-07-03 DIAGNOSIS — E663 Overweight: Secondary | ICD-10-CM

## 2018-07-03 DIAGNOSIS — E1169 Type 2 diabetes mellitus with other specified complication: Secondary | ICD-10-CM

## 2018-07-03 DIAGNOSIS — I10 Essential (primary) hypertension: Secondary | ICD-10-CM

## 2018-07-03 MED ORDER — FLUOXETINE HCL 20 MG PO CAPS: 40.0000 mg | ORAL_CAPSULE | Freq: Every day | ORAL | 3 refills | Status: DC
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
## 2018-07-03 NOTE — Patient Instructions (Signed)

## 2018-08-17 ENCOUNTER — Other Ambulatory Visit: Payer: Self-pay | Admitting: Family Medicine

## 2018-08-17 DIAGNOSIS — F430 Acute stress reaction: Secondary | ICD-10-CM

## 2018-09-27 ENCOUNTER — Other Ambulatory Visit: Payer: Self-pay | Admitting: Family Medicine

## 2018-09-27 DIAGNOSIS — F430 Acute stress reaction: Secondary | ICD-10-CM

## 2018-10-03 ENCOUNTER — Other Ambulatory Visit: Payer: Self-pay | Admitting: Gastroenterology

## 2018-10-03 ENCOUNTER — Other Ambulatory Visit
Admission: RE | Admit: 2018-10-03 | Discharge: 2018-10-03 | Disposition: A | Discharge status: Home / Self Care | Payer: Commercial Managed Care - POS | Source: Ambulatory Visit | Attending: Gastroenterology | Admitting: Gastroenterology

## 2018-10-03 ENCOUNTER — Ambulatory Visit (INDEPENDENT_AMBULATORY_CARE_PROVIDER_SITE_OTHER): Payer: Commercial Managed Care - POS | Attending: Family Medicine

## 2018-10-03 DIAGNOSIS — Z86010 Personal history of colonic polyps: Secondary | ICD-10-CM

## 2018-10-03 DIAGNOSIS — D126 Benign neoplasm of colon, unspecified: Secondary | ICD-10-CM | POA: Insufficient documentation

## 2018-12-03 ENCOUNTER — Encounter: Payer: Self-pay | Admitting: Family Medicine

## 2018-12-03 ENCOUNTER — Other Ambulatory Visit: Payer: Self-pay

## 2018-12-03 ENCOUNTER — Ambulatory Visit (INDEPENDENT_AMBULATORY_CARE_PROVIDER_SITE_OTHER): Payer: Managed Care, Other (non HMO) | Admitting: Family Medicine

## 2018-12-03 VITALS — BP 122/72 | HR 63 | Ht 72.0 in | Wt 226.0 lb

## 2018-12-03 DIAGNOSIS — E669 Obesity, unspecified: Secondary | ICD-10-CM

## 2018-12-03 DIAGNOSIS — E1159 Type 2 diabetes mellitus with other circulatory complications: Secondary | ICD-10-CM

## 2018-12-03 DIAGNOSIS — I10 Essential (primary) hypertension: Secondary | ICD-10-CM

## 2018-12-03 DIAGNOSIS — E1169 Type 2 diabetes mellitus with other specified complication: Secondary | ICD-10-CM

## 2018-12-03 DIAGNOSIS — F430 Acute stress reaction: Secondary | ICD-10-CM | POA: Diagnosis not present

## 2018-12-03 DIAGNOSIS — I152 Hypertension secondary to endocrine disorders: Secondary | ICD-10-CM

## 2018-12-03 NOTE — Progress Notes (Signed)
Virtual Visit via Telephone Note for [employer], D.O- Primary Care Physician at [facility]   I connected with current patient today by telephone and verified that I am speaking with the correct person using two identifiers.   I discussed the limitations, risks, security and privacy concerns of performing an evaluation and management service by telephone and the limited availability of in person appointments during this current national crisis of the Covid-19 pandemic.  My staff members also discussed with the patient that there may be a patient responsible charge related to this service.  The patient expressed understanding and agreed to proceed.     History of Present Illness:   DM:   Lifestyle mod is txmnt.   FBS- 110-115.      DIET/ Wt--> Obese:   Gained 10lbs since last time seen.  Not as strict on Keto diet.  Not really doing it.    - still tracking using Lose IT.   Bp- well controlled.  Taking meds as prescribed.   Exercising- gym closed recently but bought total gym.  - Golfing twice [employer].     Wt Readings from Last 3 Encounters:  12/03/18 226 lb (102.5 kg)  07/03/18 219 lb (99.3 kg)  02/05/18 223 lb (101.2 kg)   BP Readings from Last 3 Encounters:  12/03/18 122/72  07/03/18 129/79  02/05/18 112/70   Pulse Readings from Last 3 Encounters:  12/03/18 63  07/03/18 (!) 58  02/05/18 71   BMI Readings from Last 3 Encounters:  12/03/18 30.65 kg/m  07/03/18 29.70 kg/m  02/05/18 30.24 kg/m     Patient Care Team    Relationship Specialty Notifications Start End  Thomasene Lot, DO PCP - General Family Medicine  02/16/17      Patient Active Problem List   Diagnosis Date Noted  . Diabetes mellitus ([facility])- diet controlled 11/27/2017    Priority: High  . Overweight (BMI 25.0-29.9) 11/06/2017    Priority: High  . Hypertension associated with diabetes ([facility]) 11/26/2009    Priority: High  . Personal history of TIA (transient ischemic attack)  12/23/2008    Priority: High  . Obesity (BMI 30-39.9) 12/03/2018  . Chronic pansinusitis 11/27/2017  . Pain and swelling of left elbow 06/28/2017  . Plantar fasciitis 02/16/2017  . Family history of diabetes mellitus- Mom and sister 02/16/2017  . Acute reaction to situational stress 02/16/2017  . Neuropathic pain of right foot 02/16/2017  . Obesity 10/13/2015  . Numbness of foot 10/13/2015  . Sleep disorder 06/25/2014  . ANXIETY 07/13/2009  . GERD 10/10/2007  . Other dysphagia 10/10/2007     Current Meds  Medication Sig  . AMBULATORY NON FORMULARY MEDICATION Single glucometer with lancets, test strips  . aspirin 325 MG tablet Take 325 mg by mouth daily.    Marland Kitchen FLUoxetine (PROZAC) 20 MG capsule TAKE 1 CAPSULE BY MOUTH EVERY DAY  . losartan (COZAAR) 100 MG tablet TAKE 1 TABLET BY MOUTH EVERY DAY (Patient taking differently: Take 50 mg by mouth daily. )     Allergies:  No Known Allergies   ROS:  See above HPI for pertinent positives and negatives   Objective:   Blood pressure 122/72, pulse 63, height 6' (1.829 m), weight 226 lb (102.5 kg). Body mass index is 30.65 kg/m.   General: sounds in no acute distress.  Skin: Pt confirms warm and dry  extremities and pink fingertips Respiratory: speaking in full sentences, no conversational dyspnea Psych: A and O *3,  appears insight good, mood- full      Impression and Recommendations:     1. Type 2 diabetes mellitus with other specified complication, without long-term current use of insulin ([facility]) Controlled at home, no need for office visit w labs since home numbers so good at this time - if consistently get over 120 FBS, come in for A1c reck -diet/ exercise d/c pt - keep prudent wt  2. Hypertension associated with diabetes ([facility]) Well controlled -cont meds  3. Acute reaction to situational stress Stable on proxac 20mg  qd   4. Obesity (BMI 30-39.9) Reminded pt as Inc BMI= inc BS, BP etc.   - diet and exercise d/c  pt- cheating a little bit with candy etc.  We d/c pt alternative ways to "satisfy sweet tooth"  I discussed the assessment and treatment plan with the patient. The patient was provided an opportunity to ask questions and all were answered. The patient agreed with the plan and demonstrated an understanding of the instructions.   The patient was advised to call back or seek an in-person evaluation if the symptoms worsen or if the condition fails to improve as anticipated.  Return for 62mo- BP, BS, mood etc.   Gross side effects, risk and benefits, and alternatives of medications and treatment plan in general discussed with patient.  Patient is aware that all medications have potential side effects and we are unable to predict every side effect or drug-drug interaction that may occur.   Patient was strongly encouraged to call with any questions or concerns they may have concerns.    Expresses verbal understanding and consents to current therapy and treatment regimen.  No barriers to understanding were identified.  Red flag symptoms and signs discussed in detail.  Patient expressed understanding regarding what to do in case of emergency\urgent symptoms  Please see AVS handed out to patient at the end of our visit for further patient instructions/ counseling done pertaining to today's office visit.  I provided 18 minutes 3 seconds of non-face-to-face time during this encounter.     Thomasene Lot, DO

## 2019-01-06 ENCOUNTER — Other Ambulatory Visit: Payer: Self-pay | Admitting: Family Medicine

## 2019-01-06 DIAGNOSIS — I10 Essential (primary) hypertension: Principal | ICD-10-CM

## 2019-01-06 DIAGNOSIS — E1159 Type 2 diabetes mellitus with other circulatory complications: Secondary | ICD-10-CM

## 2019-01-06 DIAGNOSIS — I152 Hypertension secondary to endocrine disorders: Secondary | ICD-10-CM

## 2019-02-13 ENCOUNTER — Emergency Department (HOSPITAL_COMMUNITY): Payer: Managed Care, Other (non HMO)

## 2019-02-13 ENCOUNTER — Emergency Department (HOSPITAL_COMMUNITY)
Admission: EM | Admit: 2019-02-13 | Discharge: 2019-02-13 | Disposition: A | Discharge status: Home / Self Care | Payer: Managed Care, Other (non HMO) | Attending: Emergency Medicine | Admitting: Emergency Medicine

## 2019-02-13 ENCOUNTER — Other Ambulatory Visit: Payer: Self-pay

## 2019-02-13 DIAGNOSIS — R109 Unspecified abdominal pain: Secondary | ICD-10-CM | POA: Diagnosis present

## 2019-02-13 DIAGNOSIS — N200 Calculus of kidney: Secondary | ICD-10-CM

## 2019-02-13 DIAGNOSIS — I10 Essential (primary) hypertension: Secondary | ICD-10-CM | POA: Diagnosis not present

## 2019-02-13 DIAGNOSIS — Z79899 Other long term (current) drug therapy: Secondary | ICD-10-CM | POA: Diagnosis not present

## 2019-02-13 LAB — CBC
HCT: 44.3 % (ref 39.0–52.0)
Hemoglobin: 15.4 g/dL (ref 13.0–17.0)
MCH: 31.4 pg (ref 26.0–34.0)
MCHC: 34.8 g/dL (ref 30.0–36.0)
MCV: 90.4 fL (ref 80.0–100.0)
Platelets: 206 10*3/uL (ref 150–400)
RBC: 4.9 MIL/uL (ref 4.22–5.81)
RDW: 12 % (ref 11.5–15.5)
WBC: 11.5 10*3/uL — ABNORMAL HIGH (ref 4.0–10.5)
nRBC: 0 % (ref 0.0–0.2)

## 2019-02-13 LAB — URINALYSIS, ROUTINE W REFLEX MICROSCOPIC
Bilirubin Urine: NEGATIVE
Glucose, UA: NEGATIVE mg/dL
Ketones, ur: 20 mg/dL — AB
Leukocytes,Ua: NEGATIVE
Nitrite: NEGATIVE
Protein, ur: 30 mg/dL — AB
RBC / HPF: >50 RBC/hpf — ABNORMAL HIGH (ref 0–5)
Specific Gravity, Urine: 1.026 (ref 1.005–1.030)
pH: 6 (ref 5.0–8.0)

## 2019-02-13 LAB — BASIC METABOLIC PANEL
Anion gap: 15 (ref 5–15)
BUN: 14 mg/dL (ref 6–20)
CO2: 20 mmol/L — ABNORMAL LOW (ref 22–32)
Calcium: 9.4 mg/dL (ref 8.9–10.3)
Chloride: 103 mmol/L (ref 98–111)
Creatinine, Ser: 1.01 mg/dL (ref 0.61–1.24)
GFR calc Af Amer: >60 mL/min (ref >60)
GFR calc non Af Amer: >60 mL/min (ref >60)
Glucose, Bld: 153 mg/dL — ABNORMAL HIGH (ref 70–99)
Potassium: 3.8 mmol/L (ref 3.5–5.1)
Sodium: 138 mmol/L (ref 135–145)

## 2019-02-13 MED ORDER — TAMSULOSIN HCL 0.4 MG PO CAPS: 0.4000 mg | ORAL_CAPSULE | Freq: Every day | ORAL | 0 refills | Status: DC

## 2019-02-13 MED ORDER — KETOROLAC TROMETHAMINE 15 MG/ML IJ SOLN: 15.0000 mg | Freq: Once | INTRAMUSCULAR | Status: DC

## 2019-02-13 MED ORDER — OXYCODONE-ACETAMINOPHEN 5-325 MG PO TABS: 2.0000 | ORAL_TABLET | ORAL | 0 refills | Status: DC | PRN

## 2019-02-13 MED ORDER — SODIUM CHLORIDE 0.9 % IV BOLUS
1000.0000 mL | Freq: Once | INTRAVENOUS | Status: AC
  Administered 2019-02-13: 1000 mL via INTRAVENOUS

## 2019-02-13 MED ORDER — ONDANSETRON HCL 4 MG PO TABS: 4.0000 mg | ORAL_TABLET | Freq: Three times a day (TID) | ORAL | 0 refills | Status: DC | PRN

## 2019-02-13 MED ORDER — ONDANSETRON HCL 4 MG/2ML IJ SOLN
4.0000 mg | Freq: Once | INTRAMUSCULAR | Status: AC
  Administered 2019-02-13: 4 mg via INTRAVENOUS
  Filled 2019-02-13: qty 2

## 2019-02-13 MED ORDER — KETOROLAC TROMETHAMINE 15 MG/ML IJ SOLN
30.0000 mg | Freq: Once | INTRAMUSCULAR | Status: AC
  Administered 2019-02-13: 30 mg via INTRAVENOUS
  Filled 2019-02-13: qty 2

## 2019-02-13 NOTE — ED Triage Notes (Signed)
Pt woke with sharp R flank pain at 0330, pain radiates to RLQ. Emesis x 3, states he may have had slight blood tinge in urine earlier

## 2019-02-13 NOTE — Discharge Instructions (Signed)
You were found to have a kidney stone that will likely pass on it's own.  You were discharged home with flomax to take for 4 days, as well as medicine for pain and nausea.  Follow up with urology is recommended if your symptoms persist.
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
## 2019-04-13 ENCOUNTER — Other Ambulatory Visit: Payer: Self-pay | Admitting: Family Medicine

## 2019-04-13 DIAGNOSIS — F430 Acute stress reaction: Secondary | ICD-10-CM

## 2019-06-14 ENCOUNTER — Ambulatory Visit (INDEPENDENT_AMBULATORY_CARE_PROVIDER_SITE_OTHER): Payer: Managed Care, Other (non HMO)

## 2019-06-14 ENCOUNTER — Other Ambulatory Visit: Payer: Self-pay

## 2019-06-14 DIAGNOSIS — Z23 Encounter for immunization: Secondary | ICD-10-CM

## 2019-06-28 ENCOUNTER — Ambulatory Visit: Payer: Managed Care, Other (non HMO) | Admitting: Family Medicine

## 2019-07-14 ENCOUNTER — Other Ambulatory Visit: Payer: Self-pay | Admitting: Family Medicine

## 2019-07-14 DIAGNOSIS — F430 Acute stress reaction: Secondary | ICD-10-CM

## 2019-07-17 ENCOUNTER — Other Ambulatory Visit: Payer: Self-pay

## 2019-07-17 DIAGNOSIS — Z20822 Contact with and (suspected) exposure to covid-19: Secondary | ICD-10-CM

## 2019-07-18 ENCOUNTER — Encounter: Payer: Self-pay | Admitting: Family Medicine

## 2019-07-19 ENCOUNTER — Telehealth: Payer: Self-pay | Admitting: Family Medicine

## 2019-07-19 LAB — NOVEL CORONAVIRUS, NAA: SARS-CoV-2, NAA: NOT DETECTED

## 2019-07-19 NOTE — Telephone Encounter (Signed)
Called pt back advised him to seek treatment @ [facility] or nearest Urgent Care if symptoms continue or worsen. ---Patient agreeable .  fyi to med asst.  -glh

## 2019-07-19 NOTE — Telephone Encounter (Signed)
Patient called states he had severe cold symptoms (was COVID tested 2 dys ago / Results negative).  -- Forwarding message that Pt still having chills & other cold symptoms-- seeks advice--call pt @ [phone]  --glh

## 2019-07-19 NOTE — Telephone Encounter (Signed)
Due to Dr. Jenetta Downer being out of the office please advise patient to go to [facility] for care. AS, CMA

## 2019-07-22 ENCOUNTER — Telehealth: Payer: Self-pay | Admitting: Family Medicine

## 2019-07-22 NOTE — Telephone Encounter (Signed)
Patient called again 1st thing this morning states DID NOT go to Urgent Care over weekend but has scheduled himself for a 2nd COVID test (Rapid) @ a somewhere---states his chills & other cold symptoms are still present.---Patient wants to be advised on what to do?  ---Forwarding urgent message to med asst.to please call Patient @ [phone].   --glh.

## 2019-07-22 NOTE — Telephone Encounter (Signed)
Pt questioned whether he should go to a [facility] drive thru site of if the rapid test is just as accurate.  Advised pt that the rapid test is not quite as accurate as the test performed thru [facility].  Pt states that he will have test done at [facility] and pt was given directions re: location of testing.  Advised pt that if he has a negative result and wishes to be seen in the office, he may call to schedule an OV.  Pt expressed understanding and is agreeable.  Charyl Bigger, CMA

## 2019-07-23 ENCOUNTER — Other Ambulatory Visit: Payer: Self-pay

## 2019-07-23 DIAGNOSIS — Z20822 Contact with and (suspected) exposure to covid-19: Secondary | ICD-10-CM

## 2019-07-24 LAB — NOVEL CORONAVIRUS, NAA: SARS-CoV-2, NAA: NOT DETECTED

## 2019-08-08 ENCOUNTER — Other Ambulatory Visit: Payer: Self-pay | Admitting: Family Medicine

## 2019-08-08 DIAGNOSIS — F430 Acute stress reaction: Secondary | ICD-10-CM

## 2019-08-12 ENCOUNTER — Other Ambulatory Visit: Payer: Self-pay | Admitting: Family Medicine

## 2019-08-12 DIAGNOSIS — F430 Acute stress reaction: Secondary | ICD-10-CM

## 2019-08-20 ENCOUNTER — Other Ambulatory Visit: Payer: Self-pay | Admitting: Family Medicine

## 2019-08-20 DIAGNOSIS — I152 Hypertension secondary to endocrine disorders: Secondary | ICD-10-CM

## 2019-08-20 DIAGNOSIS — E1159 Type 2 diabetes mellitus with other circulatory complications: Secondary | ICD-10-CM

## 2019-08-23 ENCOUNTER — Other Ambulatory Visit: Payer: Self-pay | Admitting: Family Medicine

## 2019-08-23 DIAGNOSIS — F430 Acute stress reaction: Secondary | ICD-10-CM

## 2019-09-04 ENCOUNTER — Other Ambulatory Visit: Payer: Self-pay

## 2019-09-04 ENCOUNTER — Ambulatory Visit (INDEPENDENT_AMBULATORY_CARE_PROVIDER_SITE_OTHER): Payer: Managed Care, Other (non HMO) | Admitting: Family Medicine

## 2019-09-04 ENCOUNTER — Encounter: Payer: Self-pay | Admitting: Family Medicine

## 2019-09-04 VITALS — BP 120/81 | HR 77 | Ht 72.0 in | Wt 234.0 lb

## 2019-09-04 DIAGNOSIS — F430 Acute stress reaction: Secondary | ICD-10-CM | POA: Diagnosis not present

## 2019-09-04 DIAGNOSIS — E1169 Type 2 diabetes mellitus with other specified complication: Secondary | ICD-10-CM

## 2019-09-04 DIAGNOSIS — Z9119 Patient's noncompliance with other medical treatment and regimen: Secondary | ICD-10-CM

## 2019-09-04 DIAGNOSIS — F4323 Adjustment disorder with mixed anxiety and depressed mood: Secondary | ICD-10-CM | POA: Diagnosis not present

## 2019-09-04 DIAGNOSIS — M792 Neuralgia and neuritis, unspecified: Secondary | ICD-10-CM

## 2019-09-04 DIAGNOSIS — I10 Essential (primary) hypertension: Secondary | ICD-10-CM

## 2019-09-04 DIAGNOSIS — I152 Hypertension secondary to endocrine disorders: Secondary | ICD-10-CM

## 2019-09-04 DIAGNOSIS — Z91199 Patient's noncompliance with other medical treatment and regimen due to unspecified reason: Secondary | ICD-10-CM

## 2019-09-04 DIAGNOSIS — E1159 Type 2 diabetes mellitus with other circulatory complications: Secondary | ICD-10-CM

## 2019-09-04 DIAGNOSIS — Z87442 Personal history of urinary calculi: Secondary | ICD-10-CM

## 2019-09-04 DIAGNOSIS — Z8673 Personal history of transient ischemic attack (TIA), and cerebral infarction without residual deficits: Secondary | ICD-10-CM

## 2019-09-04 NOTE — Progress Notes (Signed)
Telehealth office visit note for Nathan Kelley, D.O- at Primary Care at [facility]   I connected with current patient today and verified that I am speaking with the correct person using two identifiers.   . Location of the patient: Home . Location of the provider: Office Only the patient (+/- their family members at pt's discretion) and myself were participating in the encounter - This visit type was conducted due to national recommendations for restrictions regarding the COVID-19 Pandemic (e.g. social distancing) in an effort to limit this patient's exposure and mitigate transmission in our community.  This format is felt to be most appropriate for this patient at this time.   - The patient did not have access to video technology or had technical difficulties with video requiring transitioning to audio format only. - No physical exam could be performed with this format, beyond that communicated to [place] by the patient/ family members as noted.   - Additionally my office staff/ schedulers discussed with the patient that there may be a monetary charge related to this service, depending on their medical insurance.   The patient expressed understanding, and agreed to proceed.       History of Present Illness: Hypertension, Diabetes, and Depression    I, [name], am serving as scribe for Dr. Mellody Kelley.   States today "I'm hanging in there with the rest of the world."  He continues working full time.   Thinks his main health problems are with regards to dieting, weight gain, and not exercising as much.  "I think that 30 days from now, I can knock off fifteen lbs."  He plans to implement keto diet and exercise.   Notes he uses a WHOOP band to track his health data, including pulse, etc.   - Recent Kidney Stone Notes he recently had a kidney stone, and it was incredibly painful. No longer with sx- resolved   Mood- Dep/anxiety: stable on meds. No concerns -  Patient has a history of DEP/ANXIETY and psychiatric illness in the family.  26 years ago his brother committed suicide which led patient himself to a downfall of binge drinking and detrimental activities for himself.  Patient is under good control currently.  Approximately 7-9 years ago went on Prozac by Dr. Linna Darner.  Was only on it for one year.  - I restarted pt on it first APPT WITH ME 02/16/17   HPI:   Diabetes Mellitus:  Home glucose readings:  "My sugar is high."  Notes it was 159 this morning before eating.  States it's been up and down over the past month.  "I came off my diet probably four months ago, and you can see, my weight's gone up, so I think that has something to do with it."  Notes he plans to go back on the diet on Friday (the new year).  Says "if I don't exercise and I gain weight, my sugars go up."  - His denies acute concerns or problems related to treatment plan.  Thinks he's taken a dozen metformin in the last four months.  "I still have plenty left over."  - He denies new concerns.  Denies polyuria/polydipsia, hypo/ hyperglycemia symptoms.  Denies new onset of: chest pain, exercise intolerance, shortness of breath, dizziness, visual changes, headache, lower extremity swelling or claudication.   Last A1C in the office was:  Lab Results  Component Value Date   HGBA1C 5.5 06/04/2018   HGBA1C 5.4 02/05/2018  HGBA1C 8.2 11/06/2017   Lab Results  Component Value Date   LDLCALC 50 06/04/2018   CREATININE 1.01 02/13/2019   BP Readings from Last 3 Encounters:  09/04/19 120/81  02/13/19 120/80  12/03/18 122/72   Wt Readings from Last 3 Encounters:  09/04/19 234 lb (106.1 kg)  02/13/19 225 lb 15.5 oz (102.5 kg)  12/03/18 226 lb (102.5 kg)    HPI:  Hypertension:  -  His blood pressure at home has been running: "My blood pressure is good, I think it's really good."  Notes his BP is running around around 121/80, 119/78.  Notes his resting heart rate is around  65.  - His denies acute concerns or problems related to treatment plan  - He denies new onset of: chest pain, exercise intolerance, shortness of breath, dizziness, visual changes, headache, lower extremity swelling or claudication.   Last 3 blood pressure readings in our office are as follows: BP Readings from Last 3 Encounters:  09/04/19 120/81  02/13/19 120/80  12/03/18 122/72   Filed Weights   09/04/19 0917  Weight: 234 lb (106.1 kg)     HPI:  Chol-  LDL always at goal.  Has never been on medications despite having history of TIA and diabetes. Pt has declined in past.   Notes he has gone off of his diet.  He is not currently taking medication.  Most recent cholesterol panel was:  Lab Results  Component Value Date   CHOL 137 06/04/2018   HDL 65 06/04/2018   LDLCALC 50 06/04/2018   TRIG 109 06/04/2018   CHOLHDL 2.1 06/04/2018   Hepatic Function Latest Ref Rng & Units 06/04/2018 02/27/2017 10/12/2015  Total Protein 6.0 - 8.5 g/dL 6.8 7.2 7.4  Albumin 3.5 - 5.5 g/dL 4.4 4.5 4.5  AST 0 - 40 IU/L _0 ALT 0 - 44 IU/L 28 44 36  Alk Phosphatase 39 - 117 IU/L 54 59 54  Total Bilirubin 0.0 - 1.2 mg/dL 0.6 0.4 0.4  Bilirubin, Direct 0.0 - 0.3 mg/dL - - -    GAD 7 : Generalized Anxiety Score 09/04/2019 02/16/2017  Nervous, Anxious, on Edge 1 1  Control/stop worrying 0 1  Worry too much - different things 1 1  Trouble relaxing 0 1  Restless 0 0  Easily annoyed or irritable 0 1  Afraid - awful might happen 0 1  Total GAD 7 Score 2 6  Anxiety Difficulty Not difficult at all Somewhat difficult    Depression screen [facility] 2/9 09/04/2019 07/03/2018 02/05/2018 11/27/2017 11/06/2017  Decreased Interest 0 0 0 0 0  Down, Depressed, Hopeless 0 1 0 0 0  PHQ - 2 Score 0 1 0 0 0  Altered sleeping _1 0 1  Tired, decreased energy 1 0 0 1 1  Change in appetite 1 0 0 0 0  Feeling bad or failure about yourself  0 0 0 0 0  Trouble concentrating 0 1 0 0 0  Moving slowly or fidgety/restless  0 0 0 0 0  Suicidal thoughts 0 0 0 0 0  PHQ-9 Score _2 Difficult doing work/chores Not difficult at all Not difficult at all Not difficult at all Not difficult at all -      Impression and Recommendations:    1. Type 2 diabetes mellitus with other specified complication, without long-term current use of insulin ([place])   2. Hypertension associated with diabetes ([place])   3. Morbid obesity (  [place])   4. Personal history of TIA (transient ischemic attack)   5. H/O noncompliance with medical treatment, presenting hazards to health   6. Acute reaction to situational stress   7. Adjustment disorder with mixed anxiety and depressed mood   8. Personal History of nephrolithiasis Chronic  9. Neuropathic pain of right foot      Diet-Controlled DM - Per patient, has discontinued his diet for four months. - Need for re-check A1c near future; 5.5 last check over one year ago on 06/04/2018. -Patient was on Metformin for short period with me with great response and patient tolerated well.  But very successful keeping it in check with Keto diet subsequently. -Patient was told in 06/2018 to follow-up in 4 to 6 months for repeat A1c and vitamin D and never did.  - Pt declines going back on metformin at this time.  He wants to get back on his dietary regimen, which in the past he proved very successful at.   -However I told patient to come in in the very near future so we can see where his A1c is at now!!  - Counseled patient on pathophysiology of disease and discussed various treatment options, which always includes dietary and lifestyle modification as first line.    - Importance of low carb, heart-healthy diet discussed with patient in addition to regular aerobic exercise of 7mn 5d/week or more.   - Check FBS and 2 hours after the biggest meal of your day.  Keep log and bring in next OV for my review.     - Also told patient if you ever feel poorly, please check your blood pressure and blood  sugar, as one or the other could be the cause of your symptoms.  - Pt reminded about need for yearly eye and foot exams.  Told patient to make appt.for diabetic eye exam, CMAs here will do foot exams  - Handouts provided at patient's desire and or told to go online at the American Diabetes Association website for further information  - We will continue to monitor   Hypertension associated with DM - Blood pressure currently is at goal. - Per patient, consistently under 135/85 at home. - on ARB  - Patient will continue current treatment regimen.  See med list.  - Counseled patient on pathophysiology of disease and discussed various treatment options, which always includes dietary and lifestyle modification as first line.   - Lifestyle changes such as dash and heart healthy diets and engaging in a regular exercise program discussed extensively with patient.   - Ambulatory blood pressure monitoring encouraged at least 3 times weekly.  Keep log and bring in every office visit.  Reminded patient that if they ever feel poorly in any way, to check their blood pressure and pulse.  - Handouts provided at patient's desire and/or told to go online at the [place] for further information  - We will continue to monitor.   Personal History of TIA -Takes aspirin daily. -  Advised patient to consider beginning management on statin, (given history of DM,TIA and family history early coronary artery disease of father at age [age]died) despite normal LDL..Marland Kitchen- Last FLP obtained 06/04/2018.  Need for re-check near future.  - Encouraged patient to continue to monitor his blood pressure and blood sugars, with goal of maintaining BP and blood glucose WNL.  Told patient to engage in prudent exercise and dietary habits, and encouraged prudent weight loss as patient's BMI is  elevated over 30.  - Will continue to monitor.   Mood:  -Well-controlled on current regimen of Prozac   Has  significant personal history of medical noncompliance: -Importance of regular monitoring of labs, A1c etc. discussed with patient.  He understands when he is not accountable to someone, he tends to fall off his "wagon ". -Importance of obtaining labs on regular basis.  Reminded patient is been over a year and 3+ months since last checked when he was instructed to come in in 4-6 months   BMI Counseling - Body mass index is 31.74 kg/m, Morbid Obesity Explained to patient what BMI refers to, and what it means medically.    Told patient to think about it as a "medical risk stratification measurement" and how increasing BMI is associated with increasing risk/ or worsening state of various diseases such as hypertension, hyperlipidemia, diabetes, premature OA, depression etc.  American Heart Association guidelines for healthy diet, basically Mediterranean diet, and exercise guidelines of 30 minutes 5 days per week or more discussed in detail.  Health counseling performed.  All questions answered.  - Extensive counseling provided regarding keto diet today.  Discussed risks, benefits, and alternatives, and emphasized need for patient to be monitored while on any diet plan.  - As patient desires to lose weight, encouraged patient to look into the Weight Watchers diet / lifestyle.  Lifestyle & Preventative Health Maintenance - Advised patient to continue working toward exercising to improve overall mental, physical, and emotional health.    - Reviewed the "spokes of the wheel" of mood and health management.  Stressed the importance of ongoing prudent habits, including regular exercise, appropriate sleep hygiene, healthful dietary habits, and prayer/meditation to relax.  - Encouraged patient to engage in daily physical activity, especially a formal exercise routine.  Recommended that the patient eventually strive for at least 150 minutes of moderate cardiovascular activity per week according to guidelines  established by the [facility].   - Healthy dietary habits encouraged, including low-carb, and high amounts of lean protein in diet.   - Patient should also consume adequate amounts of water.  Recommendations - Overdue for lab work; last checked 9 of 2019. - Return in next week or two for baseline labs and CPE. -Reassured patient we will re-check labs again in 3-4 months after lifestyle changes are made.   - As part of my medical decision making, I reviewed the following data within the [place] History obtained from pt /family, CMA notes reviewed and incorporated if applicable, Labs reviewed, Radiograph/ tests reviewed if applicable and OV notes from prior OV's with me, as well as other specialists she/he has seen since seeing me last, were all reviewed and used in my medical decision making process today.    - Additionally, discussion had with patient regarding our treatment plan, and their biases/concerns about that plan were used in my medical decision making today.    - The patient agreed with the plan and demonstrated an understanding of the instructions.   No barriers to understanding were identified.    - Red flag symptoms and signs discussed in detail.  Patient expressed understanding regarding what to do in case of emergency\ urgent symptoms.   - The patient was advised to call back or seek an in-person evaluation if the symptoms worsen or if the condition fails to improve as anticipated.   Return for CPE near very future with full fasting blood work, re-check labs 3-4 mo after lifestyle changes.  Medications Discontinued During This Encounter  Medication Reason  . fexofenadine (ALLEGRA) 180 MG tablet Error  . FLUoxetine (PROZAC) 20 MG capsule Error  . Melatonin 5 MG TABS Error  . ondansetron (ZOFRAN) 4 MG tablet Error  . oxyCODONE-acetaminophen (PERCOCET/ROXICET) 5-325 MG tablet Error  . vitamin E 400 UNIT capsule Error     I provided 24+ minutes of non  face-to-face time during this encounter.  Additional time was spent with charting and coordination of care before and after the actual visit commenced.   Note:  This note was prepared with assistance of Dragon voice recognition software. Occasional wrong-word or sound-a-like substitutions may have occurred due to the inherent limitations of voice recognition software.  This document serves as a record of services personally performed by Nathan Dance, DO. It was created on her behalf by [name], a trained medical scribe. The creation of this record is based on the scribe's personal observations and the provider's statements to them.   This case required medical decision making of at least moderate complexity. The above documentation has been reviewed to be accurate and was completed by [name], D.O.       Patient Care Team    Relationship Specialty Notifications Start End  Nathan Dance, DO PCP - General Family Medicine  02/16/17      -Vitals obtained; medications/ allergies reconciled;  personal medical, social, Sx etc.histories were updated by CMA, reviewed by me and are reflected in chart   Patient Active Problem List   Diagnosis Date Noted  . H/O noncompliance with medical treatment, presenting hazards to health Oct 19, 2019  . Morbid obesity ([place])- bmi 30+ with DM, HTN, h/o TIA 12/03/2018  . Diabetes mellitus ([place])- diet controlled 11/27/2017  . Hypertension associated with diabetes ([place]) 11/26/2009  . Personal history of TIA (transient ischemic attack) 12/23/2008  . Family history of early CAD-  Dad died age 89 AMI w CHF 10-19-2019  . Family history of diabetes mellitus- Mom and sister 02/16/2017  . Acute reaction to situational stress 02/16/2017  . Sleep disorder 06/25/2014  . ANXIETY 07/13/2009  . Neuropathic pain of right foot 02/16/2017  . Numbness of foot 10/13/2015  . Adjustment disorder with mixed anxiety and depressed mood 10/19/2019  .  Personal History of nephrolithiasis 2019/10/19  . Chronic pansinusitis 11/27/2017  . Pain and swelling of left elbow 06/28/2017  . Plantar fasciitis 02/16/2017  . Obesity 10/13/2015  . GERD 10/10/2007  . Other dysphagia 10/10/2007     Current Meds  Medication Sig  . acetaminophen (TYLENOL) 500 MG tablet Take 1,000 mg by mouth every 6 (six) hours as needed for mild pain or headache.  Marland Kitchen aspirin 325 MG tablet Take 325 mg by mouth daily.    . fluticasone (FLONASE) 50 MCG/ACT nasal spray 1 spray each nostril following sinus rinse twice daily (Patient taking differently: Place 1 spray into both nostrils daily as needed for allergies. 1 spray each nostril following sinus rinse twice daily)  . loperamide (IMODIUM) 2 MG capsule Take 4 mg by mouth as needed for diarrhea or loose stools.  . naproxen sodium (ALEVE) 220 MG tablet Take 220 mg by mouth as needed (pain).  Marland Kitchen OVER THE COUNTER MEDICATION Apply 1 application topically daily as needed (pain). CBD cream  . tamsulosin (FLOMAX) 0.4 MG CAPS capsule Take 1 capsule (0.4 mg total) by mouth daily.  . Zinc 50 MG CAPS Take by mouth.  . [DISCONTINUED] losartan (COZAAR) 100 MG tablet Take 1 tablet (100 mg  total) by mouth daily. **PATIENT NEEDS APT FOR FURTHER REFILLS**     Allergies:  No Known Allergies   ROS:  See above HPI for pertinent positives and negatives   Objective:   Blood pressure 120/81, pulse 77, height 6' (1.829 m), weight 234 lb (106.1 kg). Body mass index is 31.74 kg/m. (if some vitals are omitted, this means that patient was UNABLE to obtain them even though they were asked to get them prior to [place] today.  They were asked to call us at their earliest convenience with these once obtained. )  General: A & O * 3; sounds in no acute distress; in usual state of health.  Skin: Pt confirms warm and dry extremities and pink fingertips HEENT: Pt confirms lips non-cyanotic Chest: Patient confirms normal chest excursion and  movement Respiratory: speaking in full sentences, no conversational dyspnea; patient confirms no use of accessory muscles Psych: insight appears good, mood- appears full

## 2019-09-10 ENCOUNTER — Other Ambulatory Visit: Payer: Self-pay | Admitting: Family Medicine

## 2019-09-10 DIAGNOSIS — E1159 Type 2 diabetes mellitus with other circulatory complications: Secondary | ICD-10-CM

## 2019-09-10 DIAGNOSIS — F430 Acute stress reaction: Secondary | ICD-10-CM

## 2019-09-10 DIAGNOSIS — I152 Hypertension secondary to endocrine disorders: Secondary | ICD-10-CM

## 2019-09-22 DIAGNOSIS — Z9119 Patient's noncompliance with other medical treatment and regimen: Secondary | ICD-10-CM | POA: Insufficient documentation

## 2019-09-22 DIAGNOSIS — Z87442 Personal history of urinary calculi: Secondary | ICD-10-CM | POA: Insufficient documentation

## 2019-09-22 DIAGNOSIS — Z8249 Family history of ischemic heart disease and other diseases of the circulatory system: Secondary | ICD-10-CM | POA: Insufficient documentation

## 2019-09-22 DIAGNOSIS — Z91199 Patient's noncompliance with other medical treatment and regimen due to unspecified reason: Secondary | ICD-10-CM | POA: Insufficient documentation

## 2019-09-22 DIAGNOSIS — F411 Generalized anxiety disorder: Secondary | ICD-10-CM | POA: Insufficient documentation

## 2019-09-22 DIAGNOSIS — F4323 Adjustment disorder with mixed anxiety and depressed mood: Secondary | ICD-10-CM | POA: Insufficient documentation

## 2019-09-30 ENCOUNTER — Telehealth: Payer: Self-pay | Admitting: Family Medicine

## 2019-09-30 DIAGNOSIS — F430 Acute stress reaction: Secondary | ICD-10-CM

## 2019-09-30 MED ORDER — FLUOXETINE HCL 20 MG PO CAPS: 20.0000 mg | ORAL_CAPSULE | Freq: Every day | ORAL | 0 refills | Status: DC

## 2019-09-30 NOTE — Telephone Encounter (Signed)
Patient was seen for e visit on 09/04/19 and had put in a request for a refill of his fluoxetine. He however only got 15 pills. He wants to know if this was a mistake. He is requesting his full refill be sent to [facility] (not [facility]) and would like a call back to confirm when this done.

## 2019-09-30 NOTE — Addendum Note (Signed)
Addended by: Sylvester Harder on: 09/30/2019 11:29 AM   Modules accepted: Orders

## 2019-09-30 NOTE — Telephone Encounter (Signed)
Rx sent to pharmacy. AS, CMA 

## 2019-10-05 ENCOUNTER — Other Ambulatory Visit: Payer: Self-pay | Admitting: Family Medicine

## 2019-10-05 DIAGNOSIS — E1159 Type 2 diabetes mellitus with other circulatory complications: Secondary | ICD-10-CM

## 2019-10-05 DIAGNOSIS — I152 Hypertension secondary to endocrine disorders: Secondary | ICD-10-CM

## 2019-10-14 ENCOUNTER — Encounter: Payer: Managed Care, Other (non HMO) | Admitting: Family Medicine

## 2019-10-20 ENCOUNTER — Ambulatory Visit (INDEPENDENT_AMBULATORY_CARE_PROVIDER_SITE_OTHER): Payer: Commercial Managed Care - POS | Admitting: Family

## 2019-10-20 ENCOUNTER — Encounter (INDEPENDENT_AMBULATORY_CARE_PROVIDER_SITE_OTHER): Payer: Self-pay | Admitting: Family

## 2019-10-20 ENCOUNTER — Other Ambulatory Visit
Admission: RE | Admit: 2019-10-20 | Discharge: 2019-10-20 | Disposition: A | Discharge status: Home / Self Care | Payer: Commercial Managed Care - POS | Source: Ambulatory Visit | Attending: Family | Admitting: Family

## 2019-10-20 VITALS — BP 130/82 | HR 74 | Temp 98.2°F | Resp 18 | Ht 69.0 in | Wt 268.0 lb

## 2019-10-20 DIAGNOSIS — R300 Dysuria: Secondary | ICD-10-CM

## 2019-10-20 DIAGNOSIS — M545 Low back pain, unspecified: Secondary | ICD-10-CM

## 2019-10-20 LAB — VH POCT UA-AUTOMATED(UCC)
Bilirubin, UA POCT: NEGATIVE
Blood, UA POCT: NEGATIVE
Glucose, UA POCT: NEGATIVE
Ketones, UA POCT: NEGATIVE mg/dL
Nitrite, UA POCT: NEGATIVE
PH, UA POCT: 6.5 (ref 4.6–8)
Protein, UA POCT: NEGATIVE mg/dL
Specific Gravity, UA POCT: 1.02 mg/dL (ref 1.001–1.035)
Urine Leukocytes POCT: NEGATIVE
Urobilinogen, UA POCT: 0.2 mg/dL

## 2019-10-20 LAB — POCT CHEM8
Anion Gap, VH POCT: 14 mmol/L (ref 10–20)
BUN, VH POCT: 11 mg/dL (ref 6–20)
Chloride, VH POCT: 103 mmol/L (ref 98–112)
Creatinine, VH POCT: 0.9 mg/dL (ref 0.9–1.3)
Glucose, VH POCT: 99 mg/dL (ref 70–99)
Ionized Calcium, VH POCT: 4.7 mg/dL (ref 4.35–5.1)
Potassium, VH POCT: 3.8 mmol/L (ref 3.5–5.3)
Sodium, VH POCT: 137 mmol/L (ref 135–145)
Total Co2, VH POCT: 24 mmol/L (ref 24–29)

## 2019-10-20 LAB — VH UCC CBC POCT
VH UCC # GR, POCT: 4.4 10*9/L (ref 1.2–8.0)
VH UCC # LYMPH, POCT: 0.7 10*9/L (ref 0.5–5.0)
VH UCC # MONO, POCT: 1 10*9/L (ref 0.1–1.5)
VH UCC % GR, POCT: 72.8 % (ref 35–80)
VH UCC % LYMPH, POCT: 12.7 % — AB (ref 15–50)
VH UCC % MONO, POCT: 14.5 % (ref 2–15)
VH UCC HCT, POCT: 46 % (ref 35.0–55.0)
VH UCC HGB, POCT: 15.2 g/dL (ref 11.5–16.5)
VH UCC MCH, POCT: 27.8 pg (ref 25.0–35.0)
VH UCC MCHC, POCT: 33.1 g/dL (ref 31.0–38.0)
VH UCC MCV, POCT: 83.8 fL (ref 75.0–100)
VH UCC MPV, POCT: 7.5 fL — AB (ref 8–11)
VH UCC PLT, POCT: 217 10*9/L (ref 100–400)
VH UCC RBC, POCT: 5.49 10*12/L (ref 3.5–5.5)
VH UCC RDW, POCT: 12.7 % (ref 11–16)
VH UCC WBC, POCT: 6.1 10*9/L (ref 3.5–10)

## 2019-10-20 NOTE — Progress Notes (Signed)
Urine CX sent to [facility], charges applied

## 2019-10-20 NOTE — Progress Notes (Signed)
Date Specimen Drawn:  10/20/2019   Time Specimen Drawn:  10:35 AM   Test(s) Ordered:  CBC,ISTAT   Disposition:  n/a   Patient's Tolerance:  Good   Location Specimen Drawn:  Right hand     1/1

## 2019-10-20 NOTE — Patient Instructions (Signed)
Understanding Lumbosacral Strain    Lumbosacral strain is a medical term for an injury that causes low back pain. The lumbosacral area (low back) is between the bottom of the ribcage and the top of the buttocks. A strain is tearing of muscles and tendons. These tears can be very small but still cause pain.   How a lumbosacral strain happens  Muscles and tendons connected to the spine can be strained in a number of ways:   Sitting or standing in the same position for long periods of time. This can harm the low back over time. Poor posture can make low back pain more likely.   Moving the muscles and tendons past their usual range of motion. This can cause a sudden injury. This can happen when you twist, bend over, or lift something heavy. Not using correct technique for sports or tasks like lifting can make back injury more likely.   Accidents or falls  Lumbosacral strain can be caused by other problems, but these are less common.  Symptoms of lumbosacral strain  Symptoms may include:   Pain in the back, often on one side   Pain that gets worse with movement and gets better with rest   Inability to move as freely as usual   Swelling, slight redness, and skin warmth in the painful area  Treatment for lumbosacral strain  Low back pain often goes away by itself within several weeks. But it often comes back. Treatment focuses on reducing pain and avoiding further injury. Bed rest is usually not recommended for low back pain. Treatments may include:    Avoiding or changing the action that caused the problem. This helps prevent injuring the tissues again.   Prescription or over-the-counter medicines. These help reduce inflammation, swelling, and pain. NSAIDs (nonsteroidal anti-inflammatory drugs) are the most common medicines used. Medicines may be prescribed or bought over the counter. They may be given as pills. Or they may be put on the skin a gel, cream, or patch.   Cold or heat packs. These help reduce pain  and swelling.   Stretching and other exercises.  These improve flexibility and strength.   Physical therapy. This usually includes exercises and other treatments.   Injections of medicine. This may relieve symptoms. The medicine is usually a corticosteroid. This is a strong anti-inflammatory medicine.  If these treatments don't relieve symptoms, your healthcare provider may order imaging tests to learn more about the problem. Sometimes you may need surgery.   Possible complications of lumbosacral strain  If the cause of the pain is not addressed, symptoms may return or get worse. Follow your healthcare provider's instructions on lifestyle changes and treating your back.   When to call your healthcare provider  Call your healthcare provider right away if you have any of these:   Fever of 100.4F (38C) or higher, or as directed by your rrovider   Chills   Numbness, tingling, or weakness   Problems with bowel or bladder control, or problems having sex   Pain that does not go away, or gets worse   New symptoms  [employer] last reviewed this educational content on 02/03/2018   2000-2020 [employer] [address], [place] [address]. All rights reserved. This information is not intended as a substitute for professional medical care. Always follow your healthcare professional's instructions.        Back Pain (Acute or Chronic)    Back pain is one of the most common problems. The   good news is that most people feel better in 1 to 2 weeks, and most of the rest in 1 to 2 months. Most people can remain active.  People who have paindescribe it differently-noteveryone is the same.   The pain can be sharp, stabbing, shooting, aching, cramping or burning.   Movement, standing, bending, lifting, sitting, or walking may worsen pain.   It can be limited to one spot or area, or it can be more generalized.   It can spread upwards, to the front, or go down your arms or legs (sciatica).   It can cause  muscle spasm.  Most of the time, mechanical problems with the musclesor spine cause the pain. Mechanical problemsare usually caused by an injury to the muscles or ligaments. Illness can cause back pain, but it's usually not caused by a serious illness. Mechanical problems include:   Physical activity such as sports, exercise, work, or normal activity   Overexertion, lifting, pushing, pulling incorrectly or too aggressively   Sudden twisting, bending, or stretching from an accident, or accidental movement   Poor posture   Stretching or moving wrong, without noticing pain at the time   Poor coordination, lack of regular exercise (check with your doctor about this)   Spinal disc disease or arthritis   Stress  Pain can also be related to pregnancy, or illness like appendicitis, bladder or kidney infections, pelvic infections, and many other things.  Acute back pain usually gets better in1 to 2 weeks. Back pain related to disk disease, arthritis in the spinal joints, or narrowing of the spinal canal (spinal stenosis) can become chronic and last for months or years.  Unless you had a physical injury such as a car accident or fall, X-rays are usually not needed for the first assessment of back pain. If pain continues and does not respond to medical treatment, you may need X-rays and other tests.  Home care  Try this home care advice:   When in bed, tryto find a position of comfort. A firm mattress is best. Try lying flat on your back with pillows under your knees. You can also try lying on your side with your knees bent up toward your chest and a pillow between your knees.   At first, don't try to stretch out the sore spots. If there is a strain, it's not like the good soreness you get after exercising without an injury. In this case, stretching may make it worse.   Don't sit for long periods, as in a long car ride or during othertravel. This puts more stress on the lower back than standing or walking.    During the first 24 to 72 hours after an acute injury or flare up of chronic back pain, apply an ice pack to the painful area for 20 minutes and then remove it for 20 minutes. Do this over a period of 60 to 90 minutes or several times a day. This will reduce swelling and pain. Wrap the ice pack in a thin towel or plastic to protect your skin.   You can start with ice, then switch to heat. Heat (hot shower, hot bath, or heating pad) reduces pain and works well for muscle spasms. Heat can be applied to the painful area for 20 minutes then remove it for 20 minutes. Do this over a period of 60 to 90 minutes or several times a day. Don't sleep on a heating pad. It can lead to skin burns or tissue damage.     You can alternate ice and heat therapy. Talk with your doctor aboutthe best treatment for your back pain.   Therapeutic massage can help relax the back muscles without stretching them.   Be aware of safe lifting methods and don't lift anything without stretching first.  Medicines  Talk to your doctor before using medicine, especially if you have other medical problems or are taking other medicines.   You may use over-the-counter medicine as directed on the bottle to control pain, unless another pain medicine was prescribed. If you have chronic conditions like diabetes, liver or kidney disease, stomach ulcers, or gastrointestinal bleeding, or are taking blood thinners, talk to your doctor before taking any medicine.   Be careful if you are given a prescription medicines, narcotics, or medicine for muscle spasms. They can cause drowsiness, affect your coordination, reflexes, and judgement. Don't drive or operate heavy machinery.  Follow-up care  Follow up with your healthcare provider, or as advised.  If X-rays were taken, you will be told of any new findings that may affect your care  Call 911  Call 911 if any of the following occur:   Trouble breathing   Confusion   Very drowsy or trouble awakening    Fainting or loss of consciousness   Rapid or very slow heart rate   Loss of bowel or bladder control  When to seek medical advice  Call your healthcare provider right away if any of these occur:   Pain becomes worse or spreads to your legs   Weakness or numbness in one or both legs   Numbness in the groin or genital area  [employer] last reviewed this educational content on 06/05/2018   2000-2020 [employer] [address], [place] [address]. All rights reserved. This information is not intended as a substitute for professional medical care. Always follow your healthcare professional's instructions.

## 2019-10-20 NOTE — Progress Notes (Signed)
Subjective:    Patient ID: Peter Ramos is a 59 y.o. male.    HPI  Pt presents today with what he thinks is a UTI. Pt has been having bilateral lower back pain since Friday. Pt states he does not remember any trauma to the area. Pt states it is hard to lie down and prefers sitting up with heating pad. Heating pad does relieve some of the pressure  The following portions of the patient's history were reviewed and updated as appropriate: allergies, current medications, past family history, past medical history, past social history, past surgical history and problem list.    Review of Systems   Constitutional: Negative for activity change, fatigue and fever.   HENT: Negative for congestion, ear pain, sinus pressure, sinus pain and sore throat.    Eyes: Negative for pain, discharge, redness and itching.   Respiratory: Negative for cough, chest tightness and shortness of breath.    Cardiovascular: Negative for chest pain and leg swelling.   Gastrointestinal: Negative for abdominal pain.   Endocrine: Negative for cold intolerance and heat intolerance.   Musculoskeletal: Positive for back pain and myalgias. Negative for joint swelling.   Skin: Negative for color change, pallor, rash and wound.   Allergic/Immunologic: Negative for environmental allergies and food allergies.   Neurological: Negative for dizziness and headaches.         Objective:    BP 130/82   Pulse 74   Temp 98.2 F (36.8 C) (Oral)   Resp 18   Ht 1.753 m (5\' 9" )   Wt 121.6 kg (268 lb)   BMI 39.58 kg/m     Physical Exam  Constitutional:       Appearance: He is well-developed.   HENT:      Head: Normocephalic.      Right Ear: Hearing, tympanic membrane, ear canal and external ear normal.      Left Ear: Hearing, tympanic membrane, ear canal and external ear normal.      Nose: Nose normal.      Right Sinus: No maxillary sinus tenderness or frontal sinus tenderness.      Left Sinus: No maxillary sinus tenderness or frontal sinus tenderness.       Mouth/Throat:      Mouth: Mucous membranes are moist.      Pharynx: Oropharynx is clear. Uvula midline.   Eyes:      Conjunctiva/sclera: Conjunctivae normal.   Neck:      Musculoskeletal: Normal range of motion.   Cardiovascular:      Rate and Rhythm: Normal rate and regular rhythm.      Heart sounds: Normal heart sounds.   Pulmonary:      Effort: Pulmonary effort is normal.      Breath sounds: Normal breath sounds.   Musculoskeletal: Normal range of motion.         General: Tenderness present.      Lumbar back: He exhibits tenderness.        Back:    Skin:     General: Skin is warm and dry.   Neurological:      Mental Status: He is alert and oriented to person, place, and time.   Psychiatric:         Mood and Affect: Mood normal.         Behavior: Behavior normal.         Thought Content: Thought content normal.         Judgment: Judgment normal.  Assessment and Plan:       Grasyn was seen today for back pain.    Diagnoses and all orders for this visit:    Dysuria  -     VH POCT UA AUTO  -     POCT UCC CBC  -     POCT I-STAT CHEM8    Acute bilateral low back pain without sciatica  -     POCT UCC CBC  -     POCT I-STAT CHEM8      Results     Procedure Component Value Units Date/Time    POCT UCC CBC [[id number]]  (Abnormal) Collected: 10/20/19 1027     Updated: 10/20/19 1044     VH UCC WBC, POCT 6.1 x 10^9/L      VH UCC % LYMPH, POCT 12.7 %      VH UCC % MONO, POCT 14.5 %      VH UCC % GR, POCT 72.8 %      VH UCC # LYMPH, POCT 0.7 x 10^9/L      VH UCC # MONO, POCT 1.0 x 10^9/L      VH UCC # GR, POCT 4.4 x 10^9/L      VH UCC RBC, POCT 5.49 x 10^12/L      VH UCC HGB, POCT 15.2 g/dL      VH UCC HCT, POCT 82.9 %      VH UCC MCV, POCT 83.8 fL      VH UCC MCH, POCT 27.8 pg      VH UCC MCHC, POCT 33.1 g/dL      VH UCC RDW, POCT 56.2 %      VH UCC PLT, POCT 217 x 10^9/L      VH UCC MPV, POCT 7.5 fL     POCT I-STAT CHEM8 [[id number]]  (Normal) Collected: 10/20/19 1027     Updated: 10/20/19 1037     Sodium, VH POCT 137  mmol/L      Potassium, VH POCT 3.8 mmol/L      Chloride, VH POCT 103 mmol/L      Ionized Calcium, VH POCT 4.7 mg/dL      Total Co2, VH POCT 24 mmol/L      Glucose, VH POCT 99 mg/dL      BUN, VH POCT 11 mg/dL      Creatinine, VH POCT 0.9 mg/dL      Anion Gap, VH POCT 14 mmol/L     VH POCT UA AUTO [[id number]]  (Normal) Collected: 10/20/19 1001    Specimen: Urine Updated: 10/20/19 1008     Urine Color POCT Yellow     Urine Clarity POCT Clear     Glucose, UA POCT Negative     Bilirubin, UA POCT Negative     Ketones, UA POCT Negative mg/dL      Specific Gravity, UA POCT 1.020 mg/dL      Blood, UA POCT  Negative     PH, UA POCT 6.5     Protein, UA POCT Negative mg/dL      Urobilinogen, UA POCT 0.2 mg/dL      Nitrite, UA POCT Negative     Urine Leukocytes POCT Negative        Given pt instruction on lower back strain protocol   OTC NSAID use and heating pad at this time  F/u with pcp/ RTC if new or worsening symptoms  Pt agrees with plan at time of d/c  Yetta Glassman, FNP  [facility]  10/20/2019  10:27 AM

## 2019-10-22 ENCOUNTER — Telehealth (INDEPENDENT_AMBULATORY_CARE_PROVIDER_SITE_OTHER): Payer: Self-pay

## 2019-10-22 NOTE — Telephone Encounter (Addendum)
He is feeling better.  ----- Message from Orland Jarred, [place] sent at 10/22/2019  8:34 AM EST -----  Please let patient know results. If having urinary sxs, should rtc/follow up with PCP to give another sample.

## 2019-10-23 ENCOUNTER — Ambulatory Visit (INDEPENDENT_AMBULATORY_CARE_PROVIDER_SITE_OTHER): Payer: Commercial Managed Care - POS

## 2019-10-23 ENCOUNTER — Other Ambulatory Visit
Admission: RE | Admit: 2019-10-23 | Discharge: 2019-10-23 | Disposition: A | Discharge status: Home / Self Care | Payer: Commercial Managed Care - POS | Source: Ambulatory Visit | Attending: Internal Medicine | Admitting: Internal Medicine

## 2019-10-23 DIAGNOSIS — Z03818 Encounter for observation for suspected exposure to other biological agents ruled out: Secondary | ICD-10-CM

## 2019-10-25 LAB — VH SARS-COV-2 ASSAY (PERKINELMER SYSTEM(TM))
Does patient reside in a congregate care setting?: NEGATIVE
Is patient admitted to the intensive care unit?: NEGATIVE
Is patient employed in a healthcare setting?: NEGATIVE
Is the patient hospitalized because of this condition?: NEGATIVE
Is the patient pregnant?: NEGATIVE
SARS-CoV-2 Assay (PerkinElmer System (TM)): DETECTED — CR

## 2019-10-26 ENCOUNTER — Other Ambulatory Visit: Payer: Self-pay | Admitting: Family

## 2019-10-26 DIAGNOSIS — U071 COVID-19: Secondary | ICD-10-CM

## 2019-11-11 ENCOUNTER — Encounter: Payer: Managed Care, Other (non HMO) | Admitting: Family Medicine

## 2019-11-29 ENCOUNTER — Telehealth: Payer: Self-pay | Admitting: Family Medicine

## 2019-11-29 ENCOUNTER — Other Ambulatory Visit: Payer: Self-pay | Admitting: Family Medicine

## 2019-11-29 DIAGNOSIS — E1169 Type 2 diabetes mellitus with other specified complication: Secondary | ICD-10-CM

## 2019-11-29 DIAGNOSIS — E1159 Type 2 diabetes mellitus with other circulatory complications: Secondary | ICD-10-CM

## 2019-11-29 DIAGNOSIS — E669 Obesity, unspecified: Secondary | ICD-10-CM

## 2019-11-29 DIAGNOSIS — I152 Hypertension secondary to endocrine disorders: Secondary | ICD-10-CM

## 2019-11-29 NOTE — Progress Notes (Signed)
Return for CPE near very future with full fasting blood work, re-check labs 3-4 mo after lifestyle changes.

## 2019-11-29 NOTE — Telephone Encounter (Signed)
Pt called to confirm April 29 CPE & wondered if he needed to fast for labs ?  --- Forwarding message to med asst that there are no lab order in pt's chart.( pt review ).  --glh

## 2019-11-29 NOTE — Telephone Encounter (Signed)
Yes, patient will need fasting labs same day as CPE. AS, CMA

## 2019-12-03 ENCOUNTER — Other Ambulatory Visit: Payer: Managed Care, Other (non HMO)

## 2020-01-02 ENCOUNTER — Other Ambulatory Visit: Payer: Managed Care, Other (non HMO)

## 2020-01-02 ENCOUNTER — Other Ambulatory Visit: Payer: Self-pay

## 2020-01-02 ENCOUNTER — Encounter: Payer: Self-pay | Admitting: Family Medicine

## 2020-01-02 ENCOUNTER — Ambulatory Visit (INDEPENDENT_AMBULATORY_CARE_PROVIDER_SITE_OTHER): Payer: Managed Care, Other (non HMO) | Admitting: Family Medicine

## 2020-01-02 VITALS — BP 144/71 | HR 79 | Temp 97.6°F | Ht 72.0 in | Wt 247.6 lb

## 2020-01-02 DIAGNOSIS — F430 Acute stress reaction: Secondary | ICD-10-CM | POA: Diagnosis not present

## 2020-01-02 DIAGNOSIS — Z0000 Encounter for general adult medical examination without abnormal findings: Secondary | ICD-10-CM

## 2020-01-02 DIAGNOSIS — Z719 Counseling, unspecified: Secondary | ICD-10-CM | POA: Diagnosis not present

## 2020-01-02 DIAGNOSIS — Z23 Encounter for immunization: Secondary | ICD-10-CM

## 2020-01-02 DIAGNOSIS — Z1283 Encounter for screening for malignant neoplasm of skin: Secondary | ICD-10-CM

## 2020-01-02 DIAGNOSIS — E669 Obesity, unspecified: Secondary | ICD-10-CM

## 2020-01-02 DIAGNOSIS — E1159 Type 2 diabetes mellitus with other circulatory complications: Secondary | ICD-10-CM

## 2020-01-02 DIAGNOSIS — E1169 Type 2 diabetes mellitus with other specified complication: Secondary | ICD-10-CM | POA: Diagnosis not present

## 2020-01-02 DIAGNOSIS — I152 Hypertension secondary to endocrine disorders: Secondary | ICD-10-CM

## 2020-01-02 DIAGNOSIS — I10 Essential (primary) hypertension: Secondary | ICD-10-CM

## 2020-01-02 LAB — POCT UA - MICROALBUMIN
Albumin/Creatinine Ratio, Urine, POC: <30
Creatinine, POC: 300 mg/dL
Microalbumin Ur, POC: 30 mg/L

## 2020-01-02 MED ORDER — LOSARTAN POTASSIUM 100 MG PO TABS: 100.0000 mg | ORAL_TABLET | Freq: Every day | ORAL | 0 refills | Status: DC

## 2020-01-02 MED ORDER — FLUOXETINE HCL 20 MG PO CAPS: 20.0000 mg | ORAL_CAPSULE | Freq: Every day | ORAL | 0 refills | Status: DC

## 2020-01-02 NOTE — Patient Instructions (Signed)
Preventive Care, Male Preventive care refers to lifestyle choices and visits with your health care provider that can promote health and wellness. What does preventive care include?   A yearly physical exam. This is also called an annual well check.  Dental exams once or twice a year.  Routine eye exams. Ask your health care provider how often you should have your eyes checked.  Personal lifestyle choices, including: ? Daily care of your teeth and gums. ? Regular physical activity. ? Eating a healthy diet. ? Avoiding tobacco and drug use. ? Limiting alcohol use. ? Practicing safe sex. ? Taking low doses of aspirin every day. ? Taking vitamin and mineral supplements as recommended by your health care provider. What happens during an annual well check? The services and screenings done by your health care provider during your annual well check will depend on your age, overall health, lifestyle risk factors, and family history of disease. Counseling Your health care provider may ask you questions about your:  Alcohol use.  Tobacco use.  Drug use.  Emotional well-being.  Home and relationship well-being.  Sexual activity.  Eating habits.  History of falls.  Memory and ability to understand (cognition).  Work and work Statistician. Screening You may have the following tests or measurements:  Height, weight, and BMI.  Blood pressure.  Lipid and cholesterol levels. These may be checked every 5 years, or more frequently if you are over 40 years old.  Skin check.  Lung cancer screening. You may have this screening every year starting at age 42 if you have a 30-pack-year history of smoking and currently smoke or have quit within the past 15 years.  Colorectal cancer screening. All adults should have this screening starting at age 39 and continuing until age 27. You will have tests every 1-10 years, depending on your results and the type of screening test. People at  increased risk should start screening at an earlier age. Screening tests may include: ? Guaiac-based fecal occult blood testing. ? Fecal immunochemical test (FIT). ? Stool DNA test. ? Virtual colonoscopy. ? Sigmoidoscopy. During this test, a flexible tube with a tiny camera (sigmoidoscope) is used to examine your rectum and lower colon. The sigmoidoscope is inserted through your anus into your rectum and lower colon. ? Colonoscopy. During this test, a long, thin, flexible tube with a tiny camera (colonoscope) is used to examine your entire colon and rectum.  Prostate cancer screening. Recommendations will vary depending on your family history and other risks.  Hepatitis C blood test.  Hepatitis B blood test.  Sexually transmitted disease (STD) testing.  Diabetes screening. This is done by checking your blood sugar (glucose) after you have not eaten for a while (fasting). You may have this done every 1-3 years.  Abdominal aortic aneurysm (AAA) screening. You may need this if you are a current or former smoker.  Osteoporosis. You may be screened starting at age 18 if you are at high risk. Talk with your health care provider about your test results, treatment options, and if necessary, the need for more tests. Vaccines Your health care provider may recommend certain vaccines, such as:  Influenza vaccine. This is recommended every year.  Tetanus, diphtheria, and acellular pertussis (Tdap, Td) vaccine. You may need a Td booster every 10 years.  Varicella vaccine. You may need this if you have not been vaccinated.  Zoster vaccine. You may need this after age 78.  Measles, mumps, and rubella (MMR) vaccine. You may need  at least one dose of MMR if you were born in 1957 or later. You may also need a second dose. °· Pneumococcal 13-valent conjugate (PCV13) vaccine. One dose is recommended after age 65. °· Pneumococcal polysaccharide (PPSV23) vaccine. One dose is recommended after age  65. °· Meningococcal vaccine. You may need this if you have certain conditions. °· Hepatitis A vaccine. You may need this if you have certain conditions or if you travel or work in places where you may be exposed to hepatitis A. °· Hepatitis B vaccine. You may need this if you have certain conditions or if you travel or work in places where you may be exposed to hepatitis B. °· Haemophilus influenzae type b (Hib) vaccine. You may need this if you have certain risk factors. °Talk to your health care provider about which screenings and vaccines you need and how often you need them. °This information is not intended to replace advice given to you by your health care provider. Make sure you discuss any questions you have with your health care provider. °Document Released: 09/18/2015 Document Revised: 10/12/2017 Document Reviewed: 06/23/2015 °Elsevier Interactive Patient Education © 2019 [employer] ° ° ° ° ° ° ° °Preventive Care for Adults, Male °A healthy lifestyle and preventive care can promote health and wellness. Preventive health guidelines for men include the following key practices: °· A routine yearly physical is a good way to check with your health care provider about your health and preventative screening. It is a chance to share any concerns and updates on your health and to receive a thorough exam. °· Visit your dentist for a routine exam and preventative care every 6 months. Brush your teeth twice a day and floss once a day. Good oral hygiene prevents tooth decay and gum disease. °· The frequency of eye exams is based on your age, health, family medical history, use of contact lenses, and other factors. Follow your health care provider's recommendations for frequency of eye exams. °· Eat a healthy diet. Foods such as vegetables, fruits, whole grains, low-fat dairy products, and lean protein foods contain the nutrients you need without too many calories. Decrease your intake of foods high in solid fats,  added sugars, and salt. Eat the right amount of calories for you. Get information about a proper diet from your health care provider, if necessary. °· Regular physical exercise is one of the most important things you can do for your health. Most adults should get at least 150 minutes of moderate-intensity exercise (any activity that increases your heart rate and causes you to sweat) each week. In addition, most adults need muscle-strengthening exercises on 2 or more days a week. °· Maintain a healthy weight. The body mass index (BMI) is a screening tool to identify possible weight problems. It provides an estimate of body fat based on height and weight. Your health care provider can find your BMI and can help you achieve or maintain a healthy weight. For adults 20 years and older: °¨ A BMI below 18.5 is considered underweight. °¨ A BMI of 18.5 to 24.9 is normal. °¨ A BMI of 25 to 29.9 is considered overweight. °¨ A BMI of 30 and above is considered obese. °· Maintain normal blood lipids and cholesterol levels by exercising and minimizing your intake of saturated fat. Eat a balanced diet with plenty of fruit and vegetables. Blood tests for lipids and cholesterol should begin at age 20 and be repeated every 5 years. If your lipid or cholesterol   levels are high, you are over 50, or you are at high risk for heart disease, you may need your cholesterol levels checked more frequently. Ongoing high lipid and cholesterol levels should be treated with medicines if diet and exercise are not working.  If you smoke, find out from your health care provider how to quit. If you do not use tobacco, do not start.  Lung cancer screening is recommended for adults aged 71-80 years who are at high risk for developing lung cancer because of a history of smoking. A yearly low-dose CT scan of the lungs is recommended for people who have at least a 30-pack-year history of smoking and are a current smoker or have quit within the past 15  years. A pack year of smoking is smoking an average of 1 pack of cigarettes a day for 1 year (for example: 1 pack a day for 30 years or 2 packs a day for 15 years). Yearly screening should continue until the smoker has stopped smoking for at least 15 years. Yearly screening should be stopped for people who develop a health problem that would prevent them from having lung cancer treatment.  If you choose to drink alcohol, do not have more than 2 drinks per day. One drink is considered to be 12 ounces (355 mL) of beer, 5 ounces (148 mL) of wine, or 1.5 ounces (44 mL) of liquor.  Avoid use of street drugs. Do not share needles with anyone. Ask for help if you need support or instructions about stopping the use of drugs.  High blood pressure causes heart disease and increases the risk of stroke. Your blood pressure should be checked at least every 1-2 years. Ongoing high blood pressure should be treated with medicines, if weight loss and exercise are not effective.  If you are 36-60 years old, ask your health care provider if you should take aspirin to prevent heart disease.  Diabetes screening is done by taking a blood sample to check your blood glucose level after you have not eaten for a certain period of time (fasting). If you are not overweight and you do not have risk factors for diabetes, you should be screened once every 3 years starting at age 42. If you are overweight or obese and you are 20-59 years of age, you should be screened for diabetes every year as part of your cardiovascular risk assessment.  Colorectal cancer can be detected and often prevented. Most routine colorectal cancer screening begins at the age of 80 and continues through age 41. However, your health care provider may recommend screening at an earlier age if you have risk factors for colon cancer. On a yearly basis, your health care provider may provide home test kits to check for hidden blood in the stool. Use of a small camera  at the end of a tube to directly examine the colon (sigmoidoscopy or colonoscopy) can detect the earliest forms of colorectal cancer. Talk to your health care provider about this at age 18, when routine screening begins. Direct exam of the colon should be repeated every 5-10 years through age 86, unless early forms of precancerous polyps or small growths are found.  People who are at an increased risk for hepatitis B should be screened for this virus. You are considered at high risk for hepatitis B if:  You were born in a country where hepatitis B occurs often. Talk with your health care provider about which countries are considered high risk.  Your parents  were born in a high-risk country and you have not received a shot to protect against hepatitis B (hepatitis B vaccine).  You have HIV or AIDS.  You use needles to inject street drugs.  You live with, or have sex with, someone who has hepatitis B.  You are a man who has sex with other men (MSM).  You get hemodialysis treatment.  You take certain medicines for conditions such as cancer, organ transplantation, and autoimmune conditions.  Hepatitis C blood testing is recommended for all people born from [birth year] through 1965 and any individual with known risks for hepatitis C.  Practice safe sex. Use condoms and avoid high-risk sexual practices to reduce the spread of sexually transmitted infections (STIs). STIs include gonorrhea, chlamydia, syphilis, trichomonas, herpes, HPV, and human immunodeficiency virus (HIV). Herpes, HIV, and HPV are viral illnesses that have no cure. They can result in disability, cancer, and death.  If you are a man who has sex with other men, you should be screened at least once per year for:  HIV.  Urethral, rectal, and pharyngeal infection of gonorrhea, chlamydia, or both.  If you are at risk of being infected with HIV, it is recommended that you take a prescription medicine daily to prevent HIV infection. This  is called preexposure prophylaxis (PrEP). You are considered at risk if:  You are a man who has sex with other men (MSM) and have other risk factors.  You are a heterosexual man, are sexually active, and are at increased risk for HIV infection.  You take drugs by injection.  You are sexually active with a partner who has HIV.  Talk with your health care provider about whether you are at high risk of being infected with HIV. If you choose to begin PrEP, you should first be tested for HIV. You should then be tested every 3 months for as long as you are taking PrEP.  A one-time screening for abdominal aortic aneurysm (AAA) and surgical repair of large AAAs by ultrasound are recommended for men ages 42 to 30 years who are current or former smokers.  Healthy men should no longer receive prostate-specific antigen (PSA) blood tests as part of routine cancer screening. Talk with your health care provider about prostate cancer screening.  Testicular cancer screening is not recommended for adult males who have no symptoms. Screening includes self-exam, a health care provider exam, and other screening tests. Consult with your health care provider about any symptoms you have or any concerns you have about testicular cancer.  Use sunscreen. Apply sunscreen liberally and repeatedly throughout the day. You should seek shade when your shadow is shorter than you. Protect yourself by wearing long sleeves, pants, a wide-brimmed hat, and sunglasses year round, whenever you are outdoors.  Once a month, do a whole-body skin exam, using a mirror to look at the skin on your back. Tell your health care provider about new moles, moles that have irregular borders, moles that are larger than a pencil eraser, or moles that have changed in shape or color.  Stay current with required vaccines (immunizations).  Influenza vaccine. All adults should be immunized every year.  Tetanus, diphtheria, and acellular pertussis (Td,  Tdap) vaccine. An adult who has not previously received Tdap or who does not know his vaccine status should receive 1 dose of Tdap. This initial dose should be followed by tetanus and diphtheria toxoids (Td) booster doses every 10 years. Adults with an unknown or incomplete history of completing a 3-dose  immunization series with Td-containing vaccines should begin or complete a primary immunization series including a Tdap dose. Adults should receive a Td booster every 10 years.  Varicella vaccine. An adult without evidence of immunity to varicella should receive 2 doses or a second dose if he has previously received 1 dose.  Human papillomavirus (HPV) vaccine. Males aged 11-21 years who have not received the vaccine previously should receive the 3-dose series. Males aged 22-26 years may be immunized. Immunization is recommended through the age of 25 years for any male who has sex with males and did not get any or all doses earlier. Immunization is recommended for any person with an immunocompromised condition through the age of 50 years if he did not get any or all doses earlier. During the 3-dose series, the second dose should be obtained 4-8 weeks after the first dose. The third dose should be obtained 24 weeks after the first dose and 16 weeks after the second dose.  Zoster vaccine. One dose is recommended for adults aged 86 years or older unless certain conditions are present.  Measles, mumps, and rubella (MMR) vaccine. Adults born before [birth year] generally are considered immune to measles and mumps. Adults born in [birth year] or later should have 1 or more doses of MMR vaccine unless there is a contraindication to the vaccine or there is laboratory evidence of immunity to each of the three diseases. A routine second dose of MMR vaccine should be obtained at least 28 days after the first dose for students attending postsecondary schools, health care workers, or international travelers. People who received  inactivated measles vaccine or an unknown type of measles vaccine during 1963-1967 should receive 2 doses of MMR vaccine. People who received inactivated mumps vaccine or an unknown type of mumps vaccine before 1979 and are at high risk for mumps infection should consider immunization with 2 doses of MMR vaccine. Unvaccinated health care workers born before [birth year] who lack laboratory evidence of measles, mumps, or rubella immunity or laboratory confirmation of disease should consider measles and mumps immunization with 2 doses of MMR vaccine or rubella immunization with 1 dose of MMR vaccine.  Pneumococcal 13-valent conjugate (PCV13) vaccine. When indicated, a person who is uncertain of his immunization history and has no record of immunization should receive the PCV13 vaccine. All adults 62 years of age and older should receive this vaccine. An adult aged 77 years or older who has certain medical conditions and has not been previously immunized should receive 1 dose of PCV13 vaccine. This PCV13 should be followed with a dose of pneumococcal polysaccharide (PPSV23) vaccine. Adults who are at high risk for pneumococcal disease should obtain the PPSV23 vaccine at least 8 weeks after the dose of PCV13 vaccine. Adults older than 61 years of age who have normal immune system function should obtain the PPSV23 vaccine dose at least 1 year after the dose of PCV13 vaccine.  Pneumococcal polysaccharide (PPSV23) vaccine. When PCV13 is also indicated, PCV13 should be obtained first. All adults aged 40 years and older should be immunized. An adult younger than age 62 years who has certain medical conditions should be immunized. Any person who resides in a nursing home or long-term care facility should be immunized. An adult smoker should be immunized. People with an immunocompromised condition and certain other conditions should receive both PCV13 and PPSV23 vaccines. People with human immunodeficiency virus (HIV) infection  should be immunized as soon as possible after diagnosis. Immunization during chemotherapy or radiation therapy should  be avoided. Routine use of PPSV23 vaccine is not recommended for American Indians, [place] Natives, or people younger than 65 years unless there are medical conditions that require PPSV23 vaccine. When indicated, people who have unknown immunization and have no record of immunization should receive PPSV23 vaccine. One-time revaccination 5 years after the first dose of PPSV23 is recommended for people aged 19-64 years who have chronic kidney failure, nephrotic syndrome, asplenia, or immunocompromised conditions. People who received 1-2 doses of PPSV23 before age 66 years should receive another dose of PPSV23 vaccine at age 62 years or later if at least 5 years have passed since the previous dose. Doses of PPSV23 are not needed for people immunized with PPSV23 at or after age 31 years.  Meningococcal vaccine. Adults with asplenia or persistent complement component deficiencies should receive 2 doses of quadrivalent meningococcal conjugate (MenACWY-D) vaccine. The doses should be obtained at least 2 months apart. Microbiologists working with certain meningococcal bacteria, [place] recruits, people at risk during an outbreak, and people who travel to or live in countries with a high rate of meningitis should be immunized. A first-year college student up through age 80 years who is living in a residence hall should receive a dose if he did not receive a dose on or after his 16th birthday. Adults who have certain high-risk conditions should receive one or more doses of vaccine.  Hepatitis A vaccine. Adults who wish to be protected from this disease, have chronic liver disease, work with hepatitis A-infected animals, work in hepatitis A research labs, or travel to or work in countries with a high rate of hepatitis A should be immunized. Adults who were previously unvaccinated and who anticipate close  contact with an international adoptee during the first 60 days after arrival in the [place] from a country with a high rate of hepatitis A should be immunized.  Hepatitis B vaccine. Adults should be immunized if they wish to be protected from this disease, are under age 89 years and have diabetes, have chronic liver disease, have had more than one sex partner in the past 6 months, may be exposed to blood or other infectious body fluids, are household contacts or sex partners of hepatitis B positive people, are clients or workers in certain care facilities, or travel to or work in countries with a high rate of hepatitis B.  Haemophilus influenzae type b (Hib) vaccine. A previously unvaccinated person with asplenia or sickle cell disease or having a scheduled splenectomy should receive 1 dose of Hib vaccine. Regardless of previous immunization, a recipient of a hematopoietic stem cell transplant should receive a 3-dose series 6-12 months after his successful transplant. Hib vaccine is not recommended for adults with HIV infection. Preventive Service / Frequency Ages 42 to 29  Blood pressure check.** / Every 3-5 years.  Lipid and cholesterol check.** / Every 5 years beginning at age 64.  Hepatitis C blood test.** / For any individual with known risks for hepatitis C.  Skin self-exam. / Monthly.  Influenza vaccine. / Every year.  Tetanus, diphtheria, and acellular pertussis (Tdap, Td) vaccine.** / Consult your health care provider. 1 dose of Td every 10 years.  Varicella vaccine.** / Consult your health care provider.  HPV vaccine. / 3 doses over 6 months, if 57 or younger.  Measles, mumps, rubella (MMR) vaccine.** / You need at least 1 dose of MMR if you were born in [birth year] or later. You may also need a second dose.  Pneumococcal 13-valent conjugate (  PCV13) vaccine.** / Consult your health care provider.  Pneumococcal polysaccharide (PPSV23) vaccine.** / 1 to 2 doses if you smoke  cigarettes or if you have certain conditions.  Meningococcal vaccine.** / 1 dose if you are age 51 to 42 years and a Market researcher living in a residence hall, or have one of several medical conditions. You may also need additional booster doses.  Hepatitis A vaccine.** / Consult your health care provider.  Hepatitis B vaccine.** / Consult your health care provider.  Haemophilus influenzae type b (Hib) vaccine.** / Consult your health care provider. Ages 30 to 76  Blood pressure check.** / Every year.  Lipid and cholesterol check.** / Every 5 years beginning at age 38.  Lung cancer screening. / Every year if you are aged 92-80 years and have a 30-pack-year history of smoking and currently smoke or have quit within the past 15 years. Yearly screening is stopped once you have quit smoking for at least 15 years or develop a health problem that would prevent you from having lung cancer treatment.  Fecal occult blood test (FOBT) of stool. / Every year beginning at age 24 and continuing until age 51. You may not have to do this test if you get a colonoscopy every 10 years.  Flexible sigmoidoscopy** or colonoscopy.** / Every 5 years for a flexible sigmoidoscopy or every 10 years for a colonoscopy beginning at age 76 and continuing until age 32.  Hepatitis C blood test.** / For all people born from [birth year] through 1965 and any individual with known risks for hepatitis C.  Skin self-exam. / Monthly.  Influenza vaccine. / Every year.  Tetanus, diphtheria, and acellular pertussis (Tdap/Td) vaccine.** / Consult your health care provider. 1 dose of Td every 10 years.  Varicella vaccine.** / Consult your health care provider.  Zoster vaccine.** / 1 dose for adults aged 55 years or older.  Measles, mumps, rubella (MMR) vaccine.** / You need at least 1 dose of MMR if you were born in 1957 or later. You may also need a second dose.  Pneumococcal 13-valent conjugate (PCV13) vaccine.** /  Consult your health care provider.  Pneumococcal polysaccharide (PPSV23) vaccine.** / 1 to 2 doses if you smoke cigarettes or if you have certain conditions.  Meningococcal vaccine.** / Consult your health care provider.  Hepatitis A vaccine.** / Consult your health care provider.  Hepatitis B vaccine.** / Consult your health care provider.  Haemophilus influenzae type b (Hib) vaccine.** / Consult your health care provider. Ages 75 and over  Blood pressure check.** / Every year.  Lipid and cholesterol check.**/ Every 5 years beginning at age 62.  Lung cancer screening. / Every year if you are aged 79-80 years and have a 30-pack-year history of smoking and currently smoke or have quit within the past 15 years. Yearly screening is stopped once you have quit smoking for at least 15 years or develop a health problem that would prevent you from having lung cancer treatment.  Fecal occult blood test (FOBT) of stool. / Every year beginning at age 59 and continuing until age 75. You may not have to do this test if you get a colonoscopy every 10 years.  Flexible sigmoidoscopy** or colonoscopy.** / Every 5 years for a flexible sigmoidoscopy or every 10 years for a colonoscopy beginning at age 26 and continuing until age 30.  Hepatitis C blood test.** / For all people born from [birth year] through 1967 and any individual with known risks for hepatitis C.  Abdominal aortic aneurysm (AAA) screening.** / A one-time screening for ages 73 to 4 years who are current or former smokers.  Skin self-exam. / Monthly.  Influenza vaccine. / Every year.  Tetanus, diphtheria, and acellular pertussis (Tdap/Td) vaccine.** / 1 dose of Td every 10 years.  Varicella vaccine.** / Consult your health care provider.  Zoster vaccine.** / 1 dose for adults aged 106 years or older.  Pneumococcal 13-valent conjugate (PCV13) vaccine.** / 1 dose for all adults aged 16 years and older.  Pneumococcal polysaccharide (PPSV23)  vaccine.** / 1 dose for all adults aged 29 years and older.  Meningococcal vaccine.** / Consult your health care provider.  Hepatitis A vaccine.** / Consult your health care provider.  Hepatitis B vaccine.** / Consult your health care provider.  Haemophilus influenzae type b (Hib) vaccine.** / Consult your health care provider. **Family history and personal history of risk and conditions may change your health care provider's recommendations.   This information is not intended to replace advice given to you by your health care provider. Make sure you discuss any questions you have with your health care provider.   Document Released: 10/18/2001 Document Revised: 09/12/2014 Document Reviewed: 01/17/2011 Elsevier Interactive Patient Education [employer].
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
## 2020-01-03 LAB — LIPID PANEL
Chol/HDL Ratio: 2.4 ratio (ref 0.0–5.0)
Cholesterol, Total: 111 mg/dL (ref 100–199)
HDL: 46 mg/dL (ref >39)
LDL Chol Calc (NIH): 49 mg/dL (ref 0–99)
Triglycerides: 76 mg/dL (ref 0–149)
VLDL Cholesterol Cal: 16 mg/dL (ref 5–40)

## 2020-01-03 LAB — COMPREHENSIVE METABOLIC PANEL
ALT: 30 IU/L (ref 0–44)
AST: 24 IU/L (ref 0–40)
Albumin/Globulin Ratio: 1.7 (ref 1.2–2.2)
Albumin: 4.3 g/dL (ref 3.8–4.9)
Alkaline Phosphatase: 58 IU/L (ref 39–117)
BUN/Creatinine Ratio: 19 (ref 10–24)
BUN: 16 mg/dL (ref 8–27)
Bilirubin Total: 0.3 mg/dL (ref 0.0–1.2)
CO2: 23 mmol/L (ref 20–29)
Calcium: 9.1 mg/dL (ref 8.6–10.2)
Chloride: 104 mmol/L (ref 96–106)
Creatinine, Ser: 0.83 mg/dL (ref 0.76–1.27)
GFR calc Af Amer: 111 mL/min/{1.73_m2} (ref >59)
GFR calc non Af Amer: 96 mL/min/{1.73_m2} (ref >59)
Globulin, Total: 2.5 g/dL (ref 1.5–4.5)
Glucose: 118 mg/dL — ABNORMAL HIGH (ref 65–99)
Potassium: 4.5 mmol/L (ref 3.5–5.2)
Sodium: 140 mmol/L (ref 134–144)
Total Protein: 6.8 g/dL (ref 6.0–8.5)

## 2020-01-03 LAB — CBC
Hematocrit: 46.3 % (ref 37.5–51.0)
Hemoglobin: 16.4 g/dL (ref 13.0–17.7)
MCH: 32.3 pg (ref 26.6–33.0)
MCHC: 35.4 g/dL (ref 31.5–35.7)
MCV: 91 fL (ref 79–97)
Platelets: 229 10*3/uL (ref 150–450)
RBC: 5.07 x10E6/uL (ref 4.14–5.80)
RDW: 12.7 % (ref 11.6–15.4)
WBC: 7.3 10*3/uL (ref 3.4–10.8)

## 2020-01-03 LAB — T4, FREE: Free T4: 0.86 ng/dL (ref 0.82–1.77)

## 2020-01-03 LAB — VITAMIN D 25 HYDROXY (VIT D DEFICIENCY, FRACTURES): Vit D, 25-Hydroxy: 44.3 ng/mL (ref 30.0–100.0)

## 2020-01-03 LAB — TSH: TSH: 4.26 u[IU]/mL (ref 0.450–4.500)

## 2020-01-03 LAB — HEMOGLOBIN A1C
Est. average glucose Bld gHb Est-mCnc: 128 mg/dL
Hgb A1c MFr Bld: 6.1 % — ABNORMAL HIGH (ref 4.8–5.6)

## 2020-02-05 ENCOUNTER — Emergency Department
Admission: EM | Admit: 2020-02-05 | Discharge: 2020-02-05 | Disposition: A | Discharge status: Home / Self Care | Payer: Commercial Managed Care - POS | Attending: Emergency Medicine | Admitting: Emergency Medicine

## 2020-02-05 ENCOUNTER — Emergency Department: Payer: Commercial Managed Care - POS

## 2020-02-05 DIAGNOSIS — R109 Unspecified abdominal pain: Secondary | ICD-10-CM

## 2020-02-05 DIAGNOSIS — R1032 Left lower quadrant pain: Secondary | ICD-10-CM | POA: Insufficient documentation

## 2020-02-05 DIAGNOSIS — N200 Calculus of kidney: Secondary | ICD-10-CM | POA: Insufficient documentation

## 2020-02-05 LAB — BASIC METABOLIC PANEL
Anion Gap: 13.7 mMol/L (ref 7.0–18.0)
BUN / Creatinine Ratio: 17.6 Ratio (ref 10.0–30.0)
BUN: 18 mg/dL (ref 7–22)
CO2: 21 mMol/L (ref 20–30)
Calcium: 9.2 mg/dL (ref 8.5–10.5)
Chloride: 108 mMol/L (ref 98–110)
Creatinine: 1.02 mg/dL (ref 0.80–1.30)
EGFR: 80 mL/min/{1.73_m2} (ref 60–150)
Glucose: 183 mg/dL — ABNORMAL HIGH (ref 71–99)
Osmolality Calculated: 284 mOsm/kg (ref 275–300)
Potassium: 3.7 mMol/L (ref 3.5–5.3)
Sodium: 139 mMol/L (ref 136–147)

## 2020-02-05 LAB — VH URINALYSIS WITH MICROSCOPIC
Bilirubin, UA: NEGATIVE
Glucose, UA: >=500 mg/dL — AB
Ketones UA: 5 mg/dL
Leukocyte Esterase, UA: NEGATIVE Leu/uL
Nitrite, UA: NEGATIVE
Protein, UR: 30 mg/dL — AB
RBC, UA: 68 /hpf — ABNORMAL HIGH (ref 0–4)
Squam Epithel, UA: 1 /hpf (ref 0–2)
Urine Specific Gravity: 1.028 (ref 1.001–1.040)
Urobilinogen, UA: NORMAL mg/dL
pH, Urine: 5 pH (ref 5.0–8.0)

## 2020-02-05 LAB — CBC AND DIFFERENTIAL
Basophils %: 0.3 % (ref 0.0–3.0)
Basophils Absolute: 0 10*3/uL (ref 0.0–0.3)
Eosinophils %: 0.3 % (ref 0.0–7.0)
Eosinophils Absolute: 0 10*3/uL (ref 0.0–0.8)
Hematocrit: 46.5 % (ref 39.0–52.5)
Hemoglobin: 14.7 gm/dL (ref 13.0–17.5)
Lymphocytes Absolute: 0.8 10*3/uL (ref 0.6–5.1)
Lymphocytes: 7.2 % — ABNORMAL LOW (ref 15.0–46.0)
MCH: 29 pg (ref 28–35)
MCHC: 32 gm/dL (ref 32–36)
MCV: 91 fL (ref 80–100)
MPV: 6.8 fL (ref 6.0–10.0)
Monocytes Absolute: 0.3 10*3/uL (ref 0.1–1.7)
Monocytes: 2.7 % — ABNORMAL LOW (ref 3.0–15.0)
Neutrophils %: 89.5 % — ABNORMAL HIGH (ref 42.0–78.0)
Neutrophils Absolute: 10.3 10*3/uL — ABNORMAL HIGH (ref 1.7–8.6)
PLT CT: 229 10*3/uL (ref 130–440)
RBC: 5.14 10*6/uL (ref 4.00–5.70)
RDW: 12.6 % (ref 11.0–14.0)
WBC: 11.5 10*3/uL — ABNORMAL HIGH (ref 4.0–11.0)

## 2020-02-05 MED ORDER — KETOROLAC TROMETHAMINE 30 MG/ML IJ SOLN
15.00 mg | Freq: Once | INTRAMUSCULAR | Status: AC
Start: 2020-02-05 — End: 2020-02-05
  Administered 2020-02-05: 14:00:00 15 mg via INTRAVENOUS
  Filled 2020-02-05: qty 1

## 2020-02-05 MED ORDER — ONDANSETRON 4 MG PO TBDP
ORAL_TABLET | ORAL | Status: AC
Start: 2020-02-05 — End: ?
  Filled 2020-02-05: qty 1

## 2020-02-05 MED ORDER — IBUPROFEN 600 MG PO TABS
600.0000 mg | ORAL_TABLET | Freq: Four times a day (QID) | ORAL | 0 refills | Status: DC | PRN
Start: 2020-02-05 — End: 2021-10-22

## 2020-02-05 MED ORDER — KETOROLAC TROMETHAMINE 30 MG/ML IJ SOLN
30.00 mg | Freq: Once | INTRAMUSCULAR | Status: AC
Start: 2020-02-05 — End: 2020-02-05
  Administered 2020-02-05: 08:00:00 30 mg via INTRAMUSCULAR

## 2020-02-05 MED ORDER — TAMSULOSIN HCL 0.4 MG PO CAPS
0.40 mg | ORAL_CAPSULE | Freq: Every day | ORAL | 0 refills | Status: AC
Start: 2020-02-05 — End: 2020-02-12

## 2020-02-05 MED ORDER — OXYCODONE HCL 5 MG PO TABS
ORAL_TABLET | ORAL | Status: AC
Start: 2020-02-05 — End: ?
  Filled 2020-02-05: qty 3

## 2020-02-05 MED ORDER — TAMSULOSIN HCL 0.4 MG PO CAPS
ORAL_CAPSULE | ORAL | Status: AC
Start: 2020-02-05 — End: ?
  Filled 2020-02-05: qty 1

## 2020-02-05 MED ORDER — KETOROLAC TROMETHAMINE 15 MG/ML IJ SOLN
INTRAMUSCULAR | Status: AC
Start: 2020-02-05 — End: ?
  Filled 2020-02-05: qty 1

## 2020-02-05 MED ORDER — ONDANSETRON 4 MG PO TBDP
8.00 mg | ORAL_TABLET | Freq: Once | ORAL | Status: AC
Start: 2020-02-05 — End: 2020-02-05
  Administered 2020-02-05: 08:00:00 8 mg via ORAL

## 2020-02-05 MED ORDER — KETOROLAC TROMETHAMINE 30 MG/ML IJ SOLN
INTRAMUSCULAR | Status: AC
Start: 2020-02-05 — End: ?
  Filled 2020-02-05: qty 1

## 2020-02-05 MED ORDER — TAMSULOSIN HCL 0.4 MG PO CAPS
0.40 mg | ORAL_CAPSULE | Freq: Every day | ORAL | Status: DC
Start: 2020-02-05 — End: 2020-02-05
  Administered 2020-02-05: 14:00:00 0.4 mg via ORAL
  Filled 2020-02-05: qty 1

## 2020-02-05 MED ORDER — OXYCODONE HCL 5 MG PO TABS
3.00 | ORAL_TABLET | Freq: Once | ORAL | Status: AC
Start: 2020-02-05 — End: 2020-02-05
  Administered 2020-02-05: 14:00:00 15 mg

## 2020-02-05 NOTE — Discharge Instructions (Signed)
Your vital signs were within normal limits and your physical exam was notable for left-sided flank pain.  Given your prior history of kidney stones and migration of the pain to your left lower quadrant, it is possible that your left flank pain is due to a kidney stone.  However, our CT scanner is not functional at this time and you have expressed a preference to go to [facility] to get a CT scan to better characterize the size of a possible kidney stone and to rule out other possible intra-abdominal abnormalities.  Return to ER if you develop any concerning symptoms.        Flank Pain with Uncertain Cause  The flank is the area between your upper belly (abdomen) and your back. Pain there is often caused by a problem with your kidneys. It might be a kidney infection or a kidney stone. Other causes of flank pain include spinal arthritis, a pinched nerve from a back injury, a rib injury, a bruise, a back muscle strain, inflammation, or spasm.  The cause of your flank pain is not certain. You may need other tests.  Home care  Follow these tips when caring for yourself at home:   You may use acetaminophen or ibuprofen to control pain, unless your healthcare provider prescribed another medicine. If you have chronic liver or kidney disease, talk with your provider before taking these medicines. Also talk with your provider first if youve ever had a stomach ulcer or digestive bleeding.   If the pain is coming from your muscles, you may get relief with ice or heat. During the first 2 days after the injury, put an ice pack on the painful area for 20 minutes every 2 to 4 hours. This will reduce swelling and pain. A hot shower, hot bath, or heating pad works well for a muscle spasm. You can start with ice, then switch to heat after 2 days. You might find that alternating ice and heat works well. Use the method that feels the best to you.  Follow-up care  Follow up with your healthcare provider if your  symptoms dont get better over the next few days.  When to seek medical advice  Call your healthcare provider right awayif any of these happen:   Repeated vomiting   Fever of 100.36F (38C) or higher, or as directed by your healthcare provider   Flank pain that gets worse   Pain that spreads to the front of your belly (abdomen)   Dizziness, weakness, or fainting   Blood in your urine   Burning feeling when you urinate or the need to urinate often   Pain in one of your legs that gets worse   Numbness or weakness in a leg  [employer] last reviewed this educational content on 06/05/2018   2000-2020 [employer], [place]. [address], [place], [place] [address]. All rights reserved. This information is not intended as a substitute for professional medical care. Always follow your healthcare professional's instructions.

## 2020-02-05 NOTE — ED Provider Notes (Signed)
[facility]  Emergency Department       Patient Name: Peter Ramos, Peter Ramos Patient DOB:  [birth year]   Encounter Date:  02/05/2020 Age: 59 y.o. male   Attending ED Physician: Dorie Rank, MD MRN:  [id number]   Room:  1409/ED1409-A PCP: Peter Halt, MD      Diagnosis / Disposition:   Final Impression  1. Acute left flank pain        Disposition              ED Disposition     ED Disposition Condition Date/Time Comment    Discharge  Wed Feb 05, 2020  8:14 AM Peter Ramos discharge to home/self care.    Condition at disposition: Stable          Follow up  No follow-up provider specified.    Prescriptions  Discharge Medication List as of 02/05/2020  8:14 AM            History of Presenting Illness:   Chief complaint: Flank Pain    HPI/ROS is limited by: none  HPI/ROS given by: patient    Location: L flank  Quality: Sharp, achy  Duration: 5 hours  Severity: severe    Nursing Notes reviewed and acknowledged.    Peter Ramos is a 59 y.o. male who presents with severe, acute onset, L sided flank pain which started ~0300. States pain has migrated somewhat to L lateral and LLQ regions. Reports h/o kidney stones in the past. Denies gross hematuria or urinary sxs. Reports nausea and nbnb emesis due to the pain. Denies f/c/d, cp, sob, focal weakness. Able to tolerate PO intake yesterday.     In addition to the above history, please see nursing notes. Allergies, meds, past medical, family, social hx, and the results of the diagnostic studies performed have been reviewed by myself.      Review of Systems   HEENT:  No ear pain. No congestion.  No discharge. No sore throat.  No difficulty swallowing.  Respiratory: No cough.  No shortness of breath.  Cardiovascular: No chest pain.  No palpitations.  GI: +Nausea, Vomiting; no Diarrhea, or GI bleeding  GU:  +L flank pain; No dysuria. No hematuria.   Neurological:  No headache.  No lateralizing weakness.  Musculoskeletal:  No unusual myalgias or  weakness  Skin:  No rash.  No skin lesions.  Endocrine:  No weight change, polyuria, or polydypsia  Psychiatric:  No depression.  No anxiety.  No SI/HI/AVH    All other systems reviewed and negative except as above, pertinent findings in HPI.        Allergies / Medications:   Pt has No Known Allergies.    Discharge Medication List as of 02/05/2020  8:14 AM      CONTINUE these medications which have NOT CHANGED    Details   Ascorbic Acid (vitamin C) 1000 MG tablet Take 1,000 mg by mouth daily, Historical Med      Garlic 10 MG Cap Take by mouth, Historical Med      omeprazole (PriLOSEC) 20 MG capsule Take 20 mg by mouth daily, Historical Med      vitamin D (CHOLECALCIFEROL) 25 MCG (1000 UT) tablet Take 1,000 Units by mouth daily, Historical Med      zinc gluconate 50 MG tablet Take 50 mg by mouth daily, Historical Med      albuterol (PROVENTIL HFA;VENTOLIN HFA) 108 (90 BASE) MCG/ACT inhaler Inhale 1-2 puffs into the lungs every  4 (four) hours as needed for Wheezing or Shortness of Breath (cough)., Starting 05/27/2013, Until Discontinued, Print      pantoprazole (PROTONIX) 20 MG tablet Take 20 mg by mouth daily., Until Discontinued, Historical Med               Past History:   Medical: Pt has a past medical history of GERD (gastroesophageal reflux disease) and OSA (obstructive sleep apnea).    Surgical: Pt  has no past surgical history on file.    Family: The family history includes Heart attack in his father and sister; Pulmonary embolism in an other family member. He was adopted.    Social: Pt reports that he has never smoked. He has never used smokeless tobacco. He reports current alcohol use. He reports that he does not use drugs.      Physical Exam:   Constitutional: Vital signs reviewed. Well appearing.  Head: Normocephalic, atraumatic  Eyes: PERRL. EOMI. Conjunctiva and sclera are normal.  No injection or discharge.  Ears, Nose, Throat:  Normal external examination of the nose and ears.  No tonsillar erythema or  exudates. Uvula midline.   Neck: Normal range of motion. Non-tender. No JVD  Respiratory/Chest: No respiratory distress. Breath sounds clear to auscultation  Cardiac: regular rate and rhythm, no murmur  Abdomen: bowel sounds normoactive, soft and nontender, no guarding/rebound/peritoneal signs  Back:  No paraspinous tenderness, +L CVA TTP   Extremities: no evidence of injury, no edema, positive pulses with good capillary refill  Neurological: No focal motor or sensory deficits by observation. Speech normal. Normal gait.  Skin: Warm and dry. No rash.  Psychiatric: Alert and conversant.  Normal affect.  Normal insight.        MDM:   58 y.o. male presents with severe, acute onset, L sided flank pain which started ~0300. +L CVA TTP. Pt appears clinically nontoxic. Abdomen soft, nontender. C/f renal colic vs UTI/pyelo vs diverticulitis vs MSK pain. Pt informed that CT scanner at [facility] currently not functional to be able to determine size of stone and r/o other intra-abdominal pathologies. Pt expressed preference medications for pain and nausea only and stated he will drive himself to [facility] ED for further workup and requested discharge. [facility] transfer center notified to inform ED of pt's possible arrival.       Course in ED:         Diagnostic Results:   The results of the diagnostic studies have been reviewed by myself:    Radiologic Studies  No results found.    Lab Studies  Labs Reviewed - No data to display    EKG: none          Procedure / Critical Care time/EKG:   Total time providing critical care:none      ATTESTATIONS   This chart was generated by an EMR and may contain errors or additions/omissions not intended by the user.           Dorie Rank, M.D.                 Joellyn Quails, MD  02/05/20 [phone]

## 2020-02-05 NOTE — ED Provider Notes (Signed)
Patient Name: Peter Ramos, Peter Ramos  Encounter Date:  02/05/2020  Attending Physician: Oretha Ellis, M.D.   Room:  CD35/CD35-A  Patient DOB:  [birth year]  Age: 59 y.o. male  MRN:  [id number]  PCP: Lequita Halt, MD       CLINICAL SUMMARY     Data Reviewed/Counseling:  I have independently reviewed the patient's vital signs, nursing notes, diagnostic results and other relevant tests/information/prior records/EMS reports/outside facility records. Differential diagnosis, plan and clinical decision making discussed with patient. I have discussed all results with patient. Shared decision making accomplished regarding workup, treatment, dispo.   MDM    Patient is a 59 y.o. male h/o kidney stones who was sent from [facility] ED for CT-A/P after he presented with sudden onset of severe left-sided flank pain which started at ~0300.Marland Kitchen       DDX: The differential diagnosis includes, but is not limited to vomiting, ureterolithiasis/renal colic, UTI/pyelonephritis, gastroenteritis/gastritis, peptic ulcer disease, GERD, appendicitis, aortic aneurysm, MI/angina/mesenteric ischemia, bowel perforation/obstruction, pancreatitis, cholecystitis, biliary colic, diverticulitis    -Labs: WBC 11.5 c/w likely stress response/demargination iso pain. UA: blood moderate, RBC 68 - no indication for empiric abx at this time.  -CT-A/P: 6mm stone at L upper pole UPJ, minimal hydronephrosis. Probable parapelvic cyst of lower pole moiety of L kidney. Mesenteric changes likely 2/2 inflammation or scarring from same - f/u "CT in one year may be helpful as on occasions, this can progress to lymphoma."  -On reassessment, patient's pain is much improved after receiving Toradol at [facility] ED. I discussed the results demonstrating 6mm L kidney stone which is likely the cause of his symptoms. After discussion, he elected d/c home with expectant management. I have referred him to Urology and  discussed strict return precautions. Also discussed incidental findings of mesenteric changes and recommendation for f/u CT in 1 year, patient will f/u with PCP re: these findings.    I have discussed the need for close outpatient follow up and the need to return to the ED if symptoms worsen or the patient has any other concerns. He assures me he will follow up with his PCP and Urology as discussed and/or return to the ED as needed. All questions were answered and concerns addressed. More extensive discharge instructions have been provided in the patient's discharge paperwork. Patient appreciative of care.    Procedures:   Procedures      Diagnosis:    Final diagnoses:   Kidney stone on left side      Disposition:   ED Disposition     ED Disposition Condition Date/Time Comment    Discharge  Wed Feb 05, 2020  1:32 PM [name] discharge to home/self care.    Condition at disposition: Stable          Discharge Medication List as of 02/05/2020  1:59 PM      START taking these medications    Details   ibuprofen (ADVIL) 600 MG tablet Take 1 tablet (600 mg total) by mouth every 6 (six) hours as needed for Pain, Starting Wed 02/05/2020, E-Rx      tamsulosin (FLOMAX) 0.4 MG Cap Take 1 capsule (0.4 mg total) by mouth Daily after dinner for 7 days, Starting Wed 02/05/2020, Until Wed 02/12/2020, Normal           Follow Up:      Follow-up Information     Hethcoat, Evelena Leyden, MD.    Specialty: Internal Medicine  Why: 5-7 days or sooner if needed  Contact information:  [address]  [place] [address]  [phone]             Clois Comber, MD.    Specialty: Urology  Why: 5-7 days or sooner if needed  Contact information:  [address]  [place] [address]  [phone]             [facility] Emergency Department.    Specialty: Emergency Medicine  Why: As needed, If symptoms worsen  Contact information:  [address]  [place] [address]  [phone]                  CLINICAL INFORMATION   NURSE TRIAGE:  Patient presents to ER for left flank pain x this AM. Seen at [facility] ER and sent to [facility] for CT scan.   HPI: History obtained from: patient, review of prior chart   Chief Complaint: Flank Pain    Chief Complaint   Patient presents with    Flank Pain   Flank Pain    Peter Ramos is a 59 y.o. male h/o kidney stones who presents as a transfer from [place] for CT scan due to sudden onset of severe left-sided flank pain which started at ~0300. Pt reports pain intermittently radiates to left lateral region and LLQ. Pt reports associated nausea and vomiting. Pt denies dysuria, hematuria, chest pain, SOB, fever, chills, diarrhea.      ROS: Physical Exam:      TRIAGE VITAL SIGNS: T 97.7 F (36.5 C)    HR (!) 108    BP 128/77    RR 16    SpO2 98 % on   None (Room air)    Wt 121.1 kg    BMI 39.5    SpO2 interpretation: normal. Intervention: None Needed  Review of Systems   Constitutional:        Negative for fever/chills.   HENT:        Negative for congestion, sore throat.   Eyes:        Negative for blurry vision.   Respiratory:        Negative for cough, shortness of breath.   Cardiovascular:        Negative for chest pain, leg swelling.   Gastrointestinal:        Positive for nausea, vomiting.   Genitourinary:        Positive for left flank pain.   Musculoskeletal:        Negative for myalgias.   Skin:        Negative for rash.   Neurological:        Negative for h/a, dizziness.    Physical Exam  Vitals and nursing note reviewed.   HENT:      Head: Normocephalic and atraumatic.      Mouth/Throat:      Mouth: Mucous membranes are moist.   Cardiovascular:      Rate and Rhythm: Normal rate and regular rhythm.   Pulmonary:      Effort: Pulmonary effort is normal.      Breath sounds: Normal breath sounds.   Abdominal:      General: Bowel sounds are normal. There is no distension.      Palpations: Abdomen is soft.      Tenderness: There is left CVA tenderness.   Musculoskeletal:         General: Normal range of  motion.   Skin:     General: Skin  is warm and dry.      Capillary Refill: Capillary refill takes less than 2 seconds.   Neurological:      General: No focal deficit present.      Mental Status: He is alert and oriented to person, place, and time.   Psychiatric:         Mood and Affect: Mood normal.         Behavior: Behavior normal.             PAST HISTORY      Primary Care Provider: Lequita Halt, MD   Allergies:   Patient has no known allergies.   PROB/PMH/PSH:   does not have any pertinent problems on file.  Patient Active Problem List    Diagnosis Date Noted    Pneumonia 05/27/2013    Chest pain 05/26/2013    Dyspnea 05/26/2013    has no past surgical history on file.   Social/Family History:   Social History     Tobacco Use    Smoking status: Never Smoker    Smokeless tobacco: Never Used   Substance Use Topics    Alcohol use: Yes     Comment: rarely    Drug use: No     Family History   Adopted: Yes   Problem Relation Age of Onset    Pulmonary embolism Other     Heart attack Father     Heart attack Sister       Listed Medications on Arrival:   Home Medications     Med List Status: In Progress Set By: Carole Binning, RN at 02/05/2020 10:54 AM                albuterol (PROVENTIL HFA;VENTOLIN HFA) 108 (90 BASE) MCG/ACT inhaler     Inhale 1-2 puffs into the lungs every 4 (four) hours as needed for Wheezing or Shortness of Breath (cough).     Ascorbic Acid (vitamin C) 1000 MG tablet     Take 1,000 mg by mouth daily     Garlic 10 MG Cap     Take by mouth     omeprazole (PriLOSEC) 20 MG capsule     Take 20 mg by mouth daily     pantoprazole (PROTONIX) 20 MG tablet     Take 20 mg by mouth daily.     vitamin D (CHOLECALCIFEROL) 25 MCG (1000 UT) tablet     Take 1,000 Units by mouth daily     zinc gluconate 50 MG tablet     Take 50 mg by mouth daily            VISIT INFORMATION   Orders in the ED:     Orders Placed This Encounter    CT Abdomen Pelvis dry  (Stone)    CBC and differential    Basic  Metabolic Panel    Urinalysis with Microscopic    Saline lock IV      Vital Signs in the ED:   Patient Vitals for the past 720 hrs:   Temp Pulse BP Resp SpO2 O2 Device   02/05/20 0941 97.7 F (36.5 C) (!) 108 128/77 16 98 % None (Room air)      Medications Given in the ED:   ED Medication Orders (From admission, onward)    None           RESULTS      Lab Results:   Results     Procedure Component Value  Units Date/Time    Basic Metabolic Panel [[id number]]  (Abnormal) Collected: 02/05/20 0950    Specimen: Plasma Updated: 02/05/20 1021     Sodium 139 mMol/L      Potassium 3.7 mMol/L      Chloride 108 mMol/L      CO2 21 mMol/L      Calcium 9.2 mg/dL      Glucose 409 mg/dL      Creatinine 8.11 mg/dL      BUN 18 mg/dL      Anion Gap 91.4 mMol/L      BUN / Creatinine Ratio 17.6 Ratio      EGFR 80 mL/min/1.42m2      Osmolality Calculated 284 mOsm/kg     Narrative:      ONLY 1 LAVENDERTOP ANND NO BLUETOP  ONLY 1 LAVENDERTOP ANND NO BLUETOP    CBC and differential [[id number]]  (Abnormal) Collected: 02/05/20 0950    Specimen: Blood Updated: 02/05/20 1019     WBC 11.5 K/cmm      RBC 5.14 M/cmm      Hemoglobin 14.7 gm/dL      Hematocrit 08.6 %      MCV 91 fL      MCH 29 pg      MCHC 32 gm/dL      RDW 57.8 %      PLT CT 229 K/cmm      MPV 6.8 fL      Neutrophils % 89.5 %      Lymphocytes 7.2 %      Monocytes 2.7 %      Eosinophils % 0.3 %      Basophils % 0.3 %      Neutrophils Absolute 10.3 K/cmm      Lymphocytes Absolute 0.8 K/cmm      Monocytes Absolute 0.3 K/cmm      Eosinophils Absolute 0.0 K/cmm      Basophils Absolute 0.0 K/cmm     Narrative:      ONLY 1 LAVENDERTOP ANND NO BLUETOP  ONLY 1 LAVENDERTOP ANND NO BLUETOP    Urinalysis with Microscopic [[id number]]  (Abnormal) Collected: 02/05/20 0950    Specimen: Urine, Random Updated: 02/05/20 1009     Color, UA Yellow     Clarity, UA Slightly Cloudy     Urine Specific Gravity 1.028     pH, Urine 5.0 pH      Protein, UR 30 mg/dL      Glucose, UA >=413 mg/dL      Ketones UA  5 mg/dL      Bilirubin, UA Negative     Blood, UA Moderate     Nitrite, UA Negative     Urobilinogen, UA Normal mg/dL      Leukocyte Esterase, UA Negative Leu/uL      UR Micro Performed     RBC, UA 68 /hpf      Squam Epithel, UA 1 /hpf          Radiology Results:   CT Abdomen Pelvis dry  Larina Bras)    Result Date: 02/05/2020  1.  6 mm stone at the left upper pole UPJ, with minimal hydronephrosis of the upper pole moiety of the left kidney. 2.  No other urinary tract stones on either side. 3.  Probable parapelvic cyst of the lower pole moiety of the left kidney. 4.  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 5.  Mesenteric changes likely due to inflammation or scarring from the same: A follow-up CT in one  year may be helpful as on occasion, this can progress to lymphoma. 6.  Other chronic findings as above. ReadingStation:[email or web address]    EKG Results:   None        Scribe Attestation:   I was acting as a Neurosurgeon for Oretha Ellis, MD on Peter Ramos  Treatment Team: Scribe: Tamala Ser    I am the first provider for this patient and I personally performed the services documented. Marcella Dubs M is scribing for me on [facility]. This note and the patient instructions accurately reflect work and decisions made by me.  Oretha Ellis, MD     *This note was generated by the Epic EMR system/ Dragon speech recognition and may contain inherent errors or omissions not intended by the user. Grammatical errors, random word insertions, deletions, pronoun errors and incomplete sentences are occasional consequences of this technology due to software limitations. Not all errors are caught or corrected. If there are questions or concerns about the content of this note or information contained within the body of this dictation they should be addressed directly with the author for clarification.        Oretha Ellis, MD  02/12/20 [phone]

## 2020-02-05 NOTE — Discharge Instructions (Addendum)
As discussed, your CT scan demonstrated a left sided 6mm stone which is likely causing your pain. Additional incidental findings were found as below.    You have been prescribed symptomatic medications including an opioid narcotic. Please take as prescribed.    Please call urology today to schedule appointment in the next 5 to 7 days or sooner as needed.  Please return to the emergency department with worsening symptoms as discussed but particularly if you have severe pain, inability to urinate, fevers/chills, confusion, weakness, intractable nausea and vomiting or any other concerns.    Please follow up with Dr. Rosilyn Mings in 5-7 days or sooner as needed. Ensure you discuss the CT scan results and recommendation for repeat scan in one year as below.    CT Abdomen Pelvis dry  (Stone) (Final result)    Impression:    1.  6 mm stone at the left upper pole UPJ, with minimal hydronephrosis of the upper pole moiety of the left kidney.   2.  No other urinary tract stones on either side.   3.  Probable parapelvic cyst of the lower pole moiety of the left kidney.   4.  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   5.  Mesenteric changes likely due to inflammation or scarring from the same: A follow-up CT in one year may be helpful as on occasion, this can progress to lymphoma.   6.  Other chronic findings as above.

## 2020-02-05 NOTE — ED Triage Notes (Signed)
Pt arrived after [facility] for kidney stone work up, sent here for ct scan. Pt has left flank pain

## 2020-03-10 ENCOUNTER — Ambulatory Visit (INDEPENDENT_AMBULATORY_CARE_PROVIDER_SITE_OTHER): Payer: Managed Care, Other (non HMO) | Admitting: Physician Assistant

## 2020-03-10 ENCOUNTER — Other Ambulatory Visit: Payer: Self-pay

## 2020-03-10 VITALS — BP 159/61 | HR 67 | Temp 97.6°F | Ht 72.0 in | Wt 248.2 lb

## 2020-03-10 DIAGNOSIS — Z23 Encounter for immunization: Secondary | ICD-10-CM | POA: Diagnosis not present

## 2020-03-23 ENCOUNTER — Ambulatory Visit: Payer: Managed Care, Other (non HMO) | Admitting: Dermatology

## 2020-03-30 ENCOUNTER — Telehealth: Payer: Self-pay | Admitting: Physician Assistant

## 2020-03-30 ENCOUNTER — Other Ambulatory Visit: Payer: Self-pay | Admitting: Physician Assistant

## 2020-03-30 ENCOUNTER — Other Ambulatory Visit: Payer: Self-pay | Admitting: Family Medicine

## 2020-03-30 DIAGNOSIS — F430 Acute stress reaction: Secondary | ICD-10-CM

## 2020-03-30 DIAGNOSIS — E1159 Type 2 diabetes mellitus with other circulatory complications: Secondary | ICD-10-CM

## 2020-03-30 DIAGNOSIS — I152 Hypertension secondary to endocrine disorders: Secondary | ICD-10-CM

## 2020-03-30 MED ORDER — LOSARTAN POTASSIUM 100 MG PO TABS: 100.0000 mg | ORAL_TABLET | Freq: Every day | ORAL | 0 refills | Status: DC

## 2020-03-30 MED ORDER — FLUOXETINE HCL 20 MG PO CAPS: 20.0000 mg | ORAL_CAPSULE | Freq: Every day | ORAL | 0 refills | Status: DC

## 2020-03-30 NOTE — Telephone Encounter (Signed)
Patient is requesting a refill of his losartan and fluoxetine, if approved please send to [facility].

## 2020-03-30 NOTE — Addendum Note (Signed)
Addended by: Stan Head on: 03/30/2020 11:38 AM   Modules accepted: Orders

## 2020-04-02 ENCOUNTER — Telehealth: Payer: Managed Care, Other (non HMO) | Admitting: Medical-Surgical

## 2020-04-30 ENCOUNTER — Ambulatory Visit: Payer: Managed Care, Other (non HMO) | Admitting: Physician Assistant

## 2020-06-12 ENCOUNTER — Other Ambulatory Visit: Payer: Self-pay

## 2020-06-12 ENCOUNTER — Ambulatory Visit (INDEPENDENT_AMBULATORY_CARE_PROVIDER_SITE_OTHER): Payer: Managed Care, Other (non HMO) | Admitting: Physician Assistant

## 2020-06-12 ENCOUNTER — Encounter: Payer: Self-pay | Admitting: Physician Assistant

## 2020-06-12 VITALS — Temp 98.6°F | Ht 72.0 in | Wt 241.0 lb

## 2020-06-12 DIAGNOSIS — F430 Acute stress reaction: Secondary | ICD-10-CM | POA: Diagnosis not present

## 2020-06-12 DIAGNOSIS — F4323 Adjustment disorder with mixed anxiety and depressed mood: Secondary | ICD-10-CM

## 2020-06-12 DIAGNOSIS — E1169 Type 2 diabetes mellitus with other specified complication: Secondary | ICD-10-CM | POA: Diagnosis not present

## 2020-06-12 DIAGNOSIS — E1159 Type 2 diabetes mellitus with other circulatory complications: Secondary | ICD-10-CM | POA: Diagnosis not present

## 2020-06-12 DIAGNOSIS — I152 Hypertension secondary to endocrine disorders: Secondary | ICD-10-CM

## 2020-06-12 MED ORDER — LOSARTAN POTASSIUM 100 MG PO TABS: 100.0000 mg | ORAL_TABLET | Freq: Every day | ORAL | 1 refills | Status: DC

## 2020-06-12 MED ORDER — FLUOXETINE HCL 20 MG PO CAPS: 20.0000 mg | ORAL_CAPSULE | Freq: Every day | ORAL | 1 refills | Status: DC

## 2020-06-12 NOTE — Progress Notes (Signed)
Telehealth office visit note for Nathan Masker, PA-C- at Primary Care at [facility]   I connected with current patient today by telephone and verified that I am speaking with the correct person   . Location of the patient: Home . Location of the provider: Office - This visit type was conducted due to national recommendations for restrictions regarding the COVID-19 Pandemic (e.g. social distancing) in an effort to limit this patient's exposure and mitigate transmission in our community.    - No physical exam could be performed with this format, beyond that communicated to [place] by the patient/ family members as noted.   - Additionally my office staff/ schedulers were to discuss with the patient that there may be a monetary charge related to this service, depending on their medical insurance.  My understanding is that patient understood and consented to proceed.     _________________________________________________________________________________   History of Present Illness: Patient calls in to follow up on diabetes mellitus, hypertension, and mood management. Has no acute concerns today.  Diabetes: Pt denies increased urination or thirst. Patient usually checks glucose when not feeling well. Patient stays pretty active and follows a healthy diet (keto). He has been able to manage diabetes with diet and exercise.  HTN: Pt denies chest pain, palpitations, dizziness or lower extremity swelling. Taking medication as directed without side effects. Sometimes checks his blood pressure. Pt tries to stay well hydrated.   Mood: Prozac is working well. Has no concerns.    GAD 7 : Generalized Anxiety Score 06/12/2020 09/04/2019 02/16/2017  Nervous, Anxious, on Edge 0 1 1  Control/stop worrying 1 0 1  Worry too much - different things 1 1 1   Trouble relaxing 0 0 1  Restless 0 0 0  Easily annoyed or irritable 0 0 1  Afraid - awful might happen 0 0 1  Total GAD 7 Score 2 2 6   Anxiety  Difficulty Not difficult at all Not difficult at all Somewhat difficult    Depression screen [facility] 2/9 06/12/2020 01/02/2020 09/04/2019 07/03/2018 02/05/2018  Decreased Interest 0 0 0 0 0  Down, Depressed, Hopeless 0 0 0 1 0  PHQ - 2 Score 0 0 0 1 0  Altered sleeping 3 0 3 1 2   Tired, decreased energy 1 1 1  0 0  Change in appetite 0 1 1 0 0  Feeling bad or failure about yourself  0 0 0 0 0  Trouble concentrating 0 0 0 1 0  Moving slowly or fidgety/restless 0 0 0 0 0  Suicidal thoughts 0 0 0 0 0  PHQ-9 Score 4 2 5 3 2   Difficult doing work/chores Not difficult at all Not difficult at all Not difficult at all Not difficult at all Not difficult at all      Impression and Recommendations:     1. Type 2 diabetes mellitus with other specified complication, without long-term current use of insulin (HCC)   2. Hypertension associated with diabetes (HCC)   3. Adjustment disorder with mixed anxiety and depressed mood   4. Acute reaction to situational stress     Type 2 diabetes mellitus with other specified complication, without long-term current use of insulin: -Last A1c stable -Continue with low carbohydrate and glucose diet. -Continue physical activity level. -Will continue to monitor. -Advised patient to schedule lab visit for fasting blood work including A1c.  Hypertension associated with diabetes: -Unable to obtain BP today. -Continue current medication regimen. -Follow a low-sodium  diet and continue to stay well-hydrated. -Will continue to monitor.  Adjustment disorder with mixed anxiety and depressed mood: -Stable -Continue current medication regimen. -Will continue to monitor.   - As part of my medical decision making, I reviewed the following data within the electronic MEDICAL RECORD NUMBER History obtained from pt /family, CMA notes reviewed and incorporated if applicable, Labs reviewed, Radiograph/ tests reviewed if applicable and OV notes from prior OV's with me, as well as any  other specialists she/he has seen since seeing me last, were all reviewed and used in my medical decision making process today.    - Additionally, when appropriate, discussion had with patient regarding our treatment plan, and their biases/concerns about that plan were used in my medical decision making today.    - The patient agreed with the plan and demonstrated an understanding of the instructions.   No barriers to understanding were identified.     - The patient was advised to call back or seek an in-person evaluation if the symptoms worsen or if the condition fails to improve as anticipated.   Return in about 4 months (around 10/13/2020) for DM, HTN, Mood; lab visit for FBW (lipid panel, cmp, a1c) within next few weeks.    No orders of the defined types were placed in this encounter.   Meds ordered this encounter  Medications  . FLUoxetine (PROZAC) 20 MG capsule    Sig: Take 1 capsule (20 mg total) by mouth daily.    Dispense:  90 capsule    Refill:  1    Order Specific Question:   Supervising Provider    Answer:   Nani Gasser D [[id number]]  . losartan (COZAAR) 100 MG tablet    Sig: Take 1 tablet (100 mg total) by mouth daily.    Dispense:  90 tablet    Refill:  1    Order Specific Question:   Supervising Provider    Answer:   Nani Gasser D [[id number]]    Medications Discontinued During This Encounter  Medication Reason  . FLUoxetine (PROZAC) 20 MG capsule Reorder  . losartan (COZAAR) 100 MG tablet Reorder       Time spent on visit including pre-visit chart review and post-visit care was 10 minutes.      The 21st Century Cures Act was signed into law in 2016 which includes the topic of electronic health records.  This provides immediate access to information in MyChart.  This includes consultation notes, operative notes, office notes, lab results and pathology reports.  If you have any questions about what you read please let us know at your next visit or call us  at the office.  We are right here with you.  Note:  This note was prepared with assistance of Dragon voice recognition software. Occasional wrong-word or sound-a-like substitutions may have occurred due to the inherent limitations of voice recognition software.  __________________________________________________________________________________     Patient Care Team    Relationship Specialty Notifications Start End  Nathan Kelley, [place] PCP - General   01/05/20      -Vitals obtained; medications/ allergies reconciled;  personal medical, social, Sx etc.histories were updated by CMA, reviewed by me and are reflected in chart   Patient Active Problem List   Diagnosis Date Noted  . Family history of early CAD-  Dad died age 35 AMI w CHF 10-10-19  . H/O noncompliance with medical treatment, presenting hazards to health Oct 10, 2019  . Adjustment disorder with mixed anxiety and depressed mood  09/22/2019  . Personal History of nephrolithiasis 09/22/2019  . Morbid obesity (HCC)- bmi 30+ with DM, HTN, h/o TIA 12/03/2018  . Chronic pansinusitis 11/27/2017  . Diabetes mellitus (HCC)- diet controlled 11/27/2017  . Pain and swelling of left elbow 06/28/2017  . Plantar fasciitis 02/16/2017  . Family history of diabetes mellitus- Mom and sister 02/16/2017  . Acute reaction to situational stress 02/16/2017  . Neuropathic pain of right foot 02/16/2017  . Obesity 10/13/2015  . Numbness of foot 10/13/2015  . Sleep disorder 06/25/2014  . Hypertension associated with diabetes (HCC) 11/26/2009  . ANXIETY 07/13/2009  . Personal history of TIA (transient ischemic attack) 12/23/2008  . GERD 10/10/2007  . Other dysphagia 10/10/2007     Current Meds  Medication Sig  . acetaminophen (TYLENOL) 500 MG tablet Take 1,000 mg by mouth every 6 (six) hours as needed for mild pain or headache.  Marland Kitchen aspirin 325 MG tablet Take 325 mg by mouth daily.    Marland Kitchen FLUoxetine (PROZAC) 20 MG capsule Take 1 capsule (20 mg  total) by mouth daily.  Marland Kitchen loperamide (IMODIUM) 2 MG capsule Take 4 mg by mouth as needed for diarrhea or loose stools.  Marland Kitchen losartan (COZAAR) 100 MG tablet Take 1 tablet (100 mg total) by mouth daily.  . naproxen sodium (ALEVE) 220 MG tablet Take 220 mg by mouth as needed (pain).  Marland Kitchen OVER THE COUNTER MEDICATION Apply 1 application topically daily as needed (pain). CBD cream  . [DISCONTINUED] FLUoxetine (PROZAC) 20 MG capsule Take 1 capsule (20 mg total) by mouth daily.  . [DISCONTINUED] losartan (COZAAR) 100 MG tablet Take 1 tablet (100 mg total) by mouth daily.     Allergies:  No Known Allergies   ROS:  See above HPI for pertinent positives and negatives   Objective:   Temperature 98.6 F (37 C), height 6' (1.829 m), weight 241 lb (109.3 kg).  (if some vitals are omitted, this means that patient was UNABLE to obtain them.) General: A & O * 3; sounds in no acute distress Respiratory: speaking in full sentences, no conversational dyspnea Psych: insight appears good, mood- appears full

## 2020-06-22 ENCOUNTER — Other Ambulatory Visit: Payer: Managed Care, Other (non HMO)

## 2020-06-24 ENCOUNTER — Emergency Department
Admission: EM | Admit: 2020-06-24 | Discharge: 2020-06-24 | Disposition: A | Discharge status: Home / Self Care | Payer: Commercial Managed Care - POS | Attending: Emergency Medicine | Admitting: Emergency Medicine

## 2020-06-24 ENCOUNTER — Emergency Department: Payer: Commercial Managed Care - POS

## 2020-06-24 DIAGNOSIS — R110 Nausea: Secondary | ICD-10-CM | POA: Insufficient documentation

## 2020-06-24 DIAGNOSIS — N201 Calculus of ureter: Secondary | ICD-10-CM | POA: Insufficient documentation

## 2020-06-24 DIAGNOSIS — N23 Unspecified renal colic: Secondary | ICD-10-CM

## 2020-06-24 DIAGNOSIS — R350 Frequency of micturition: Secondary | ICD-10-CM | POA: Insufficient documentation

## 2020-06-24 HISTORY — DX: Calculus of kidney: N20.0

## 2020-06-24 LAB — CBC AND DIFFERENTIAL
Basophils %: 0.2 % (ref 0.0–3.0)
Basophils Absolute: 0 10*3/uL (ref 0.0–0.3)
Eosinophils %: 3.5 % (ref 0.0–7.0)
Eosinophils Absolute: 0.4 10*3/uL (ref 0.0–0.8)
Hematocrit: 46.7 % (ref 39.0–52.5)
Hemoglobin: 15.2 gm/dL (ref 13.0–17.5)
Lymphocytes Absolute: 2 10*3/uL (ref 0.6–5.1)
Lymphocytes: 17.4 % (ref 15.0–46.0)
MCH: 30 pg (ref 28–35)
MCHC: 33 gm/dL (ref 32–36)
MCV: 92 fL (ref 80–100)
MPV: 7 fL (ref 6.0–10.0)
Monocytes Absolute: 1.1 10*3/uL (ref 0.1–1.7)
Monocytes: 9.7 % (ref 3.0–15.0)
Neutrophils %: 69.2 % (ref 42.0–78.0)
Neutrophils Absolute: 7.8 10*3/uL (ref 1.7–8.6)
PLT CT: 250 10*3/uL (ref 130–440)
RBC: 5.08 10*6/uL (ref 4.00–5.70)
RDW: 12.7 % (ref 11.0–14.0)
WBC: 11.3 10*3/uL — ABNORMAL HIGH (ref 4.0–11.0)

## 2020-06-24 LAB — VH URINALYSIS WITH MICROSCOPIC IF INDICATED
Bilirubin, UA: NEGATIVE
Glucose, UA: NEGATIVE mg/dL
Ketones UA: NEGATIVE mg/dL
Leukocyte Esterase, UA: NEGATIVE Leu/uL
Nitrite, UA: NEGATIVE
Protein, UR: NEGATIVE mg/dL
RBC, UA: 13 /hpf — ABNORMAL HIGH (ref 0–4)
Squam Epithel, UA: 2 /hpf (ref 0–2)
Urine Specific Gravity: 1.014 (ref 1.001–1.040)
Urobilinogen, UA: NORMAL mg/dL
WBC, UA: 2 /hpf (ref 0–4)
pH, Urine: 5 pH (ref 5.0–8.0)

## 2020-06-24 LAB — COMPREHENSIVE METABOLIC PANEL
ALT: 42 U/L (ref 0–55)
AST (SGOT): 23 U/L (ref 10–42)
Albumin/Globulin Ratio: 1.33 Ratio (ref 0.80–2.00)
Albumin: 4 gm/dL (ref 3.5–5.0)
Alkaline Phosphatase: 65 U/L (ref 40–145)
Anion Gap: 14.6 mMol/L (ref 7.0–18.0)
BUN / Creatinine Ratio: 16.8 Ratio (ref 10.0–30.0)
BUN: 16 mg/dL (ref 7–22)
Bilirubin, Total: 0.4 mg/dL (ref 0.1–1.2)
CO2: 22 mMol/L (ref 20–30)
Calcium: 9.4 mg/dL (ref 8.5–10.5)
Chloride: 109 mMol/L (ref 98–110)
Creatinine: 0.95 mg/dL (ref 0.80–1.30)
EGFR: 87 mL/min/{1.73_m2} (ref 60–150)
Globulin: 3 gm/dL (ref 2.0–4.0)
Glucose: 103 mg/dL — ABNORMAL HIGH (ref 71–99)
Osmolality Calculated: 285 mOsm/kg (ref 275–300)
Potassium: 3.6 mMol/L (ref 3.5–5.3)
Protein, Total: 7 gm/dL (ref 6.0–8.3)
Sodium: 142 mMol/L (ref 136–147)

## 2020-06-24 MED ORDER — SODIUM CHLORIDE 0.9 % IV BOLUS
500.00 mL | Freq: Once | INTRAVENOUS | Status: AC
Start: 2020-06-24 — End: 2020-06-24
  Administered 2020-06-24: 10:00:00 500 mL via INTRAVENOUS

## 2020-06-24 MED ORDER — KETOROLAC TROMETHAMINE 15 MG/ML IJ SOLN
15.00 mg | Freq: Once | INTRAMUSCULAR | Status: AC
Start: 2020-06-24 — End: 2020-06-24
  Administered 2020-06-24: 10:00:00 15 mg via INTRAVENOUS

## 2020-06-24 MED ORDER — HYDROCODONE-ACETAMINOPHEN 5-325 MG PO TABS
1.0000 | ORAL_TABLET | Freq: Four times a day (QID) | ORAL | 0 refills | Status: DC | PRN
Start: 2020-06-24 — End: 2020-06-24

## 2020-06-24 MED ORDER — HYDROCODONE-ACETAMINOPHEN 5-325 MG PO TABS
1.0000 | ORAL_TABLET | Freq: Four times a day (QID) | ORAL | 0 refills | Status: DC | PRN
Start: 2020-06-24 — End: 2021-10-22

## 2020-06-24 MED ORDER — TAMSULOSIN HCL 0.4 MG PO CAPS
0.40 mg | ORAL_CAPSULE | Freq: Every day | ORAL | 0 refills | Status: DC
Start: 2020-06-24 — End: 2021-10-22

## 2020-06-24 MED ORDER — ONDANSETRON 4 MG PO TBDP
4.00 mg | ORAL_TABLET | Freq: Three times a day (TID) | ORAL | 0 refills | Status: AC | PRN
Start: 2020-06-24 — End: 2020-06-27

## 2020-06-24 MED ORDER — KETOROLAC TROMETHAMINE 15 MG/ML IJ SOLN
INTRAMUSCULAR | Status: AC
Start: 2020-06-24 — End: ?
  Filled 2020-06-24: qty 1

## 2020-06-24 NOTE — ED Triage Notes (Signed)
Pt here today for symptoms starting this morning- dysuria- burning, difficulty voiding. No blood in the urine. Left flank pain. Hx of kidney stone last June. Denies n/v/d or fevers.

## 2020-06-24 NOTE — ED Provider Notes (Addendum)
[facility] EMERGENCY DEPARTMENT History and Physical Exam      Patient Name: Peter Ramos, Peter Ramos  Encounter Date:  06/24/2020  Attending Physician: Joesphine Bare, MD  PCP: Lequita Halt, MD  Patient DOB:  [birth year]  MRN:  [id number]  Room:  RAUB/RAU-B      History of Presenting Illness     Chief complaint: Flank Pain    HPI/ROS is limited by: none  HPI/ROS given by: patient    Location: Left flank  Duration: Yesterday  Severity: moderate    Peter Ramos is a 59 y.o. male who presents with left leg pain. Patient notes it began last night. Patient denies any history of any fevers or chills he is history of previous kidney stone. He attempted to contact the urologist this morning was told he cannot be seen until January 2022. No history of any upper abdominal pain. Association with food. No trauma no falls. No testicular pain note some pain which radiates into the abdomen. He denies any cough hemoptysis or hematemesis. He does note nausea no current vomiting. He has continued to form his activity living. He denies any trauma any falls. He notes similar symptoms when he had his previous kidney stone      Review of Systems   Review of Systems   Constitutional: Negative.  Negative for chills and fever.   HENT: Negative.    Eyes: Negative.    Respiratory: Negative.  Negative for cough and shortness of breath.    Cardiovascular: Negative.  Negative for chest pain.   Gastrointestinal: Positive for nausea. Negative for abdominal pain, blood in stool, constipation, diarrhea and vomiting.   Genitourinary: Positive for flank pain, frequency and urgency. Negative for dysuria and hematuria.   Musculoskeletal: Negative for joint pain and myalgias.   Skin: Negative.  Negative for rash.   Neurological: Negative for dizziness, sensory change, focal weakness, loss of consciousness and headaches.   Endo/Heme/Allergies: Negative.    Psychiatric/Behavioral: Negative.    All other systems reviewed and are  negative.          Allergies     Pt has No Known Allergies.    Medications     No current facility-administered medications for this encounter.    Current Outpatient Medications:     albuterol (PROVENTIL HFA;VENTOLIN HFA) 108 (90 BASE) MCG/ACT inhaler, Inhale 1-2 puffs into the lungs every 4 (four) hours as needed for Wheezing or Shortness of Breath (cough)., Disp: 1 Inhaler, Rfl: 0    Ascorbic Acid (vitamin C) 1000 MG tablet, Take 1,000 mg by mouth daily, Disp: , Rfl:     Garlic 10 MG Cap, Take by mouth, Disp: , Rfl:     HYDROcodone-acetaminophen (NORCO) 5-325 MG per tablet, Take 1-2 tablets by mouth every 6 (six) hours as needed for Pain (severe pain), Disp: 15 tablet, Rfl: 0    ibuprofen (ADVIL) 600 MG tablet, Take 1 tablet (600 mg total) by mouth every 6 (six) hours as needed for Pain, Disp: 40 tablet, Rfl: 0    omeprazole (PriLOSEC) 20 MG capsule, Take 20 mg by mouth daily, Disp: , Rfl:     ondansetron (ZOFRAN-ODT) 4 MG disintegrating tablet, Take 1 tablet (4 mg total) by mouth every 8 (eight) hours as needed for Nausea, Disp: 15 tablet, Rfl: 0    pantoprazole (PROTONIX) 20 MG tablet, Take 20 mg by mouth daily., Disp: , Rfl:     tamsulosin (FLOMAX) 0.4 MG Cap, Take 1 capsule (0.4 mg total) by mouth Daily  after dinner, Disp: 7 capsule, Rfl: 0    vitamin D (CHOLECALCIFEROL) 25 MCG (1000 UT) tablet, Take 1,000 Units by mouth daily, Disp: , Rfl:     zinc gluconate 50 MG tablet, Take 50 mg by mouth daily, Disp: , Rfl:    Medications were reviewed by md  Past Medical History     Pt  Past Medical History:   Diagnosis Date    Calculus of kidney     GERD (gastroesophageal reflux disease)     OSA (obstructive sleep apnea)     wears cpap at night     Past medical history was reviewed by md  Past Surgical History     PtHistory reviewed. No pertinent surgical history.  Past surgical history was reviewed by md  Family History     Family History   Adopted: Yes   Problem Relation Age of Onset    Pulmonary  embolism Other     Heart attack Father     Heart attack Sister      The family history was reviewed by md  Social History     Pt  Social History     Socioeconomic History    Marital status: Married     Spouse name: None    Number of children: None    Years of education: None    Highest education level: None   Occupational History    None   Tobacco Use    Smoking status: Never Smoker    Smokeless tobacco: Never Used   Substance and Sexual Activity    Alcohol use: Yes     Comment: rarely    Drug use: No    Sexual activity: None   Other Topics Concern    None   Social History Narrative    None     Social Determinants of Health     Financial Resource Strain:     Difficulty of Paying Living Expenses: Not on file   Food Insecurity:     Worried About Programme researcher, broadcasting/film/video in the Last Year: Not on file    [employer] of Food in the Last Year: Not on file   Transportation Needs:     Lack of Transportation (Medical): Not on file    Lack of Transportation (Non-Medical): Not on file   Physical Activity:     Days of Exercise per Week: Not on file    Minutes of Exercise per Session: Not on file   Stress:     Feeling of Stress : Not on file   Social Connections:     Frequency of Communication with Friends and Family: Not on file    Frequency of Social Gatherings with Friends and Family: Not on file    Attends Religious Services: Not on file    Active Member of Clubs or Organizations: Not on file    Attends Banker Meetings: Not on file    Marital Status: Not on file   Intimate Partner Violence:     Fear of Current or Ex-Partner: Not on file    Emotionally Abused: Not on file    Physically Abused: Not on file    Sexually Abused: Not on file   Housing Stability:     Unable to Pay for Housing in the Last Year: Not on file    Number of Places Lived in the Last Year: Not on file    Unstable Housing in the Last Year: Not on file  Social history reviewed by md  Physical Exam     Blood pressure  128/89, pulse 69, temperature 97 F (36.1 C), resp. rate 18, height 1.753 m, weight 125.5 kg, SpO2 97 %.    Physical Exam  Vitals and nursing note reviewed.   Constitutional:       General: He is not in acute distress.     Appearance: Normal appearance. He is well-developed. He is not ill-appearing, toxic-appearing or diaphoretic.      Comments: Patient sitting upright in no acute apparent distress conversing without difficulty   HENT:      Head: Normocephalic and atraumatic.      Right Ear: External ear normal.      Left Ear: External ear normal.      Nose: Nose normal.      Mouth/Throat:      Mouth: Mucous membranes are dry.      Pharynx: Oropharynx is clear.   Eyes:      General:         Right eye: No discharge.         Left eye: No discharge.      Conjunctiva/sclera: Conjunctivae normal.      Pupils: Pupils are equal, round, and reactive to light.   Cardiovascular:      Rate and Rhythm: Normal rate and regular rhythm.      Pulses: Normal pulses.      Heart sounds: Normal heart sounds. No murmur heard.   No friction rub. No gallop.    Pulmonary:      Effort: Pulmonary effort is normal. No respiratory distress.      Breath sounds: Normal breath sounds. No stridor. No wheezing, rhonchi or rales.   Abdominal:      General: Bowel sounds are normal. There is no distension.      Palpations: Abdomen is soft. There is no mass.      Tenderness: There is no abdominal tenderness. There is no guarding or rebound.      Hernia: No hernia is present.   Genitourinary:     Comments: Mild left CVA tenderness  Musculoskeletal:         General: No swelling, tenderness, deformity or signs of injury. Normal range of motion.      Cervical back: Normal range of motion and neck supple.      Right lower leg: No edema.      Left lower leg: No edema.   Skin:     General: Skin is warm.      Capillary Refill: Capillary refill takes less than 2 seconds.      Coloration: Skin is not jaundiced or pale.      Findings: No erythema or rash.    Neurological:      General: No focal deficit present.      Mental Status: He is alert and oriented to person, place, and time. Mental status is at baseline.      Cranial Nerves: No cranial nerve deficit.      Sensory: No sensory deficit.      Motor: No weakness.      Coordination: Coordination normal.      Deep Tendon Reflexes: Reflexes are normal and symmetric.   Psychiatric:         Mood and Affect: Mood normal.         Behavior: Behavior normal.         Thought Content: Thought content normal.  Judgment: Judgment normal.             Orders Placed     Orders Placed This Encounter   Procedures    CT Abdomen Pelvis dry  (Stone)    CBC and differential    Comprehensive metabolic panel    Urinalysis with Microscopic if Indicated       Diagnostic Results       The results of the diagnostic studies below have been reviewed by myself:    Labs  Results     Procedure Component Value Units Date/Time    Urinalysis with Microscopic if Indicated [[id number]]  (Abnormal) Collected: 06/24/20 1201    Specimen: Urine, Random Updated: 06/24/20 1230     Color, UA Yellow     Clarity, UA Clear     Urine Specific Gravity 1.014     pH, Urine 5.0 pH      Protein, UR Negative mg/dL      Glucose, UA Negative mg/dL      Ketones UA Negative mg/dL      Bilirubin, UA Negative     Blood, UA Small     Nitrite, UA Negative     Urobilinogen, UA Normal mg/dL      Leukocyte Esterase, UA Negative Leu/uL      UR Micro Performed     WBC, UA 2 /hpf      RBC, UA 13 /hpf      Bacteria, UA Rare /hpf      Squam Epithel, UA 2 /hpf     Comprehensive metabolic panel [[id number]]  (Abnormal) Collected: 06/24/20 1018    Specimen: Plasma Updated: 06/24/20 1125     Sodium 142 mMol/L      Potassium 3.6 mMol/L      Chloride 109 mMol/L      CO2 22 mMol/L      Calcium 9.4 mg/dL      Glucose 762 mg/dL      Creatinine 8.31 mg/dL      BUN 16 mg/dL      Protein, Total 7.0 gm/dL      Albumin 4.0 gm/dL      Alkaline Phosphatase 65 U/L      ALT 42 U/L      AST  (SGOT) 23 U/L      Bilirubin, Total 0.4 mg/dL      Albumin/Globulin Ratio 1.33 Ratio      Anion Gap 14.6 mMol/L      BUN / Creatinine Ratio 16.8 Ratio      EGFR 87 mL/min/1.6m2      Osmolality Calculated 285 mOsm/kg      Globulin 3.0 gm/dL     CBC and differential [[id number]]  (Abnormal) Collected: 06/24/20 1018    Specimen: Blood Updated: 06/24/20 1112     WBC 11.3 K/cmm      RBC 5.08 M/cmm      Hemoglobin 15.2 gm/dL      Hematocrit 71.0 %      MCV 92 fL      MCH 30 pg      MCHC 33 gm/dL      RDW 62.6 %      PLT CT 250 K/cmm      MPV 7.0 fL      Neutrophils % 69.2 %      Lymphocytes 17.4 %      Monocytes 9.7 %      Eosinophils % 3.5 %      Basophils % 0.2 %  Neutrophils Absolute 7.8 K/cmm      Lymphocytes Absolute 2.0 K/cmm      Monocytes Absolute 1.1 K/cmm      Eosinophils Absolute 0.4 K/cmm      Basophils Absolute 0.0 K/cmm           Radiologic Studies  Radiology Results (24 Hour)     Procedure Component Value Units Date/Time    CT Abdomen Pelvis dry  Larina Bras) [[id number]] Collected: 06/24/20 1021    Order Status: Completed Updated: 06/24/20 1032    Narrative:      Clinical History:  Flank pain, kidney stone suspected    Examination:  CT scan of the abdomen and pelvis without oral or intravenous contrast.    CT images were acquired utilizing Automated Exposure Control for dose reduction. Sagittal and coronal reformations were reviewed.    Comparison:  February 05, 2020    Findings:  Evaluation of the solid organs and vessels may be limited by the absence of intravenous contrast.    Lower chest:  Lung bases: The visualized portions of the lung bases are clear.  Heart: The heart is normal in size.    Abdomen:  Liver: Unremarkable.  Biliary system: No biliary ductal dilatation is seen. The gallbladder is unremarkable in appearance.  Spleen: Unremarkable.  Pancreas:There is mild atrophy of the pancreas.  Adrenal glands:Unremarkable.  Kidneys: The right kidney is normal in appearance. 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 An isodense   approximately 3.5 cm is noted along the anterior aspect of the mid left kidney. This is suspected to represent a cyst with internal debris.   Stomach: Unremarkable.  Bowel: Evaluation of the bowel is limited secondary to underdistention. There is no evidence of high-grade obstruction. No suspicious bowel wall thickening is seen.  Appendix: The appendix is normal in caliber without surrounding inflammatory changes.  Peritoneal cavity:No free air, free fluid or pathologic adenopathy is identified.  Retroperitoneum: There are no significant retroperitoneal inflammatory changes. No pathologic adenopathy is seen. The aorta and IVC are normal in course and caliber. There is mild to moderate atherosclerosis.    Pelvis:  Bladder: Unremarkable.  Reproductive system: The prostate and seminal vesicles are unremarkable in appearance.  Miscellaneous: No free fluid or pathologic adenopathy is seen in the pelvis.    Other:  Bones: Mild scattered bony degenerative changes are present. No acute osseous pathology is demonstrated.  Body wall: No subcutaneous masses are seen.      Impression:      1.  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  2.  An indeterminate isodense lesion along the mid left kidney likely represents a cyst with internal debris. Further evaluation with ultrasound or precontrast/postcontrast imaging after the resolution of acute illness is suggested to exclude a solid   lesion.  3.  Additional incidental findings as detailed above.    ReadingStation:[id number]          EKG: none      MDM / Critical Care     Blood pressure 128/89, pulse 69, temperature 97 F (36.1 C), resp. rate 18, height 1.753 m, weight 125.5 kg, SpO2 97  %.    This patient presents to the Emergency Department with flank pain.  The patient underwent evaluation and treatment and initial differential diagnosis included but was not limited to kidney stone, AAA, dissection, musculoskeletal back pain, and other causes of intra-abdominal pathology.  Life-threatening or serious causes of this patients pain were thought unlikely.  The patient underwent evaluation and treatment and their symptoms have improved.  They have no signs of sepsis or acutely worsening kidney failure.  Their pain is controlled and they are able to keep fluids down. The patient was warned to return immediately for fever/chills, intractable pain, persistent vomiting, worsening symptoms or any concerns.  Oral hydration and close re-evaluation with a specialist is recommended.  Diagnostic impression and plan were discussed with the patient and/or family.  Results of lab/radiology tests were discussed with the patient and/or family.  All questions were answered and concerns addressed.  ED Course as of 06/25/20 1242   Wed Jun 24, 2020   1335 Medications Rx for attending at requested dosing as he is experiencing technological answer. [JL]      ED Course User Index  [JL] Long, Alric Seton, NP   Patient had a significant provement in the emergency department.  The patient was without further complaint.  His pain had improved/resolved while here.  He will follow-up with Dr. Donnajean Lopes urology.  Told return for any symptom new concerns were symptoms or concerns.  He voiced understand all the above and thanked [place] for his care.  He felt comfortable to plan he was placed on Norco Flomax Zofran.  He still take Toradol as needed with food.  We asked if there are any further concerns which not addressed and again he stated no.  Procedures             Diagnosis / Disposition     Clinical Impression  1. Renal colic    2. Ureterolithiasis        Disposition  ED Disposition     ED Disposition Condition Date/Time  Comment    Discharge  Wed Jun 24, 2020  1:17 PM [name] discharge to home/self care.    Condition at disposition: Stable          Prescriptions  Discharge Medication List as of 06/24/2020  1:18 PM      START taking these medications    Details   ondansetron (ZOFRAN-ODT) 4 MG disintegrating tablet Take 1 tablet (4 mg total) by mouth every 8 (eight) hours as needed for Nausea, Starting Wed 06/24/2020, Until Sat 06/27/2020 at 2359, E-Rx      tamsulosin (FLOMAX) 0.4 MG Cap Take 1 capsule (0.4 mg total) by mouth Daily after dinner, Starting Wed 06/24/2020, Normal      HYDROcodone-acetaminophen (NORCO) 5-325 MG per tablet Take 1-2 tablets by mouth every 6 (six) hours as needed for Pain (severe pain), Starting Wed 06/24/2020, Print                      Joesphine Bare, MD  06/25/20 1241       Joesphine Bare, MD  06/25/20 1242

## 2020-07-13 ENCOUNTER — Other Ambulatory Visit: Payer: Self-pay

## 2020-07-13 ENCOUNTER — Other Ambulatory Visit (INDEPENDENT_AMBULATORY_CARE_PROVIDER_SITE_OTHER): Payer: Managed Care, Other (non HMO)

## 2020-07-13 DIAGNOSIS — E119 Type 2 diabetes mellitus without complications: Secondary | ICD-10-CM

## 2020-07-13 DIAGNOSIS — I152 Hypertension secondary to endocrine disorders: Secondary | ICD-10-CM

## 2020-07-13 DIAGNOSIS — Z23 Encounter for immunization: Secondary | ICD-10-CM

## 2020-07-13 NOTE — Progress Notes (Signed)
Pt here for influenza vaccine.  Screening questionnaire reviewed, VIS provided to patient, and any/all patient questions answered.  T. Elsie Sakuma, CMA  

## 2020-07-14 LAB — COMPREHENSIVE METABOLIC PANEL
ALT: 35 IU/L (ref 0–44)
AST: 20 IU/L (ref 0–40)
Albumin/Globulin Ratio: 2.1 (ref 1.2–2.2)
Albumin: 4.9 g/dL (ref 3.8–4.9)
Alkaline Phosphatase: 72 IU/L (ref 44–121)
BUN/Creatinine Ratio: 15 (ref 10–24)
BUN: 13 mg/dL (ref 8–27)
Bilirubin Total: 0.5 mg/dL (ref 0.0–1.2)
CO2: 18 mmol/L — ABNORMAL LOW (ref 20–29)
Calcium: 9.3 mg/dL (ref 8.6–10.2)
Chloride: 103 mmol/L (ref 96–106)
Creatinine, Ser: 0.84 mg/dL (ref 0.76–1.27)
GFR calc Af Amer: 110 mL/min/{1.73_m2} (ref >59)
GFR calc non Af Amer: 95 mL/min/{1.73_m2} (ref >59)
Globulin, Total: 2.3 g/dL (ref 1.5–4.5)
Glucose: 154 mg/dL — ABNORMAL HIGH (ref 65–99)
Potassium: 4.3 mmol/L (ref 3.5–5.2)
Sodium: 140 mmol/L (ref 134–144)
Total Protein: 7.2 g/dL (ref 6.0–8.5)

## 2020-07-14 LAB — HEMOGLOBIN A1C
Est. average glucose Bld gHb Est-mCnc: 171 mg/dL
Hgb A1c MFr Bld: 7.6 % — ABNORMAL HIGH (ref 4.8–5.6)

## 2020-07-14 LAB — LIPID PANEL
Chol/HDL Ratio: 2.7 ratio (ref 0.0–5.0)
Cholesterol, Total: 129 mg/dL (ref 100–199)
HDL: 48 mg/dL (ref >39)
LDL Chol Calc (NIH): 58 mg/dL (ref 0–99)
Triglycerides: 128 mg/dL (ref 0–149)
VLDL Cholesterol Cal: 23 mg/dL (ref 5–40)

## 2020-10-07 ENCOUNTER — Telehealth: Payer: Self-pay | Admitting: Physician Assistant

## 2020-10-07 NOTE — Telephone Encounter (Signed)
Patient went to [facility] asking for something over the counter for neuropathy. They did not have anything over the counter and patient came in requesting if something can be called in for neuropathy, thanks. Patient is also on the schedule for Feb.21 st.

## 2020-10-07 NOTE — Telephone Encounter (Signed)
Spoke with Kandis Cocking who advised patient would need to be seen first before prescribing medication for this issue. Patient made aware of this and verbalized understanding. Patient offered apt to Neurology and declined. AS, CMA

## 2020-10-26 ENCOUNTER — Encounter: Payer: Self-pay | Admitting: Physician Assistant

## 2020-10-26 ENCOUNTER — Other Ambulatory Visit: Payer: Self-pay

## 2020-10-26 ENCOUNTER — Ambulatory Visit (INDEPENDENT_AMBULATORY_CARE_PROVIDER_SITE_OTHER): Payer: Managed Care, Other (non HMO) | Admitting: Physician Assistant

## 2020-10-26 VITALS — BP 106/57 | HR 64 | Temp 97.8°F | Ht 72.0 in | Wt 242.6 lb

## 2020-10-26 DIAGNOSIS — E1169 Type 2 diabetes mellitus with other specified complication: Secondary | ICD-10-CM

## 2020-10-26 DIAGNOSIS — F4323 Adjustment disorder with mixed anxiety and depressed mood: Secondary | ICD-10-CM | POA: Diagnosis not present

## 2020-10-26 DIAGNOSIS — E1159 Type 2 diabetes mellitus with other circulatory complications: Secondary | ICD-10-CM

## 2020-10-26 DIAGNOSIS — Z6832 Body mass index (BMI) 32.0-32.9, adult: Secondary | ICD-10-CM

## 2020-10-26 DIAGNOSIS — I152 Hypertension secondary to endocrine disorders: Secondary | ICD-10-CM

## 2020-10-26 LAB — POCT GLYCOSYLATED HEMOGLOBIN (HGB A1C): Hemoglobin A1C: 6.7 % — AB (ref 4.0–5.6)
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
## 2020-10-26 NOTE — Assessment & Plan Note (Signed)
-  Controlled. -Continue current medication regimen. -Follow low sodium diet and stay well hydrated. -Will continue to monitor.

## 2020-10-26 NOTE — Assessment & Plan Note (Signed)
-  PHQ-9 score of 0. Denies SI/HI. -Continue current medication regimen.

## 2020-10-26 NOTE — Patient Instructions (Signed)
Diabetes Mellitus and Nutrition, Adult When you have diabetes, or diabetes mellitus, it is very important to have healthy eating habits because your blood sugar (glucose) levels are greatly affected by what you eat and drink. Eating healthy foods in the right amounts, at about the same times every day, can help you:  Control your blood glucose.  Lower your risk of heart disease.  Improve your blood pressure.  Reach or maintain a healthy weight. What can affect my meal plan? Every person with diabetes is different, and each person has different needs for a meal plan. Your health care provider may recommend that you work with a dietitian to make a meal plan that is best for you. Your meal plan may vary depending on factors such as:  The calories you need.  The medicines you take.  Your weight.  Your blood glucose, blood pressure, and cholesterol levels.  Your activity level.  Other health conditions you have, such as heart or kidney disease. How do carbohydrates affect me? Carbohydrates, also called carbs, affect your blood glucose level more than any other type of food. Eating carbs naturally raises the amount of glucose in your blood. Carb counting is a method for keeping track of how many carbs you eat. Counting carbs is important to keep your blood glucose at a healthy level, especially if you use insulin or take certain oral diabetes medicines. It is important to know how many carbs you can safely have in each meal. This is different for every person. Your dietitian can help you calculate how many carbs you should have at each meal and for each snack. How does alcohol affect me? Alcohol can cause a sudden decrease in blood glucose (hypoglycemia), especially if you use insulin or take certain oral diabetes medicines. Hypoglycemia can be a life-threatening condition. Symptoms of hypoglycemia, such as sleepiness, dizziness, and confusion, are similar to symptoms of having too much  alcohol.  Do not drink alcohol if: ? Your health care provider tells you not to drink. ? You are pregnant, may be pregnant, or are planning to become pregnant.  If you drink alcohol: ? Do not drink on an empty stomach. ? Limit how much you use to:  0-1 drink a day for women.  0-2 drinks a day for men. ? Be aware of how much alcohol is in your drink. In the [place] one drink equals one 12 oz bottle of beer (355 mL), one 5 oz glass of wine (148 mL), or one 1 oz glass of hard liquor (44 mL). ? Keep yourself hydrated with water, diet soda, or unsweetened iced tea.  Keep in mind that regular soda, juice, and other mixers may contain a lot of sugar and must be counted as carbs. What are tips for following this plan? Reading food labels  Start by checking the serving size on the "Nutrition Facts" label of packaged foods and drinks. The amount of calories, carbs, fats, and other nutrients listed on the label is based on one serving of the item. Many items contain more than one serving per package.  Check the total grams (g) of carbs in one serving. You can calculate the number of servings of carbs in one serving by dividing the total carbs by 15. For example, if a food has 30 g of total carbs per serving, it would be equal to 2 servings of carbs.  Check the number of grams (g) of saturated fats and trans fats in one serving. Choose foods that have   a low amount or none of these fats.  Check the number of milligrams (mg) of salt (sodium) in one serving. Most people should limit total sodium intake to less than 2,300 mg per day.  Always check the nutrition information of foods labeled as "low-fat" or "nonfat." These foods may be higher in added sugar or refined carbs and should be avoided.  Talk to your dietitian to identify your daily goals for nutrients listed on the label. Shopping  Avoid buying canned, pre-made, or processed foods. These foods tend to be high in fat, sodium, and added  sugar.  Shop around the outside edge of the grocery store. This is where you will most often find fresh fruits and vegetables, bulk grains, fresh meats, and fresh dairy. Cooking  Use low-heat cooking methods, such as baking, instead of high-heat cooking methods like deep frying.  Cook using healthy oils, such as olive, canola, or sunflower oil.  Avoid cooking with butter, cream, or high-fat meats. Meal planning  Eat meals and snacks regularly, preferably at the same times every day. Avoid going long periods of time without eating.  Eat foods that are high in fiber, such as fresh fruits, vegetables, beans, and whole grains. Talk with your dietitian about how many servings of carbs you can eat at each meal.  Eat 4-6 oz (112-168 g) of lean protein each day, such as lean meat, chicken, fish, eggs, or tofu. One ounce (oz) of lean protein is equal to: ? 1 oz (28 g) of meat, chicken, or fish. ? 1 egg. ?  cup (62 g) of tofu.  Eat some foods each day that contain healthy fats, such as avocado, nuts, seeds, and fish.   What foods should I eat? Fruits Berries. Apples. Oranges. Peaches. Apricots. Plums. Grapes. Mango. Papaya. Pomegranate. Kiwi. Cherries. Vegetables Lettuce. Spinach. Leafy greens, including kale, chard, collard greens, and mustard greens. Beets. Cauliflower. Cabbage. Broccoli. Carrots. Green beans. Tomatoes. Peppers. Onions. Cucumbers. Brussels sprouts. Grains Whole grains, such as whole-wheat or whole-grain bread, crackers, tortillas, cereal, and pasta. Unsweetened oatmeal. Quinoa. Brown or wild rice. Meats and other proteins Seafood. Poultry without skin. Lean cuts of poultry and beef. Tofu. Nuts. Seeds. Dairy Low-fat or fat-free dairy products such as milk, yogurt, and cheese. The items listed above may not be a complete list of foods and beverages you can eat. Contact a dietitian for more information. What foods should I avoid? Fruits Fruits canned with  syrup. Vegetables Canned vegetables. Frozen vegetables with butter or cream sauce. Grains Refined white flour and flour products such as bread, pasta, snack foods, and cereals. Avoid all processed foods. Meats and other proteins Fatty cuts of meat. Poultry with skin. Breaded or fried meats. Processed meat. Avoid saturated fats. Dairy Full-fat yogurt, cheese, or milk. Beverages Sweetened drinks, such as soda or iced tea. The items listed above may not be a complete list of foods and beverages you should avoid. Contact a dietitian for more information. Questions to ask a health care provider  Do I need to meet with a diabetes educator?  Do I need to meet with a dietitian?  What number can I call if I have questions?  When are the best times to check my blood glucose? Where to find more information:  American Diabetes Association: diabetes.org  Academy of Nutrition and Dietetics: www.eatright.org  National Institute of Diabetes and Digestive and Kidney Diseases: www.niddk.nih.gov  Association of Diabetes Care and Education Specialists: www.diabeteseducator.org Summary  It is important to have healthy eating   habits because your blood sugar (glucose) levels are greatly affected by what you eat and drink.  A healthy meal plan will help you control your blood glucose and maintain a healthy lifestyle.  Your health care provider may recommend that you work with a dietitian to make a meal plan that is best for you.  Keep in mind that carbohydrates (carbs) and alcohol have immediate effects on your blood glucose levels. It is important to count carbs and to use alcohol carefully. This information is not intended to replace advice given to you by your health care provider. Make sure you discuss any questions you have with your health care provider. Document Revised: 07/30/2019 Document Reviewed: 07/30/2019 Elsevier Patient Education  2021 Elsevier Inc.  

## 2020-12-21 ENCOUNTER — Other Ambulatory Visit: Payer: Self-pay | Admitting: Physician Assistant

## 2020-12-21 DIAGNOSIS — F430 Acute stress reaction: Secondary | ICD-10-CM

## 2020-12-25 ENCOUNTER — Other Ambulatory Visit: Payer: Self-pay | Admitting: Physician Assistant

## 2020-12-25 ENCOUNTER — Telehealth: Payer: Self-pay | Admitting: Physician Assistant

## 2020-12-25 DIAGNOSIS — R9431 Abnormal electrocardiogram [ECG] [EKG]: Secondary | ICD-10-CM

## 2020-12-25 NOTE — Telephone Encounter (Signed)
Pt called office stating he has cataract surgery this morning and had an EKG done before the surgery that showed a left bundle branch block.   Patient denies chest pain, SOB, left arm pain or tingling, neck pain, confusion or dizziness.   Per Kandis Cocking placed a referral to Cardiology. Pt has been advised to go to ED should he develop SOB, chest pain, left arm pain or tingling, neck pain, confusion or dizziness. Pt verbalized understanding and was agreeable. Kandis Cocking is aware of the situation. AS, CMA

## 2020-12-30 ENCOUNTER — Telehealth: Payer: Self-pay | Admitting: Physician Assistant

## 2020-12-30 NOTE — Telephone Encounter (Signed)
Please look into this referral placed on 12/25/20. AS, CMA

## 2020-12-30 NOTE — Telephone Encounter (Signed)
Patient would like an update on his cardiology referral as he has not heard anything yet. I believe it was placed at [facility] on [facility]. I advised patient he possibly may have missed their call. Please advise. Thanks.

## 2021-01-11 ENCOUNTER — Telehealth: Payer: Self-pay | Admitting: Physician Assistant

## 2021-01-11 NOTE — Telephone Encounter (Signed)
Nathan Kelley from [facility] states there is no EKG in the referral placed on April 23 rd. Please advise, thanks.

## 2021-01-18 ENCOUNTER — Other Ambulatory Visit: Payer: Self-pay | Admitting: Physician Assistant

## 2021-01-18 DIAGNOSIS — E119 Type 2 diabetes mellitus without complications: Secondary | ICD-10-CM

## 2021-01-18 DIAGNOSIS — Z0000 Encounter for general adult medical examination without abnormal findings: Secondary | ICD-10-CM

## 2021-01-18 DIAGNOSIS — E1169 Type 2 diabetes mellitus with other specified complication: Secondary | ICD-10-CM

## 2021-01-18 DIAGNOSIS — I152 Hypertension secondary to endocrine disorders: Secondary | ICD-10-CM

## 2021-01-19 ENCOUNTER — Other Ambulatory Visit: Payer: Managed Care, Other (non HMO)

## 2021-01-19 ENCOUNTER — Other Ambulatory Visit: Payer: Self-pay

## 2021-01-19 DIAGNOSIS — E1169 Type 2 diabetes mellitus with other specified complication: Secondary | ICD-10-CM

## 2021-01-19 DIAGNOSIS — I152 Hypertension secondary to endocrine disorders: Secondary | ICD-10-CM

## 2021-01-19 DIAGNOSIS — E119 Type 2 diabetes mellitus without complications: Secondary | ICD-10-CM

## 2021-01-19 DIAGNOSIS — Z0000 Encounter for general adult medical examination without abnormal findings: Secondary | ICD-10-CM

## 2021-01-19 DIAGNOSIS — E1159 Type 2 diabetes mellitus with other circulatory complications: Secondary | ICD-10-CM

## 2021-01-20 ENCOUNTER — Telehealth: Payer: Self-pay | Admitting: Physician Assistant

## 2021-01-20 LAB — COMPREHENSIVE METABOLIC PANEL
ALT: 27 IU/L (ref 0–44)
AST: 24 IU/L (ref 0–40)
Albumin/Globulin Ratio: 2.3 — ABNORMAL HIGH (ref 1.2–2.2)
Albumin: 4.8 g/dL (ref 3.8–4.8)
Alkaline Phosphatase: 77 IU/L (ref 44–121)
BUN/Creatinine Ratio: 17 (ref 10–24)
BUN: 14 mg/dL (ref 8–27)
Bilirubin Total: 0.7 mg/dL (ref 0.0–1.2)
CO2: 22 mmol/L (ref 20–29)
Calcium: 9.2 mg/dL (ref 8.6–10.2)
Chloride: 100 mmol/L (ref 96–106)
Creatinine, Ser: 0.83 mg/dL (ref 0.76–1.27)
Globulin, Total: 2.1 g/dL (ref 1.5–4.5)
Glucose: 150 mg/dL — ABNORMAL HIGH (ref 65–99)
Potassium: 4.5 mmol/L (ref 3.5–5.2)
Sodium: 138 mmol/L (ref 134–144)
Total Protein: 6.9 g/dL (ref 6.0–8.5)
eGFR: 100 mL/min/{1.73_m2} (ref >59)

## 2021-01-20 LAB — CBC
Hematocrit: 47.5 % (ref 37.5–51.0)
Hemoglobin: 16.9 g/dL (ref 13.0–17.7)
MCH: 31.8 pg (ref 26.6–33.0)
MCHC: 35.6 g/dL (ref 31.5–35.7)
MCV: 89 fL (ref 79–97)
Platelets: 253 10*3/uL (ref 150–450)
RBC: 5.32 x10E6/uL (ref 4.14–5.80)
RDW: 12.7 % (ref 11.6–15.4)
WBC: 9.3 10*3/uL (ref 3.4–10.8)

## 2021-01-20 LAB — LIPID PANEL
Chol/HDL Ratio: 2.5 ratio (ref 0.0–5.0)
Cholesterol, Total: 122 mg/dL (ref 100–199)
HDL: 48 mg/dL (ref >39)
LDL Chol Calc (NIH): 47 mg/dL (ref 0–99)
Triglycerides: 162 mg/dL — ABNORMAL HIGH (ref 0–149)
VLDL Cholesterol Cal: 27 mg/dL (ref 5–40)

## 2021-01-20 LAB — TSH: TSH: 3.67 u[IU]/mL (ref 0.450–4.500)

## 2021-01-20 LAB — HEMOGLOBIN A1C
Est. average glucose Bld gHb Est-mCnc: 157 mg/dL
Hgb A1c MFr Bld: 7.1 % — ABNORMAL HIGH (ref 4.8–5.6)

## 2021-01-20 NOTE — Telephone Encounter (Signed)
Left voicemail for patient making him aware.

## 2021-01-20 NOTE — Telephone Encounter (Signed)
Patient says his blood sugar is up as he noticed from his labs and would like his diabetic medication called into [facility], thanks. I believe it might be the Ambulatory non formulary medication is what he is referring to.

## 2021-01-20 NOTE — Telephone Encounter (Signed)
Patient was advised that PCP will discuss at next appointment visit. Thank you

## 2021-01-20 NOTE — Telephone Encounter (Signed)
Please refer to previous message

## 2021-01-20 NOTE — Telephone Encounter (Signed)
Nathan Kelley advised you that she would discuss the patients labs with Nathan Kelley at his upcoming apt on 01/26/21. Did you relay this information to the patient?

## 2021-01-26 ENCOUNTER — Other Ambulatory Visit: Payer: Self-pay

## 2021-01-26 ENCOUNTER — Encounter: Payer: Self-pay | Admitting: Physician Assistant

## 2021-01-26 ENCOUNTER — Ambulatory Visit (INDEPENDENT_AMBULATORY_CARE_PROVIDER_SITE_OTHER): Payer: PRIVATE HEALTH INSURANCE | Admitting: Physician Assistant

## 2021-01-26 VITALS — BP 136/75 | HR 72 | Temp 98.1°F | Ht 72.0 in | Wt 241.7 lb

## 2021-01-26 DIAGNOSIS — Z0000 Encounter for general adult medical examination without abnormal findings: Secondary | ICD-10-CM

## 2021-01-26 DIAGNOSIS — E1169 Type 2 diabetes mellitus with other specified complication: Secondary | ICD-10-CM | POA: Diagnosis not present

## 2021-01-26 LAB — POCT UA - MICROALBUMIN

## 2021-01-26 MED ORDER — METFORMIN HCL 500 MG PO TABS: 500.0000 mg | ORAL_TABLET | Freq: Two times a day (BID) | ORAL | 1 refills | Status: DC
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
## 2021-01-26 NOTE — Patient Instructions (Signed)
Heart-Healthy Eating Plan Heart-healthy meal planning includes:  Eating less unhealthy fats.  Eating more healthy fats.  Making other changes in your diet. Talk with your doctor or a diet specialist (dietitian) to create an eating plan that is right for you. What is my plan? Your doctor may recommend an eating plan that includes:  Total fat: ______% or less of total calories a day.  Saturated fat: ______% or less of total calories a day.  Cholesterol: less than _________mg a day. What are tips for following this plan? Cooking Avoid frying your food. Try to bake, boil, grill, or broil it instead. You can also reduce fat by:  Removing the skin from poultry.  Removing all visible fats from meats.  Steaming vegetables in water or broth. Meal planning  At meals, divide your plate into four equal parts: ? Fill one-half of your plate with vegetables and green salads. ? Fill one-fourth of your plate with whole grains. ? Fill one-fourth of your plate with lean protein foods.  Eat 4-5 servings of vegetables per day. A serving of vegetables is: ? 1 cup of raw or cooked vegetables. ? 2 cups of raw leafy greens.  Eat 4-5 servings of fruit per day. A serving of fruit is: ? 1 medium whole fruit. ?  cup of dried fruit. ?  cup of fresh, frozen, or canned fruit. ?  cup of 100% fruit juice.  Eat more foods that have soluble fiber. These are apples, broccoli, carrots, beans, peas, and barley. Try to get 20-30 g of fiber per day.  Eat 4-5 servings of nuts, legumes, and seeds per week: ? 1 serving of dried beans or legumes equals  cup after being cooked. ? 1 serving of nuts is  cup. ? 1 serving of seeds equals 1 tablespoon.   General information  Eat more home-cooked food. Eat less restaurant, buffet, and fast food.  Limit or avoid alcohol.  Limit foods that are high in starch and sugar.  Avoid fried foods.  Lose weight if you are overweight.  Keep track of how much salt  (sodium) you eat. This is important if you have high blood pressure. Ask your doctor to tell you more about this.  Try to add vegetarian meals each week. Fats  Choose healthy fats. These include olive oil and canola oil, flaxseeds, walnuts, almonds, and seeds.  Eat more omega-3 fats. These include salmon, mackerel, sardines, tuna, flaxseed oil, and ground flaxseeds. Try to eat fish at least 2 times each week.  Check food labels. Avoid foods with trans fats or high amounts of saturated fat.  Limit saturated fats. ? These are often found in animal products, such as meats, butter, and cream. ? These are also found in plant foods, such as palm oil, palm kernel oil, and coconut oil.  Avoid foods with partially hydrogenated oils in them. These have trans fats. Examples are stick margarine, some tub margarines, cookies, crackers, and other baked goods. What foods can I eat? Fruits All fresh, canned (in natural juice), or frozen fruits. Vegetables Fresh or frozen vegetables (raw, steamed, roasted, or grilled). Green salads. Grains Most grains. Choose whole wheat and whole grains most of the time. Rice and pasta, including brown rice and pastas made with whole wheat. Meats and other proteins Lean, well-trimmed beef, veal, pork, and lamb. Chicken and turkey without skin. All fish and shellfish. Wild duck, rabbit, pheasant, and venison. Egg whites or low-cholesterol egg substitutes. Dried beans, peas, lentils, and tofu. Seeds and   most nuts. Dairy Low-fat or nonfat cheeses, including ricotta and mozzarella. Skim or 1% milk that is liquid, powdered, or evaporated. Buttermilk that is made with low-fat milk. Nonfat or low-fat yogurt. Fats and oils Non-hydrogenated (trans-free) margarines. Vegetable oils, including soybean, sesame, sunflower, olive, peanut, safflower, corn, canola, and cottonseed. Salad dressings or mayonnaise made with a vegetable oil. Beverages Mineral water. Coffee and tea. Diet  carbonated beverages. Sweets and desserts Sherbet, gelatin, and fruit ice. Small amounts of dark chocolate. Limit all sweets and desserts. Seasonings and condiments All seasonings and condiments. The items listed above may not be a complete list of foods and drinks you can eat. Contact a dietitian for more options. What foods should I avoid? Fruits Canned fruit in heavy syrup. Fruit in cream or butter sauce. Fried fruit. Limit coconut. Vegetables Vegetables cooked in cheese, cream, or butter sauce. Fried vegetables. Grains Breads that are made with saturated or trans fats, oils, or whole milk. Croissants. Sweet rolls. Donuts. High-fat crackers, such as cheese crackers. Meats and other proteins Fatty meats, such as hot dogs, ribs, sausage, bacon, rib-eye roast or steak. High-fat deli meats, such as salami and bologna. Caviar. Domestic duck and goose. Organ meats, such as liver. Dairy Cream, sour cream, cream cheese, and creamed cottage cheese. Whole-milk cheeses. Whole or 2% milk that is liquid, evaporated, or condensed. Whole buttermilk. Cream sauce or high-fat cheese sauce. Yogurt that is made from whole milk. Fats and oils Meat fat, or shortening. Cocoa butter, hydrogenated oils, palm oil, coconut oil, palm kernel oil. Solid fats and shortenings, including bacon fat, salt pork, lard, and butter. Nondairy cream substitutes. Salad dressings with cheese or sour cream. Beverages Regular sodas and juice drinks with added sugar. Sweets and desserts Frosting. Pudding. Cookies. Cakes. Pies. Milk chocolate or white chocolate. Buttered syrups. Full-fat ice cream or ice cream drinks. The items listed above may not be a complete list of foods and drinks to avoid. Contact a dietitian for more information. Summary  Heart-healthy meal planning includes eating less unhealthy fats, eating more healthy fats, and making other changes in your diet.  Eat a balanced diet. This includes fruits and  vegetables, low-fat or nonfat dairy, lean protein, nuts and legumes, whole grains, and heart-healthy oils and fats. This information is not intended to replace advice given to you by your health care provider. Make sure you discuss any questions you have with your health care provider. Document Revised: 10/26/2017 Document Reviewed: 09/29/2017 Elsevier Patient Education  2021 Elsevier Inc.   Preventive Care 40-64 Years Old, Male Preventive care refers to lifestyle choices and visits with your health care provider that can promote health and wellness. This includes:  A yearly physical exam. This is also called an annual wellness visit.  Regular dental and eye exams.  Immunizations.  Screening for certain conditions.  Healthy lifestyle choices, such as: ? Eating a healthy diet. ? Getting regular exercise. ? Not using drugs or products that contain nicotine and tobacco. ? Limiting alcohol use. What can I expect for my preventive care visit? Physical exam Your health care provider will check your:  Height and weight. These may be used to calculate your BMI (body mass index). BMI is a measurement that tells if you are at a healthy weight.  Heart rate and blood pressure.  Body temperature.  Skin for abnormal spots. Counseling Your health care provider may ask you questions about your:  Past medical problems.  Family's medical history.  Alcohol, tobacco, and drug use.    Emotional well-being.  Home life and relationship well-being.  Sexual activity.  Diet, exercise, and sleep habits.  Work and work environment.  Access to firearms. What immunizations do I need? Vaccines are usually given at various ages, according to a schedule. Your health care provider will recommend vaccines for you based on your age, medical history, and lifestyle or other factors, such as travel or where you work.   What tests do I need? Blood tests  Lipid and cholesterol levels. These may be  checked every 5 years, or more often if you are over 50 years old.  Hepatitis C test.  Hepatitis B test. Screening  Lung cancer screening. You may have this screening every year starting at age 55 if you have a 30-pack-year history of smoking and currently smoke or have quit within the past 15 years.  Prostate cancer screening. Recommendations will vary depending on your family history and other risks.  Genital exam to check for testicular cancer or hernias.  Colorectal cancer screening. ? All adults should have this screening starting at age 50 and continuing until age 75. ? Your health care provider may recommend screening at age 45 if you are at increased risk. ? You will have tests every 1-10 years, depending on your results and the type of screening test.  Diabetes screening. ? This is done by checking your blood sugar (glucose) after you have not eaten for a while (fasting). ? You may have this done every 1-3 years.  STD (sexually transmitted disease) testing, if you are at risk. Follow these instructions at home: Eating and drinking  Eat a diet that includes fresh fruits and vegetables, whole grains, lean protein, and low-fat dairy products.  Take vitamin and mineral supplements as recommended by your health care provider.  Do not drink alcohol if your health care provider tells you not to drink.  If you drink alcohol: ? Limit how much you have to 0-2 drinks a day. ? Be aware of how much alcohol is in your drink. In the [place] one drink equals one 12 oz bottle of beer (355 mL), one 5 oz glass of wine (148 mL), or one 1 oz glass of hard liquor (44 mL).   Lifestyle  Take daily care of your teeth and gums. Brush your teeth every morning and night with fluoride toothpaste. Floss one time each day.  Stay active. Exercise for at least 30 minutes 5 or more days each week.  Do not use any products that contain nicotine or tobacco, such as cigarettes, e-cigarettes, and chewing  tobacco. If you need help quitting, ask your health care provider.  Do not use drugs.  If you are sexually active, practice safe sex. Use a condom or other form of protection to prevent STIs (sexually transmitted infections).  If told by your health care provider, take low-dose aspirin daily starting at age 50.  Find healthy ways to cope with stress, such as: ? Meditation, yoga, or listening to music. ? Journaling. ? Talking to a trusted person. ? Spending time with friends and family. Safety  Always wear your seat belt while driving or riding in a vehicle.  Do not drive: ? If you have been drinking alcohol. Do not ride with someone who has been drinking. ? When you are tired or distracted. ? While texting.  Wear a helmet and other protective equipment during sports activities.  If you have firearms in your house, make sure you follow all gun safety procedures. What's next?    Go to your health care provider once a year for an annual wellness visit.  Ask your health care provider how often you should have your eyes and teeth checked.  Stay up to date on all vaccines. This information is not intended to replace advice given to you by your health care provider. Make sure you discuss any questions you have with your health care provider. Document Revised: 05/21/2019 Document Reviewed: 08/16/2018 Elsevier Patient Education  2021 Elsevier Inc.  

## 2021-01-28 ENCOUNTER — Telehealth: Payer: Self-pay | Admitting: Physician Assistant

## 2021-01-28 DIAGNOSIS — E1159 Type 2 diabetes mellitus with other circulatory complications: Secondary | ICD-10-CM

## 2021-01-28 DIAGNOSIS — I152 Hypertension secondary to endocrine disorders: Secondary | ICD-10-CM

## 2021-01-28 MED ORDER — LOSARTAN POTASSIUM 100 MG PO TABS: 100.0000 mg | ORAL_TABLET | Freq: Every day | ORAL | 1 refills | Status: DC

## 2021-01-28 NOTE — Telephone Encounter (Signed)
Med sent to pharmacy. AS, CMA 

## 2021-01-28 NOTE — Addendum Note (Signed)
Addended by: Sylvester Harder on: 01/28/2021 08:29 AM   Modules accepted: Orders

## 2021-01-28 NOTE — Telephone Encounter (Signed)
Patient needs a refill on Losartan and uses [facility], thanks.

## 2021-02-15 DIAGNOSIS — R9431 Abnormal electrocardiogram [ECG] [EKG]: Secondary | ICD-10-CM | POA: Insufficient documentation
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
## 2021-02-16 ENCOUNTER — Other Ambulatory Visit: Payer: Self-pay | Admitting: Cardiology

## 2021-02-16 ENCOUNTER — Telehealth: Payer: Self-pay | Admitting: Cardiology

## 2021-02-16 ENCOUNTER — Other Ambulatory Visit: Payer: Self-pay

## 2021-02-16 ENCOUNTER — Telehealth: Payer: Self-pay | Admitting: *Deleted

## 2021-02-16 ENCOUNTER — Encounter: Payer: Self-pay | Admitting: Cardiology

## 2021-02-16 ENCOUNTER — Ambulatory Visit (INDEPENDENT_AMBULATORY_CARE_PROVIDER_SITE_OTHER): Payer: PRIVATE HEALTH INSURANCE | Admitting: Cardiology

## 2021-02-16 VITALS — BP 136/76 | HR 80 | Wt 241.8 lb

## 2021-02-16 DIAGNOSIS — R0609 Other forms of dyspnea: Secondary | ICD-10-CM

## 2021-02-16 DIAGNOSIS — R0600 Dyspnea, unspecified: Secondary | ICD-10-CM

## 2021-02-16 DIAGNOSIS — R9431 Abnormal electrocardiogram [ECG] [EKG]: Secondary | ICD-10-CM | POA: Diagnosis not present

## 2021-02-16 MED ORDER — METOPROLOL TARTRATE 100 MG PO TABS: 100.0000 mg | ORAL_TABLET | Freq: Once | ORAL | 0 refills | Status: DC

## 2021-02-16 NOTE — Telephone Encounter (Signed)
Pt c/o medication issue:  1. Name of Medication: metoprolol tartrate (LOPRESSOR) 100 MG tablet   2. How are you currently taking this medication (dosage and times per day)?   3. Are you having a reaction (difficulty breathing--STAT)?   4. What is your medication issue? [facility] is requesting clarification on medication instructions. They would like to know how soon patient needs to take Metoprolol prior to CT.

## 2021-02-16 NOTE — Patient Instructions (Addendum)
Medication Instructions:  See below instructions  *If you need a refill on your cardiac medications before your next appointment, please call your pharmacy*   Lab Work: BMP BNP -Today  If you have labs (blood work) drawn today and your tests are completely normal, you will receive your results only by: MyChart Message (if you have MyChart) OR A paper copy in the mail If you have any lab test that is abnormal or we need to change your treatment, we will call you to review the results.   Testing/Procedures: Will be schedule at [facility] radiology once  authorization form your insurance Your physician has requested that you have cardiac CTA. Cardiac computed tomography (CT) is a painless test that uses an x-ray machine to take clear, detailed pictures of your heart. Please follow instruction sheet as given.     Follow-Up: At [facility], you and your health needs are our priority.  As part of our continuing mission to provide you with exceptional heart care, we have created designated Provider Care Teams.  These Care Teams include your primary Cardiologist (physician) and Advanced Practice Providers (APPs -  Physician Assistants and Nurse Practitioners) who all work together to provide you with the care you need, when you need it.  We recommend signing up for the patient portal called "MyChart".  Sign up information is provided on this After Visit Summary.  MyChart is used to connect with patients for Virtual Visits (Telemedicine).  Patients are able to view lab/test results, encounter notes, upcoming appointments, etc.  Non-urgent messages can be sent to your provider as well.   To learn more about what you can do with MyChart, go to [email or web address].    Your next appointment:   2 month(s)  The format for your next appointment:   In Person  Provider:   Rollene Rotunda, MD   Other Instructions   Your cardiac CT will be scheduled at the below locations:   [facility] [address] [place], [place] [address] [phone]     Please arrive at the [facility] main entrance (entrance A) of [facility] 30 minutes prior to test start time. Proceed to the [facility] Radiology Department (first floor) to check-in and test prep.    Please follow these instructions carefully (unless otherwise directed):   Labs hVE BEEN DONE TODAY 02/16/21  On the Night Before the Test: Be sure to Drink plenty of water. Do not consume any caffeinated/decaffeinated beverages or chocolate 12 hours prior to your test. Do not take any antihistamines 12 hours prior to your test.  On the Day of the Test: Drink plenty of water until 1 hour prior to the test. Do not eat any food 4 hours prior to the test. You may take your regular medications prior to the test.  Take metoprolol (Lopressor)  100 mg two hours prior to test.       After the Test: Drink plenty of water. After receiving IV contrast, you may experience a mild flushed feeling. This is normal. On occasion, you may experience a mild rash up to 24 hours after the test. This is not dangerous. If this occurs, you can take Benadryl 25 mg and increase your fluid intake. If you experience trouble breathing, this can be serious. If it is severe call 911 IMMEDIATELY. If it is mild, please call our office. If you take any of these medications: Metformin, please do not take 48 hours after completing test unless otherwise instructed.  Once we have confirmed authorization from your insurance company, we will call you to set up a date and time for your test. Based on how quickly your insurance processes prior authorizations requests, please allow up to 4 weeks to be contacted for scheduling your Cardiac CT appointment. Be advised that routine Cardiac CT appointments could be scheduled as many as 8 weeks after your provider has ordered it.  For non-scheduling related questions, please contact the cardiac  imaging nurse navigator should you have any questions/concerns: Rockwell Alexandria, Cardiac Imaging Nurse Navigator Larey Brick, Cardiac Imaging Nurse Navigator [facility] Direct Office Dial: [phone]   For scheduling needs, including cancellations and rescheduling, please call [place], [phone].

## 2021-02-16 NOTE — Telephone Encounter (Signed)
Called to inform patient about the instruction for CCTA. Patient left office once lab work was obtained.  Patient aware written instruction will be sent by mail to him Patient verbalized understanding

## 2021-02-16 NOTE — Telephone Encounter (Signed)
Confirmed with pharmacy staff that pt should take lopressor 2 hour prior to coronary CTA. Staff member verbalizes understanding.

## 2021-02-17 LAB — BASIC METABOLIC PANEL
BUN/Creatinine Ratio: 14 (ref 10–24)
BUN: 11 mg/dL (ref 8–27)
CO2: 22 mmol/L (ref 20–29)
Calcium: 9.7 mg/dL (ref 8.6–10.2)
Chloride: 102 mmol/L (ref 96–106)
Creatinine, Ser: 0.81 mg/dL (ref 0.76–1.27)
Glucose: 176 mg/dL — ABNORMAL HIGH (ref 65–99)
Potassium: 4.4 mmol/L (ref 3.5–5.2)
Sodium: 140 mmol/L (ref 134–144)
eGFR: 100 mL/min/{1.73_m2} (ref >59)

## 2021-02-17 LAB — BRAIN NATRIURETIC PEPTIDE: BNP: 12 pg/mL (ref 0.0–100.0)

## 2021-02-18 NOTE — Addendum Note (Signed)
Addended by: Derenda Fennel on: 02/18/2021 04:30 PM   Modules accepted: Orders

## 2021-02-25 ENCOUNTER — Telehealth (HOSPITAL_COMMUNITY): Payer: Self-pay | Admitting: *Deleted

## 2021-02-25 NOTE — Telephone Encounter (Signed)
Reaching out to patient to offer assistance regarding upcoming cardiac imaging study; pt verbalizes understanding of appt date/time, parking situation and where to check in, pre-test NPO status and medications ordered, and verified current allergies; name and call back number provided for further questions should they arise  Casten Floren RN Navigator Cardiac Imaging [facility] [phone] office [phone] cell  Pt to take 100mg metoprolol tartrate 2 hours prior to cardiac CT scan. 

## 2021-03-01 ENCOUNTER — Telehealth: Payer: Self-pay | Admitting: Cardiology

## 2021-03-01 ENCOUNTER — Other Ambulatory Visit: Payer: Self-pay

## 2021-03-01 ENCOUNTER — Ambulatory Visit (HOSPITAL_COMMUNITY)
Admission: RE | Admit: 2021-03-01 | Discharge: 2021-03-01 | Disposition: A | Discharge status: Home / Self Care | Payer: PRIVATE HEALTH INSURANCE | Source: Ambulatory Visit | Attending: Cardiology | Admitting: Cardiology

## 2021-03-01 ENCOUNTER — Encounter (HOSPITAL_COMMUNITY): Payer: Self-pay

## 2021-03-01 DIAGNOSIS — R0600 Dyspnea, unspecified: Secondary | ICD-10-CM | POA: Diagnosis present

## 2021-03-01 DIAGNOSIS — R0609 Other forms of dyspnea: Secondary | ICD-10-CM

## 2021-03-01 DIAGNOSIS — R9431 Abnormal electrocardiogram [ECG] [EKG]: Secondary | ICD-10-CM | POA: Insufficient documentation

## 2021-03-01 MED ORDER — NITROGLYCERIN 0.4 MG SL SUBL: 0.8000 mg | SUBLINGUAL_TABLET | Freq: Once | SUBLINGUAL | Status: AC

## 2021-03-01 MED ORDER — NITROGLYCERIN 0.4 MG SL SUBL
SUBLINGUAL_TABLET | SUBLINGUAL | Status: AC
  Administered 2021-03-01: 0.8 mg via SUBLINGUAL
  Filled 2021-03-01: qty 2

## 2021-03-01 MED ORDER — IOHEXOL 350 MG/ML SOLN
95.0000 mL | Freq: Once | INTRAVENOUS | Status: AC | PRN
  Administered 2021-03-01: 95 mL via INTRAVENOUS

## 2021-03-01 NOTE — Telephone Encounter (Signed)
Pt is calling in regards to his CT results from this morning, pt states he received a notification on MyChart but he is unable to understand it. Please advise pt further

## 2021-03-01 NOTE — Telephone Encounter (Signed)
This RN called patient back, explained that the results released to MyChart have not yet been reviewed by Dr. Antoine Poche. Explained that Dr. Jenene Slicker result note will explain the results and give any recommendations if needed. This RN told patient he would receive a call or MyChart message when his result is reviewed by Dr. Antoine Poche. Pt verbalized understanding.

## 2021-03-30 ENCOUNTER — Other Ambulatory Visit: Payer: Self-pay | Admitting: Physician Assistant

## 2021-03-30 DIAGNOSIS — F430 Acute stress reaction: Secondary | ICD-10-CM

## 2021-04-28 ENCOUNTER — Ambulatory Visit (INDEPENDENT_AMBULATORY_CARE_PROVIDER_SITE_OTHER): Payer: PRIVATE HEALTH INSURANCE | Admitting: Physician Assistant

## 2021-04-28 ENCOUNTER — Encounter: Payer: Self-pay | Admitting: Physician Assistant

## 2021-04-28 ENCOUNTER — Other Ambulatory Visit: Payer: Self-pay

## 2021-04-28 VITALS — BP 136/84 | HR 76 | Temp 97.6°F | Ht 72.0 in | Wt 240.9 lb

## 2021-04-28 DIAGNOSIS — E1169 Type 2 diabetes mellitus with other specified complication: Secondary | ICD-10-CM

## 2021-04-28 DIAGNOSIS — E781 Pure hyperglyceridemia: Secondary | ICD-10-CM

## 2021-04-28 DIAGNOSIS — F4323 Adjustment disorder with mixed anxiety and depressed mood: Secondary | ICD-10-CM

## 2021-04-28 DIAGNOSIS — E1159 Type 2 diabetes mellitus with other circulatory complications: Secondary | ICD-10-CM

## 2021-04-28 DIAGNOSIS — I152 Hypertension secondary to endocrine disorders: Secondary | ICD-10-CM

## 2021-04-28 DIAGNOSIS — R9431 Abnormal electrocardiogram [ECG] [EKG]: Secondary | ICD-10-CM

## 2021-04-28 LAB — POCT GLYCOSYLATED HEMOGLOBIN (HGB A1C): Hemoglobin A1C: 6.9 % — AB (ref 4.0–5.6)
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
## 2021-04-28 NOTE — Assessment & Plan Note (Signed)
-  Stable. -Continue current medication regimen. -Will collect CMP for medication monitoring. -Will continue to monitor. 

## 2021-04-28 NOTE — Assessment & Plan Note (Signed)
-  Evaluated by cardiology. -Reviewed consult, labs and imaging.

## 2021-04-28 NOTE — Assessment & Plan Note (Addendum)
-  PHQ-9 score of 0, GAD-7 score of 1, stable. -Continue current medication regimen. -Will continue to monitor.

## 2021-04-28 NOTE — Patient Instructions (Signed)

## 2021-04-28 NOTE — Assessment & Plan Note (Signed)
-  A1c has improved from 7.1 to 6.9, will continue current medication regimen. -Continue ambulatory glucose monitoring and follow a low carbohydrate/glucose diet. -Continue with physical activity. -Will continue to monitor.

## 2021-04-29 LAB — CBC WITH DIFFERENTIAL/PLATELET
Basophils Absolute: 0.1 10*3/uL (ref 0.0–0.2)
Basos: 1 %
EOS (ABSOLUTE): 0.1 10*3/uL (ref 0.0–0.4)
Eos: 1 %
Hematocrit: 47.2 % (ref 37.5–51.0)
Hemoglobin: 16.7 g/dL (ref 13.0–17.7)
Immature Grans (Abs): 0 10*3/uL (ref 0.0–0.1)
Immature Granulocytes: 1 %
Lymphocytes Absolute: 2 10*3/uL (ref 0.7–3.1)
Lymphs: 25 %
MCH: 32.3 pg (ref 26.6–33.0)
MCHC: 35.4 g/dL (ref 31.5–35.7)
MCV: 91 fL (ref 79–97)
Monocytes Absolute: 0.7 10*3/uL (ref 0.1–0.9)
Monocytes: 8 %
Neutrophils Absolute: 5.2 10*3/uL (ref 1.4–7.0)
Neutrophils: 64 %
Platelets: 249 10*3/uL (ref 150–450)
RBC: 5.17 x10E6/uL (ref 4.14–5.80)
RDW: 12.4 % (ref 11.6–15.4)
WBC: 8 10*3/uL (ref 3.4–10.8)

## 2021-04-29 LAB — COMPREHENSIVE METABOLIC PANEL
ALT: 32 IU/L (ref 0–44)
AST: 26 IU/L (ref 0–40)
Albumin/Globulin Ratio: 2 (ref 1.2–2.2)
Albumin: 4.7 g/dL (ref 3.8–4.8)
Alkaline Phosphatase: 65 IU/L (ref 44–121)
BUN/Creatinine Ratio: 12 (ref 10–24)
BUN: 10 mg/dL (ref 8–27)
Bilirubin Total: 0.5 mg/dL (ref 0.0–1.2)
CO2: 23 mmol/L (ref 20–29)
Calcium: 9.4 mg/dL (ref 8.6–10.2)
Chloride: 104 mmol/L (ref 96–106)
Creatinine, Ser: 0.85 mg/dL (ref 0.76–1.27)
Globulin, Total: 2.3 g/dL (ref 1.5–4.5)
Glucose: 159 mg/dL — ABNORMAL HIGH (ref 65–99)
Potassium: 4.4 mmol/L (ref 3.5–5.2)
Sodium: 143 mmol/L (ref 134–144)
Total Protein: 7 g/dL (ref 6.0–8.5)
eGFR: 99 mL/min/{1.73_m2} (ref >59)

## 2021-04-29 LAB — LIPID PANEL
Chol/HDL Ratio: 2.8 ratio (ref 0.0–5.0)
Cholesterol, Total: 126 mg/dL (ref 100–199)
HDL: 45 mg/dL (ref >39)
LDL Chol Calc (NIH): 62 mg/dL (ref 0–99)
Triglycerides: 103 mg/dL (ref 0–149)
VLDL Cholesterol Cal: 19 mg/dL (ref 5–40)

## 2021-05-31 ENCOUNTER — Encounter: Payer: Self-pay | Admitting: Physician Assistant

## 2021-05-31 ENCOUNTER — Ambulatory Visit (INDEPENDENT_AMBULATORY_CARE_PROVIDER_SITE_OTHER): Payer: PRIVATE HEALTH INSURANCE | Admitting: Physician Assistant

## 2021-05-31 VITALS — BP 119/70 | HR 72 | Temp 98.2°F | Ht 72.0 in | Wt 242.7 lb

## 2021-05-31 DIAGNOSIS — Z23 Encounter for immunization: Secondary | ICD-10-CM | POA: Diagnosis not present

## 2021-05-31 NOTE — Progress Notes (Signed)
Patient is here for a Flu shot pt denies any allergic reaction to the vaccination

## 2021-06-30 ENCOUNTER — Other Ambulatory Visit: Payer: Self-pay

## 2021-06-30 DIAGNOSIS — F430 Acute stress reaction: Secondary | ICD-10-CM

## 2021-06-30 MED ORDER — FLUOXETINE HCL 20 MG PO CAPS: 20.0000 mg | ORAL_CAPSULE | Freq: Every day | ORAL | 0 refills | Status: DC

## 2021-07-30 ENCOUNTER — Other Ambulatory Visit: Payer: Self-pay | Admitting: Physician Assistant

## 2021-07-30 DIAGNOSIS — I152 Hypertension secondary to endocrine disorders: Secondary | ICD-10-CM

## 2021-08-25 ENCOUNTER — Telehealth: Payer: Self-pay | Admitting: Physician Assistant

## 2021-08-25 ENCOUNTER — Other Ambulatory Visit: Payer: Self-pay

## 2021-08-25 DIAGNOSIS — E1169 Type 2 diabetes mellitus with other specified complication: Secondary | ICD-10-CM

## 2021-08-25 MED ORDER — METFORMIN HCL 500 MG PO TABS: 500.0000 mg | ORAL_TABLET | Freq: Two times a day (BID) | ORAL | 1 refills | Status: DC

## 2021-08-25 NOTE — Telephone Encounter (Signed)
Patient needs refill on his metformin sent to [facility].    metFORMIN (GLUCOPHAGE) 500 MG tablet [[id number]]    Order Details Dose: 500 mg Route: Oral Frequency: 2 times daily with meals  Dispense Quantity: 180 tablet Refills: 1        Sig: Take 1 tablet (500 mg total) by mouth 2 (two) times daily with a meal.       Start Date: 01/26/21 End Date: --  Written Date: 01/26/21 Expiration Date: 01/26/22     Diagnosis Association: Type 2 diabetes mellitus with other specified complication, without long-term current use of insulin (HCC) (E11.69)

## 2021-09-01 ENCOUNTER — Ambulatory Visit: Payer: PRIVATE HEALTH INSURANCE | Admitting: Physician Assistant

## 2021-09-08 ENCOUNTER — Encounter: Payer: Self-pay | Admitting: Physician Assistant

## 2021-09-08 ENCOUNTER — Other Ambulatory Visit: Payer: Self-pay

## 2021-09-08 ENCOUNTER — Ambulatory Visit (INDEPENDENT_AMBULATORY_CARE_PROVIDER_SITE_OTHER): Payer: PRIVATE HEALTH INSURANCE | Admitting: Physician Assistant

## 2021-09-08 VITALS — BP 138/80 | HR 70 | Temp 97.3°F | Ht 72.0 in | Wt 242.0 lb

## 2021-09-08 DIAGNOSIS — E781 Pure hyperglyceridemia: Secondary | ICD-10-CM | POA: Diagnosis not present

## 2021-09-08 DIAGNOSIS — E1159 Type 2 diabetes mellitus with other circulatory complications: Secondary | ICD-10-CM

## 2021-09-08 DIAGNOSIS — I152 Hypertension secondary to endocrine disorders: Secondary | ICD-10-CM

## 2021-09-08 DIAGNOSIS — F4323 Adjustment disorder with mixed anxiety and depressed mood: Secondary | ICD-10-CM | POA: Diagnosis not present

## 2021-09-08 DIAGNOSIS — E1169 Type 2 diabetes mellitus with other specified complication: Secondary | ICD-10-CM | POA: Diagnosis not present

## 2021-09-08 LAB — POCT GLYCOSYLATED HEMOGLOBIN (HGB A1C): Hemoglobin A1C: 6.8 % — AB (ref 4.0–5.6)
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
## 2021-09-08 NOTE — Assessment & Plan Note (Signed)
-  A1c has improved from 6.9 to 6.8, will continue current medication regimen. -Continue ambulatory glucose monitoring and notify clinic if FBS consistently <80 or >160. -Recommend a low glucose/carbohydrate diet and moderate aerobic exercise. -Advised patient to schedule annual diabetic eye exam. -Will continue to monitor.

## 2021-09-08 NOTE — Assessment & Plan Note (Signed)
-   BP initially elevated, BP reassessed and stable. Will continue current medication regimen. Patient plans to start weight loss program which will also provide benefits for hypertension. -Will continue to monitor.

## 2021-09-08 NOTE — Patient Instructions (Signed)

## 2021-09-08 NOTE — Assessment & Plan Note (Signed)
-  Stable. GAD-7 score of 1. PHQ-9 score of 1. -Continue current medication regimen. -Will continue to monitor.

## 2021-09-30 ENCOUNTER — Ambulatory Visit (INDEPENDENT_AMBULATORY_CARE_PROVIDER_SITE_OTHER)
Admission: RE | Admit: 2021-09-30 | Discharge: 2021-09-30 | Disposition: A | Discharge status: Home / Self Care | Payer: Commercial Managed Care - POS | Source: Ambulatory Visit | Attending: Internal Medicine | Admitting: Internal Medicine

## 2021-09-30 ENCOUNTER — Encounter: Payer: Self-pay | Admitting: Internal Medicine

## 2021-09-30 DIAGNOSIS — M79652 Pain in left thigh: Secondary | ICD-10-CM

## 2021-10-04 ENCOUNTER — Other Ambulatory Visit: Payer: Self-pay | Admitting: Physician Assistant

## 2021-10-04 DIAGNOSIS — F430 Acute stress reaction: Secondary | ICD-10-CM

## 2021-10-22 ENCOUNTER — Ambulatory Visit (INDEPENDENT_AMBULATORY_CARE_PROVIDER_SITE_OTHER): Payer: Commercial Managed Care - POS | Admitting: Surgery

## 2021-10-22 ENCOUNTER — Encounter (INDEPENDENT_AMBULATORY_CARE_PROVIDER_SITE_OTHER): Payer: Self-pay | Admitting: Surgery

## 2021-10-22 VITALS — BP 140/82 | HR 68 | Ht 69.0 in | Wt 286.8 lb

## 2021-10-22 DIAGNOSIS — I8002 Phlebitis and thrombophlebitis of superficial vessels of left lower extremity: Secondary | ICD-10-CM

## 2021-10-22 NOTE — Progress Notes (Signed)
[address]  [address], [address]  [place], [place] [address]  [phone]    Name: Peter Ramos, [id number]  DOB: 1961/05/31    Assessment:     1. 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  Agree with anticoagulation.  This was unprovoked but most likely secondary to underlying venous insufficiency given his history.    He has symptoms of varicose veins on the left side prior to this event.  Also noted to have bilateral extremity swelling, heaviness, and tiredness.  Worse at the end of the day and is beginning.    Recommend continue compressive therapy and anticoagulation.  He should follow-up in 5 months with duplex imaging to assess for underlying venous insufficiency.  There is nothing I can do in the acute phase in the setting of GSV thrombosis.  We need to see if we can uncover any underlying insufficiency.  I think it is more than reasonable to image his right leg as well given his symptoms.    I would recommend at least 6 months of anticoagulation.  It would be nice to reimage his lower extremities prior to cessation of anticoagulation treatment.    CEAP Classification  C3 - edema    Plan:     Continue anticoagulation.  Follow-up in 5 months with repeat duplex testing.  Continue risk factor modification to include walking program, weight loss, along with strict avoidance of all tobacco products as appropriate.    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 male who presents to the office as a new patient, referred for evaluation of history of a thrombosed left lower extremity great saphenous vein and accompanying varicose veins.    Patient experienced acute thrombosis approximately 1 month ago.   Underwent duplex imaging on 09/30/2021 that we discussed and reviewed during today's visit.  This demonstrates extensive thrombosis of his left great saphenous vein and varicose veins.  The thrombosis extends up to 3 cm of the saphenofemoral junction.  He has been placed on anticoagulation.  He is here today for assessment.  Patient reports longstanding history of symptomatic varicose veins in his left lower extremity as well as bilateral lower extremity edema, heaviness, and tiredness.  Reports at the end the day than is in the beginning.  He wears compressive therapy.    I reviewed all necessary printed and electronic medical records (including all essential imaging) during the patient's appointment today.    Past Medical History:     Past Medical History:   Diagnosis Date    Calculus of kidney     GERD (gastroesophageal reflux disease)     OSA (obstructive sleep apnea)     wears cpap at night       Past Surgical History:   History reviewed. No pertinent surgical history.    Family History:     Family History   Adopted: Yes   Problem Relation Age of Onset    Pulmonary embolism Other     Heart attack Father     Heart attack Sister        Social History:     Social History     Tobacco Use  Smoking Status Never   Smokeless Tobacco Never     Social History     Substance and Sexual Activity   Alcohol Use Yes    Comment: rarely     Social History     Substance and Sexual Activity   Drug Use No       Allergies:   No Known Allergies    Medications:     Current Outpatient Medications   Medication Sig Dispense Refill    apixaban (ELIQUIS) 5 MG Take 5 mg by mouth daily      pantoprazole (PROTONIX) 40 MG tablet Take 40 mg by mouth daily       No current facility-administered medications for this visit.       Review of Systems:     Constitutional: Negative for fever, chills   Respiratory: Negative for chest tightness, increased cough or shortness of breath  Cardiovascular: Negative for chest pain   Gastrointestinal: Negative  for abdominal pain.  Genitourinary: Negative for dysuria  Skin: Negative for ulcerations, negative for skin changes (ie. CVI).  Extremities: Negative for claudication, rest pain, gangrene, negative for history of DVT.  Negative for vein stripping/closure.  Positive for edema.  Positive for left great saphenous vein thrombosis.    Physical Exam:     Vitals:    10/22/21 0921   BP: 140/82   Pulse: 68   SpO2: 97%     Body mass index is 42.35 kg/m.       General: alert, conversant, well developed male, no acute distress  Cardiovascular: regular rate and rhythm  Lungs: normal respiratory effort, unlabored  Vascular: LUE: 2+ palpable radial, RUE: 2+ palpable radial, palpable DP pulses bilaterally.  Extremities: no clubbing or cyanosis    Painful firm thrombosed varicose veins on left medial thigh.  2+ edema bilateral lower extremities.              Imaging:     Duplex 09/30/2021    Findings:  The deep veins are widely patent with normal flow, compressibility, and augmentation. There is no evidence of deep venous thrombosis.     There is, however, a long segment of thrombus in the greater saphenous vein, extending from just above the knee to the upper greater saphenous vein. The clot extends to within 3 cm of the junction with the common femoral vein.     IMPRESSION:   No evidence of deep vein thrombosis in the left leg, but there is extensive thrombus in the greater saphenous vein, as above. Findings discussed with Dr. Rosilyn MingsHethcoat.           Signed by: Octaviano BattyMatthew J Kayce Chismar, MD    Primary Care Physician: Lequita HaltHethcoat, Gayland Oliver, MD  Referring Provider: @REFPROVFL @

## 2021-10-28 ENCOUNTER — Other Ambulatory Visit: Payer: Self-pay

## 2021-10-28 DIAGNOSIS — I152 Hypertension secondary to endocrine disorders: Secondary | ICD-10-CM

## 2021-10-28 MED ORDER — LOSARTAN POTASSIUM 100 MG PO TABS: 100.0000 mg | ORAL_TABLET | Freq: Every day | ORAL | 1 refills | Status: DC

## 2022-01-20 ENCOUNTER — Other Ambulatory Visit: Payer: PRIVATE HEALTH INSURANCE

## 2022-01-21 NOTE — Patient Instructions (Signed)

## 2022-01-28 ENCOUNTER — Ambulatory Visit (INDEPENDENT_AMBULATORY_CARE_PROVIDER_SITE_OTHER): Payer: PRIVATE HEALTH INSURANCE | Admitting: Physician Assistant

## 2022-01-28 ENCOUNTER — Encounter: Payer: Self-pay | Admitting: Physician Assistant

## 2022-01-28 VITALS — BP 138/73 | HR 81 | Temp 97.7°F | Ht 72.0 in | Wt 242.0 lb

## 2022-01-28 DIAGNOSIS — Z0000 Encounter for general adult medical examination without abnormal findings: Secondary | ICD-10-CM

## 2022-01-28 DIAGNOSIS — E781 Pure hyperglyceridemia: Secondary | ICD-10-CM | POA: Diagnosis not present

## 2022-01-28 DIAGNOSIS — E1159 Type 2 diabetes mellitus with other circulatory complications: Secondary | ICD-10-CM | POA: Diagnosis not present

## 2022-01-28 DIAGNOSIS — E1169 Type 2 diabetes mellitus with other specified complication: Secondary | ICD-10-CM

## 2022-01-28 DIAGNOSIS — Z1211 Encounter for screening for malignant neoplasm of colon: Secondary | ICD-10-CM

## 2022-01-28 DIAGNOSIS — I152 Hypertension secondary to endocrine disorders: Secondary | ICD-10-CM

## 2022-01-28 MED ORDER — METFORMIN HCL 500 MG PO TABS: 500.0000 mg | ORAL_TABLET | Freq: Two times a day (BID) | ORAL | 1 refills | Status: DC

## 2022-01-28 MED ORDER — LOSARTAN POTASSIUM 100 MG PO TABS: 100.0000 mg | ORAL_TABLET | Freq: Every day | ORAL | 1 refills | Status: DC
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
## 2022-01-29 LAB — COMPREHENSIVE METABOLIC PANEL
ALT: 34 IU/L (ref 0–44)
AST: 25 IU/L (ref 0–40)
Albumin/Globulin Ratio: 1.8 (ref 1.2–2.2)
Albumin: 4.6 g/dL (ref 3.8–4.8)
Alkaline Phosphatase: 66 IU/L (ref 44–121)
BUN/Creatinine Ratio: 16 (ref 10–24)
BUN: 15 mg/dL (ref 8–27)
Bilirubin Total: 0.6 mg/dL (ref 0.0–1.2)
CO2: 17 mmol/L — ABNORMAL LOW (ref 20–29)
Calcium: 9.6 mg/dL (ref 8.6–10.2)
Chloride: 104 mmol/L (ref 96–106)
Creatinine, Ser: 0.91 mg/dL (ref 0.76–1.27)
Globulin, Total: 2.5 g/dL (ref 1.5–4.5)
Glucose: 162 mg/dL — ABNORMAL HIGH (ref 70–99)
Potassium: 4.3 mmol/L (ref 3.5–5.2)
Sodium: 139 mmol/L (ref 134–144)
Total Protein: 7.1 g/dL (ref 6.0–8.5)
eGFR: 95 mL/min/{1.73_m2} (ref >59)

## 2022-01-29 LAB — CBC
Hematocrit: 44.6 % (ref 37.5–51.0)
Hemoglobin: 16.1 g/dL (ref 13.0–17.7)
MCH: 32.4 pg (ref 26.6–33.0)
MCHC: 36.1 g/dL — ABNORMAL HIGH (ref 31.5–35.7)
MCV: 90 fL (ref 79–97)
Platelets: 245 10*3/uL (ref 150–450)
RBC: 4.97 x10E6/uL (ref 4.14–5.80)
RDW: 12.8 % (ref 11.6–15.4)
WBC: 6.7 10*3/uL (ref 3.4–10.8)

## 2022-01-29 LAB — LIPID PANEL
Chol/HDL Ratio: 2.4 ratio (ref 0.0–5.0)
Cholesterol, Total: 120 mg/dL (ref 100–199)
HDL: 51 mg/dL (ref >39)
LDL Chol Calc (NIH): 43 mg/dL (ref 0–99)
Triglycerides: 156 mg/dL — ABNORMAL HIGH (ref 0–149)
VLDL Cholesterol Cal: 26 mg/dL (ref 5–40)

## 2022-01-29 LAB — HEMOGLOBIN A1C
Est. average glucose Bld gHb Est-mCnc: 166 mg/dL
Hgb A1c MFr Bld: 7.4 % — ABNORMAL HIGH (ref 4.8–5.6)

## 2022-01-29 LAB — TSH: TSH: 3 u[IU]/mL (ref 0.450–4.500)

## 2022-02-01 MED ORDER — METFORMIN HCL 500 MG PO TABS: ORAL_TABLET | ORAL | 1 refills | Status: DC

## 2022-02-01 NOTE — Addendum Note (Signed)
Addended by: Mickel Crow on: 02/01/2022 02:00 PM   Modules accepted: Orders

## 2022-02-18 ENCOUNTER — Other Ambulatory Visit: Payer: Self-pay | Admitting: Physician Assistant

## 2022-02-18 ENCOUNTER — Telehealth: Payer: Self-pay | Admitting: Physician Assistant

## 2022-02-18 DIAGNOSIS — E1169 Type 2 diabetes mellitus with other specified complication: Secondary | ICD-10-CM

## 2022-02-18 MED ORDER — METFORMIN HCL 500 MG PO TABS: ORAL_TABLET | ORAL | 1 refills | Status: DC

## 2022-02-18 NOTE — Telephone Encounter (Signed)
Patient requesting refill of Metformin, states he is going out of town next week and will be completely out. Please advise.

## 2022-02-18 NOTE — Telephone Encounter (Signed)
RX sent to pharmacy. AS, CMA 

## 2022-02-23 LAB — COLOGUARD: COLOGUARD: NEGATIVE

## 2022-03-22 ENCOUNTER — Ambulatory Visit (INDEPENDENT_AMBULATORY_CARE_PROVIDER_SITE_OTHER): Payer: Commercial Managed Care - POS | Admitting: Surgery

## 2022-03-22 ENCOUNTER — Ambulatory Visit (INDEPENDENT_AMBULATORY_CARE_PROVIDER_SITE_OTHER): Payer: Commercial Managed Care - POS

## 2022-03-22 ENCOUNTER — Other Ambulatory Visit (INDEPENDENT_AMBULATORY_CARE_PROVIDER_SITE_OTHER): Payer: Commercial Managed Care - POS

## 2022-03-22 ENCOUNTER — Encounter (INDEPENDENT_AMBULATORY_CARE_PROVIDER_SITE_OTHER): Payer: Self-pay | Admitting: Surgery

## 2022-03-22 VITALS — BP 118/70 | HR 82 | Wt 273.0 lb

## 2022-03-22 DIAGNOSIS — I8002 Phlebitis and thrombophlebitis of superficial vessels of left lower extremity: Secondary | ICD-10-CM

## 2022-03-22 DIAGNOSIS — I82812 Embolism and thrombosis of superficial veins of left lower extremities: Secondary | ICD-10-CM

## 2022-03-22 NOTE — Progress Notes (Signed)
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      We have been able to uncover significant reflux within his proximal left great saphenous vein.  Recanalization of the entire segment within the thigh except for the distal segment where it appears that there is chronic thrombosis present.  In order to prevent secondary recurrent thrombotic events recommend proceeding with closure of this incompetent segment as soon as possible.  We will get him scheduled.  I recommended he stay on anticoagulation until we have had a chance to close this vein.  There is no other evidence of reflux in either extremity.    The procedure, its rationale, risks (including but not limited to bleeding, bruising, vessel injury, varicose vein thrombosis, thermal skin injury, hyperpigmentation, skin necrosis, DVT, recurrence), benefits, and alternatives were discussed with the patient.     All questions were appropriately answered to the patient's satisfaction and written informed consent was obtained. The patient understood all the risks and complications related to the procedure and demonstrated willingness to proceed.    Plan:     Proceed with closure of his left great saphenous vein.  Continue risk factor modification to include walking program, weight loss, along with strict avoidance of all tobacco products as appropriate.    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 male who presents to the office for follow-up.  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    Patient experienced acute thrombosis  approximately 5 months ago .  Underwent duplex imaging on 09/30/2021 that demonstrated extensive thrombosis of his left great saphenous vein and varicose veins.  The thrombosis extends up to 3 cm of the saphenofemoral junction.  He was placed on anticoagulation for total of 6 months.  He is here today for follow-up and repeat duplex imaging to see if we can uncover any superficial venous reflux as an etiology behind his thrombosis.    I reviewed all necessary printed and electronic medical records (including all essential imaging) during the patient's appointment today.    Past Medical History:     Past Medical History:   Diagnosis Date    Calculus of kidney     GERD (gastroesophageal reflux disease)     OSA (obstructive sleep apnea)     wears cpap at night       Past Surgical History:   History reviewed. No pertinent surgical history.    Family History:     Family History   Adopted: Yes   Problem Relation Age of Onset    Pulmonary embolism Other     Heart attack Father     Heart attack Sister        Social History:     Social History     Tobacco Use   Smoking Status Never   Smokeless Tobacco Never     Social History     Substance and Sexual Activity   Alcohol Use Yes    Comment: rarely     Social History  Substance and Sexual Activity   Drug Use No       Allergies:   No Known Allergies    Medications:     Current Outpatient Medications   Medication Sig Dispense Refill    apixaban (ELIQUIS) 5 MG Take 1 tablet (5 mg) by mouth every 12 (twelve) hours      pantoprazole (PROTONIX) 40 MG tablet Take 1 tablet (40 mg) by mouth daily       No current facility-administered medications for this visit.       Review of Systems:     Constitutional: Negative for fever, chills   Respiratory: Negative for chest tightness, increased cough or shortness of breath  Cardiovascular: Negative for chest pain   Gastrointestinal: Negative for abdominal pain.  Genitourinary: Negative for dysuria  Skin: Negative for ulcerations, negative for  skin changes (ie. CVI).  Extremities: Negative for claudication, rest pain, gangrene, negative for history of DVT.  Negative for vein stripping/closure.  Positive for edema.  Positive for left great saphenous vein thrombosis.    Physical Exam:     Vitals:    03/22/22 1447   BP: 118/70   Pulse: 82   SpO2: 96%     Body mass index is 40.32 kg/m.       General: alert, conversant, well developed male, no acute distress  Cardiovascular: regular rate and rhythm  Lungs: normal respiratory effort, unlabored  Vascular: LUE: 2+ palpable radial, RUE: 2+ palpable radial, palpable DP pulses bilaterally.  Extremities: no clubbing or cyanosis    Imaging:     Duplex 09/30/2021    Findings:  The deep veins are widely patent with normal flow, compressibility, and augmentation. There is no evidence of deep venous thrombosis.     There is, however, a long segment of thrombus in the greater saphenous vein, extending from just above the knee to the upper greater saphenous vein. The clot extends to within 3 cm of the junction with the common femoral vein.     IMPRESSION:   No evidence of deep vein thrombosis in the left leg, but there is extensive thrombus in the greater saphenous vein, as above. Findings discussed with Dr. Rosilyn Mings.     Preliminary finding from duplex today dated 03/22/2022 demonstrates severe reflux of the entire proximal left great saphenous vein within the thigh.  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  Proximally the vein is widely patent.  No other evidence of superficial venous reflux or DVT.      Signed by: Octaviano Batty, MD    Primary Care Physician: Lequita Halt, MD  Referring Provider: @[email or web address] @

## 2022-03-30 ENCOUNTER — Telehealth (INDEPENDENT_AMBULATORY_CARE_PROVIDER_SITE_OTHER): Payer: Self-pay

## 2022-03-30 ENCOUNTER — Telehealth (INDEPENDENT_AMBULATORY_CARE_PROVIDER_SITE_OTHER): Payer: Self-pay | Admitting: Surgery

## 2022-03-30 DIAGNOSIS — I8002 Phlebitis and thrombophlebitis of superficial vessels of left lower extremity: Secondary | ICD-10-CM

## 2022-03-30 NOTE — Telephone Encounter (Signed)
-----   Message from Robyne Askew sent at 03/30/2022  2:01 PM EDT -----  Regarding: closure  LGSV 05/06/22  Dr Gala Lewandowsky      Has thigh high compression stockings    Needs f/u u/s

## 2022-03-30 NOTE — Telephone Encounter (Signed)
Patient is scheduled for a LGSV with Dr. Gala Lewandowsky on 05/06/22.    - Advised patient to contact their insurance company, as an authorization is not a guarantee of payment. They will be responsible for what their insurance does not cover.    - May have a light breakfast or lunch, prior to the procedure.    - Must drink 40 oz of water, 24 hrs prior to their procedure.     - Must bring their thigh high compression stockings, to their procedure.     - Patient confirmed they have thigh high compression stockings.    - Will have an U/S 2 days after their procedure.    - Will have a F/U with Dr. Gala Lewandowsky, 3 weeks after their procedure.     Patient verbalized understanding and was fine with everything.

## 2022-03-31 ENCOUNTER — Other Ambulatory Visit: Payer: Self-pay | Admitting: Physician Assistant

## 2022-03-31 DIAGNOSIS — F430 Acute stress reaction: Secondary | ICD-10-CM

## 2022-05-04 ENCOUNTER — Telehealth (INDEPENDENT_AMBULATORY_CARE_PROVIDER_SITE_OTHER): Payer: Self-pay | Admitting: Surgery

## 2022-05-04 NOTE — Telephone Encounter (Signed)
Called to remind patient to bring their thigh highcompression stockings to their RGSV Closure with Dr. Gala Lewandowsky on 05/06/22.    Also, reminded patient to contact their insurance company, as an authorization is not a guarantee of payment. They will be responsible for what their insurance does not cover.     Patient verbalized understanding and was fine with everything.

## 2022-05-06 ENCOUNTER — Ambulatory Visit (INDEPENDENT_AMBULATORY_CARE_PROVIDER_SITE_OTHER): Payer: BC Managed Care – PPO | Admitting: Surgery

## 2022-05-06 DIAGNOSIS — I87322 Chronic venous hypertension (idiopathic) with inflammation of left lower extremity: Secondary | ICD-10-CM

## 2022-05-06 NOTE — Addendum Note (Signed)
Addended by: Octaviano Batty on: 05/06/2022 11:08 AM     Modules accepted: Kipp Brood

## 2022-05-06 NOTE — Progress Notes (Signed)
Date of Procedure: 05/06/2022    Indication: Chronic venous hypertension with pain and inflammation, history of thrombosed left GSV with severe reflux    Procedure: VNUS closure of the left  great saphenous vein    Details of procedure: The left  leg was prepped and draped in a surgical fashion. The left  great saphenous vein mapped and noted to be patent.  There was however evidence of chronic thrombosis at the distal thigh segment.  I accessed the left  great saphenous vein in the thigh just proximal to the chronic thrombosis under ultrasound guidance with micropuncture technique and local anesthesia. The 7-French ClosureFAST catheter was advanced to the saphenofemoral junction and pulled back 2.5 cm inferior to the junction. Tumescent local anesthetic was injected along the course of the great saphenous vein in the thigh. The vein was the heated to 120 degrees Centigrade. The proximal segment was treated twice. The remaining segments were treated once down to the puncture site. The catheter was removed. Manual pressure was applied. Color flow Doppler was used to assess the deep system with ultrasound which demonstrated a widely patent common femoral vein as well as an occluded and thrombosed great saphenous vein.    Skin was approximated using Steri-Strips and 30-40 mmHg thigh-high compression stockings were applied. There were no adverse events.

## 2022-05-10 ENCOUNTER — Ambulatory Visit: Payer: BC Managed Care – PPO

## 2022-05-11 ENCOUNTER — Ambulatory Visit (INDEPENDENT_AMBULATORY_CARE_PROVIDER_SITE_OTHER): Payer: BC Managed Care – PPO

## 2022-05-11 DIAGNOSIS — I8002 Phlebitis and thrombophlebitis of superficial vessels of left lower extremity: Secondary | ICD-10-CM

## 2022-05-18 ENCOUNTER — Emergency Department (HOSPITAL_COMMUNITY): Payer: PRIVATE HEALTH INSURANCE

## 2022-05-18 ENCOUNTER — Emergency Department (HOSPITAL_COMMUNITY)
Admission: EM | Admit: 2022-05-18 | Discharge: 2022-05-18 | Disposition: A | Discharge status: Home / Self Care | Payer: PRIVATE HEALTH INSURANCE | Attending: Emergency Medicine | Admitting: Emergency Medicine

## 2022-05-18 DIAGNOSIS — R519 Headache, unspecified: Secondary | ICD-10-CM | POA: Diagnosis not present

## 2022-05-18 DIAGNOSIS — M6281 Muscle weakness (generalized): Secondary | ICD-10-CM | POA: Insufficient documentation

## 2022-05-18 DIAGNOSIS — I10 Essential (primary) hypertension: Secondary | ICD-10-CM | POA: Diagnosis not present

## 2022-05-18 DIAGNOSIS — E119 Type 2 diabetes mellitus without complications: Secondary | ICD-10-CM | POA: Insufficient documentation

## 2022-05-18 DIAGNOSIS — R42 Dizziness and giddiness: Secondary | ICD-10-CM | POA: Insufficient documentation

## 2022-05-18 DIAGNOSIS — R1110 Vomiting, unspecified: Secondary | ICD-10-CM | POA: Diagnosis not present

## 2022-05-18 LAB — I-STAT CHEM 8, ED
BUN: 22 mg/dL (ref 8–23)
Calcium, Ion: 1.22 mmol/L (ref 1.15–1.40)
Chloride: 106 mmol/L (ref 98–111)
Creatinine, Ser: 0.8 mg/dL (ref 0.61–1.24)
Glucose, Bld: 165 mg/dL — ABNORMAL HIGH (ref 70–99)
HCT: 43 % (ref 39.0–52.0)
Hemoglobin: 14.6 g/dL (ref 13.0–17.0)
Potassium: 3.6 mmol/L (ref 3.5–5.1)
Sodium: 140 mmol/L (ref 135–145)
TCO2: 23 mmol/L (ref 22–32)

## 2022-05-18 LAB — RAPID URINE DRUG SCREEN, HOSP PERFORMED
Amphetamines: NOT DETECTED
Barbiturates: NOT DETECTED
Benzodiazepines: NOT DETECTED
Cocaine: NOT DETECTED
Opiates: NOT DETECTED
Tetrahydrocannabinol: NOT DETECTED

## 2022-05-18 LAB — CBC
HCT: 42 % (ref 39.0–52.0)
Hemoglobin: 15.5 g/dL (ref 13.0–17.0)
MCH: 33.4 pg (ref 26.0–34.0)
MCHC: 36.9 g/dL — ABNORMAL HIGH (ref 30.0–36.0)
MCV: 90.5 fL (ref 80.0–100.0)
Platelets: 224 10*3/uL (ref 150–400)
RBC: 4.64 MIL/uL (ref 4.22–5.81)
RDW: 11.7 % (ref 11.5–15.5)
WBC: 9.3 10*3/uL (ref 4.0–10.5)
nRBC: 0 % (ref 0.0–0.2)

## 2022-05-18 LAB — DIFFERENTIAL
Abs Immature Granulocytes: 0.05 10*3/uL (ref 0.00–0.07)
Basophils Absolute: 0 10*3/uL (ref 0.0–0.1)
Basophils Relative: 0 %
Eosinophils Absolute: 0.1 10*3/uL (ref 0.0–0.5)
Eosinophils Relative: 1 %
Immature Granulocytes: 1 %
Lymphocytes Relative: 15 %
Lymphs Abs: 1.4 10*3/uL (ref 0.7–4.0)
Monocytes Absolute: 0.8 10*3/uL (ref 0.1–1.0)
Monocytes Relative: 9 %
Neutro Abs: 6.9 10*3/uL (ref 1.7–7.7)
Neutrophils Relative %: 74 %

## 2022-05-18 LAB — URINALYSIS, ROUTINE W REFLEX MICROSCOPIC
Bilirubin Urine: NEGATIVE
Glucose, UA: NEGATIVE mg/dL
Hgb urine dipstick: NEGATIVE
Ketones, ur: 5 mg/dL — AB
Leukocytes,Ua: NEGATIVE
Nitrite: NEGATIVE
Protein, ur: NEGATIVE mg/dL
Specific Gravity, Urine: 1.016 (ref 1.005–1.030)
pH: 7 (ref 5.0–8.0)

## 2022-05-18 LAB — COMPREHENSIVE METABOLIC PANEL
ALT: 46 U/L — ABNORMAL HIGH (ref 0–44)
AST: 34 U/L (ref 15–41)
Albumin: 3.4 g/dL — ABNORMAL LOW (ref 3.5–5.0)
Alkaline Phosphatase: 35 U/L — ABNORMAL LOW (ref 38–126)
Anion gap: 9 (ref 5–15)
BUN: 20 mg/dL (ref 8–23)
CO2: 21 mmol/L — ABNORMAL LOW (ref 22–32)
Calcium: 8.4 mg/dL — ABNORMAL LOW (ref 8.9–10.3)
Chloride: 109 mmol/L (ref 98–111)
Creatinine, Ser: 0.79 mg/dL (ref 0.61–1.24)
GFR, Estimated: >60 mL/min (ref >60)
Glucose, Bld: 157 mg/dL — ABNORMAL HIGH (ref 70–99)
Potassium: 3.4 mmol/L — ABNORMAL LOW (ref 3.5–5.1)
Sodium: 139 mmol/L (ref 135–145)
Total Bilirubin: 0.9 mg/dL (ref 0.3–1.2)
Total Protein: 5.8 g/dL — ABNORMAL LOW (ref 6.5–8.1)

## 2022-05-18 LAB — TROPONIN I (HIGH SENSITIVITY)
Troponin I (High Sensitivity): 5 ng/L (ref <18)
Troponin I (High Sensitivity): 5 ng/L (ref <18)

## 2022-05-18 LAB — APTT: aPTT: 25 seconds (ref 24–36)

## 2022-05-18 LAB — PROTIME-INR
INR: 1.1 (ref 0.8–1.2)
Prothrombin Time: 13.8 seconds (ref 11.4–15.2)

## 2022-05-18 MED ORDER — MECLIZINE HCL 25 MG PO TABS
25.0000 mg | ORAL_TABLET | Freq: Once | ORAL | Status: AC
  Administered 2022-05-18: 25 mg via ORAL
  Filled 2022-05-18: qty 1

## 2022-05-18 MED ORDER — ONDANSETRON HCL 4 MG/2ML IJ SOLN
4.0000 mg | Freq: Once | INTRAMUSCULAR | Status: AC
  Administered 2022-05-18: 4 mg via INTRAVENOUS
  Filled 2022-05-18: qty 2

## 2022-05-18 MED ORDER — ONDANSETRON HCL 4 MG PO TABS: 4.0000 mg | ORAL_TABLET | Freq: Three times a day (TID) | ORAL | 0 refills | Status: DC | PRN

## 2022-05-18 MED ORDER — IOHEXOL 350 MG/ML SOLN
75.0000 mL | Freq: Once | INTRAVENOUS | Status: AC | PRN
  Administered 2022-05-18: 75 mL via INTRAVENOUS

## 2022-05-18 MED ORDER — METOCLOPRAMIDE HCL 5 MG/ML IJ SOLN
10.0000 mg | Freq: Once | INTRAMUSCULAR | Status: AC
  Administered 2022-05-18: 10 mg via INTRAVENOUS
  Filled 2022-05-18: qty 2

## 2022-05-18 MED ORDER — MECLIZINE HCL 25 MG PO TABS: 25.0000 mg | ORAL_TABLET | Freq: Three times a day (TID) | ORAL | 0 refills | Status: AC | PRN

## 2022-05-18 NOTE — ED Triage Notes (Signed)
BIB [facility] from home call was for vertigo that has got worse over the last 8-10 hrs. Went unresponsive with EMS x 2, has been very lethargic, slow to answer questions, headache x 3-4 days. On arrival pt is lethargic able to answer questions appropriately but is slow to respond, repetitive questions and is copl and clammy

## 2022-05-18 NOTE — ED Notes (Signed)
Discharge instructions reviewed with patient. Patient denies any questions or concerns. Patient out to lobby via wheelchair. 

## 2022-05-18 NOTE — ED Notes (Signed)
Per wife LKW was last night around 2130. Pt woke up with severe vertigo. Pt has a hx of vertigo and TIA and she wasn't sure whether he was having a stroke or if it was his vertigo. ED provider made aware of same

## 2022-05-18 NOTE — ED Notes (Signed)
Pt transported to MRI at this time 

## 2022-05-18 NOTE — ED Notes (Signed)
Pt is c/o really bad dizziness and nausea. Spoke with provider and received verbal orders

## 2022-05-18 NOTE — ED Provider Notes (Signed)
[facility] EMERGENCY DEPARTMENT Provider Note   CSN: [id number] Arrival date & time: 05/18/22  0359     History  Chief Complaint  Patient presents with   Dizziness   Loss of Consciousness    Nathan Kelley is a 61 y.o. male.  The history is provided by the patient, the EMS personnel and medical records.  Nathan Kelley is a 61 y.o. male who presents to the Emergency Department complaining of dizziness.  He presents to the ED by EMS for evaluation of dizziness that started yesterday at an unknown time.  Per wife last known well at 9pm.  He had a 5-second episode of unresponsiveness with EMS. Pt reports vomiting that started just prior to ED arrival as well as three to four days of mild occipital headache.  No fever.  Has generalized weakness.   Has a hx/o HTN, DM, vertigo - todays sxs are different from prior vertigo sxs.       Home Medications Prior to Admission medications   Medication Sig Start Date End Date Taking? Authorizing Provider  meclizine (ANTIVERT) 25 MG tablet Take 1 tablet (25 mg total) by mouth 3 (three) times daily as needed for dizziness. 05/18/22  Yes Tilden Fossa, MD  ondansetron (ZOFRAN) 4 MG tablet Take 1 tablet (4 mg total) by mouth every 8 (eight) hours as needed for nausea or vomiting. 05/18/22  Yes Tilden Fossa, MD  acetaminophen (TYLENOL) 500 MG tablet Take 1,000 mg by mouth every 6 (six) hours as needed for mild pain or headache.    [provider]  AMBULATORY NON FORMULARY MEDICATION Single glucometer with lancets, test strips 11/06/17   Opalski, Deborah, DO  aspirin 325 MG tablet Take 325 mg by mouth daily.    [provider]  FLUoxetine (PROZAC) 20 MG capsule TAKE 1 CAPSULE (20 MG TOTAL) BY MOUTH DAILY. 03/31/22   Mayer Masker, PA-C  loperamide (IMODIUM) 2 MG capsule Take 4 mg by mouth as needed for diarrhea or loose stools.    [provider]  losartan (COZAAR) 100 MG tablet Take 1 tablet (100 mg  total) by mouth daily. 01/28/22   Mayer Masker, PA-C  metFORMIN (GLUCOPHAGE) 500 MG tablet Take 2 tabs by mouth in the morning and 1 tablet in the evening. 02/18/22   Mayer Masker, PA-C  metoprolol tartrate (LOPRESSOR) 100 MG tablet Take 1 tablet (100 mg total) by mouth once for 1 dose. HOURS BEFORE CARDIAC CTA 02/16/21 02/16/21  Rollene Rotunda, MD  naproxen sodium (ALEVE) 220 MG tablet Take 220 mg by mouth as needed (pain).    [provider]  OVER THE COUNTER MEDICATION Apply 1 application topically daily as needed (pain). CBD cream    [provider]      Allergies    Patient has no known allergies.    Review of Systems   Review of Systems  All other systems reviewed and are negative.   Physical Exam Updated Vital Signs BP 124/65   Pulse 69   Temp 97.9 F (36.6 C) (Oral)   Resp 16   SpO2 96%  Physical Exam Vitals and nursing note reviewed.  Constitutional:      Appearance: He is well-developed. He is ill-appearing and diaphoretic.  HENT:     Head: Normocephalic and atraumatic.     Comments: perrl Cardiovascular:     Rate and Rhythm: Normal rate and regular rhythm.     Heart sounds: No murmur heard. Pulmonary:  Effort: Pulmonary effort is normal. No respiratory distress.     Breath sounds: Normal breath sounds.  Abdominal:     Palpations: Abdomen is soft.     Tenderness: There is no abdominal tenderness. There is no guarding or rebound.  Musculoskeletal:        General: No swelling or tenderness.  Skin:    General: Skin is warm.  Neurological:     Mental Status: He is alert.     Comments: No asymmetry of facial movement.  Nystagmus on gaze to left.  Visual fields grossly intact.  Generalized mild weakness.  Sensation to light touch intact in all four extremities.  Oriented to day. Disoriented to place.  Slow to answer questions.    Psychiatric:        Behavior: Behavior normal.     ED Results / Procedures / Treatments   Labs (all labs  ordered are listed, but only abnormal results are displayed) Labs Reviewed  CBC - Abnormal; Notable for the following components:      Result Value   MCHC 36.9 (*)    All other components within normal limits  COMPREHENSIVE METABOLIC PANEL - Abnormal; Notable for the following components:   Potassium 3.4 (*)    CO2 21 (*)    Glucose, Bld 157 (*)    Calcium 8.4 (*)    Total Protein 5.8 (*)    Albumin 3.4 (*)    ALT 46 (*)    Alkaline Phosphatase 35 (*)    All other components within normal limits  URINALYSIS, ROUTINE W REFLEX MICROSCOPIC - Abnormal; Notable for the following components:   Ketones, ur 5 (*)    All other components within normal limits  I-STAT CHEM 8, ED - Abnormal; Notable for the following components:   Glucose, Bld 165 (*)    All other components within normal limits  PROTIME-INR  APTT  DIFFERENTIAL  RAPID URINE DRUG SCREEN, HOSP PERFORMED  ETHANOL  TROPONIN I (HIGH SENSITIVITY)  TROPONIN I (HIGH SENSITIVITY)    EKG EKG Interpretation  Date/Time:  Wednesday May 18 2022 06:04:19 EDT Ventricular Rate:  68 PR Interval:  191 QRS Duration: 166 QT Interval:  446 QTC Calculation: 475 R Axis:   15 Text Interpretation: Sinus rhythm Left bundle branch block Improved from prior No Sgarbossa criteria Confirmed by Vivien Rossetti ([id number]) on 05/18/2022 7:29:20 AM  Radiology MR Brain Wo Contrast (neuro protocol)  Result Date: 05/18/2022 CLINICAL DATA:  61 year old male woke with severe vertigo at 2130 hours. TIA. EXAM: MRI HEAD WITHOUT CONTRAST TECHNIQUE: Multiplanar, multiecho pulse sequences of the brain and surrounding structures were obtained without intravenous contrast. COMPARISON:  CTA head and neck 0453 hours today. FINDINGS: Brain: No restricted diffusion to suggest acute infarction. No midline shift, mass effect, evidence of mass lesion, ventriculomegaly, extra-axial collection or acute intracranial hemorrhage. Cervicomedullary junction and pituitary  are within normal limits. Probable left cerebellar developmental venous anomaly on series 15, image 7 (normal variant). Largely normal for age gray and white matter signal throughout the brain. Minimal to mild scattered and patchy nonspecific cerebral white matter T2 and FLAIR hyperintensity. No cortical encephalomalacia or chronic cerebral blood products identified. Deep gray nuclei, brainstem, and cerebellum are within normal limits. Vascular: Major intracranial vascular flow voids are preserved. Right anterior clinoid process pneumatization (normal variant). Skull and upper cervical spine: Negative. Sinuses/Orbits: Negative. Other: Mastoids are clear. Visible internal auditory structures appear normal. Normal stylomastoid foramina. Negative visible scalp and face. IMPRESSION: No acute intracranial abnormality, and  essentially normal for age noncontrast MRI appearance of the brain. Electronically Signed   By: Odessa Fleming M.D.   On: 05/18/2022 07:00   CT ANGIO HEAD NECK W WO CM  Result Date: 05/18/2022 CLINICAL DATA:  61 year old male with sudden severe headache. Vertigo, lethargy. EXAM: CT ANGIOGRAPHY HEAD AND NECK TECHNIQUE: Multidetector CT imaging of the head and neck was performed using the standard protocol during bolus administration of intravenous contrast. Multiplanar CT image reconstructions and MIPs were obtained to evaluate the vascular anatomy. Carotid stenosis measurements (when applicable) are obtained utilizing NASCET criteria, using the distal internal carotid diameter as the denominator. RADIATION DOSE REDUCTION: This exam was performed according to the departmental dose-optimization program which includes automated exposure control, adjustment of the mA and/or kV according to patient size and/or use of iterative reconstruction technique. CONTRAST:  2mL OMNIPAQUE IOHEXOL 350 MG/ML SOLN COMPARISON:  Brain MRI 11/16/2007.  Head CT 11/16/2007. FINDINGS: CT HEAD Brain: Cerebral volume remains normal  for age. No midline shift, ventriculomegaly, mass effect, evidence of mass lesion, intracranial hemorrhage or evidence of cortically based acute infarction. Gray-white matter differentiation is within normal limits throughout the brain. Calvarium and skull base: No acute osseous abnormality identified. Paranasal sinuses: Visualized paranasal sinuses and mastoids are clear. Tympanic cavities appear clear. Orbits: Postoperative changes to both globes since 2009. Otherwise negative orbits and scalp CTA NECK Skeleton: No acute osseous abnormality identified. Mild for age cervical spine degeneration. Upper chest: Negative. Other neck: Motion artifact at the glottis and hypopharynx. Atrophied or absent left submandibular gland. No neck mass, lymphadenopathy, or acute finding identified. Aortic arch: 3 vessel arch configuration, no arch atherosclerosis. Right carotid system: Mildly tortuous brachiocephalic artery and right CCA origin. Mild motion artifact at the right carotid bifurcation. But no plaque or stenosis identified to the skull base. Left carotid system: Mild motion artifact. No convincing plaque or stenosis. Vertebral arteries: Proximal subclavian arteries and vertebral artery origins are normal. Right vertebral artery is dominant. Both vertebrals remain patent to the skull base with no plaque or stenosis identified. CTA HEAD Posterior circulation: Non dominant left vertebral artery is diminutive beyond the patent left PICA origin. There is a small V4 segment which continues to the vertebrobasilar junction. Distal right V4 segment is dominant and patent with mild irregularity but no significant stenosis. Patent vertebrobasilar junction. Patent although irregular basilar artery (series 14, image 23) but no hemodynamically significant basilar artery stenosis. Fetal type left PCA origin. Diminutive or absent right posterior communicating artery. Patent SCA origins. Bilateral PCA branches are patent but irregular.  Mild to moderate irregularity on the left. And there is moderate to severe irregularity and stenosis of the right PCA inferior P3 division (series 16, image 20). Anterior circulation: Both ICA siphons are patent. Mild left siphon plaque and no stenosis. Normal left posterior communicating artery origin. Mild right siphon calcified plaque without stenosis. Patent carotid termini, MCA and ACA origins. Dominant right ACA A1 segment. Normal anterior communicating artery. Bilateral ACA branches are mildly irregular throughout. There is up to moderate A3 segment stenosis bilaterally. Left MCA M1 segment is patent with mild irregularity. Patent left MCA bifurcation without stenosis. Right MCA M1 segment is mildly tortuous and irregular. Patent right MCA trifurcation without stenosis. Mild left and moderate right MCA M2 and M3 branch irregularity and stenoses. And up to severe right M4 branch stenoses (series 16, image 15). Venous sinuses: Early contrast timing, grossly patent. Anatomic variants: Diminutive vertebrobasilar system on the basis of fetal type left PCA origin  and dominant right vertebral artery. Dominant right ACA A1. Review of the MIP images confirms the above findings IMPRESSION: 1. Negative for large vessel occlusion, and no significant arterial plaque or stenosis in the neck. 2. But Positive for evidence of intracranial atherosclerosis, especially affecting the bilateral 3rd order and distal circle-of-Willis branches. No associated large vessel stenosis. But moderate to severe stenoses of the bilateral A3, Right M3 and P3 branches. 3. Despite #2, normal for age CT appearance of the brain. No acute intracranial abnormality. Electronically Signed   By: Odessa Fleming M.D.   On: 05/18/2022 05:17    Procedures Procedures    Medications Ordered in ED Medications  iohexol (OMNIPAQUE) 350 MG/ML injection 75 mL (75 mLs Intravenous Contrast Given 05/18/22 0458)  ondansetron (ZOFRAN) injection 4 mg (4 mg Intravenous  Given 05/18/22 0520)  metoCLOPramide (REGLAN) injection 10 mg (10 mg Intravenous Given 05/18/22 0556)  meclizine (ANTIVERT) tablet 25 mg (25 mg Oral Given 05/18/22 8592)    ED Course/ Medical Decision Making/ A&P                           Medical Decision Making Amount and/or Complexity of Data Reviewed Labs: ordered. Radiology: ordered.  Risk Prescription drug management.   Pt with hx/o HTN, DM here for evaluation of vertigo.  EMS reports brief episode of unresponsiveness without seizure activity.  No reports of arrhythmia prior to ED arrival.  Pt with nystagmus on horizontal gaze to the left, otherwise no acute neurologic deficits.  CTA with intracranial atherosclerosis otherwise no acute abnormality.  EKG with LBBB - new compared to prior but pt without active CP.  Presentation is not c/w ACS, arrhythmia.  Troponin wnl.  Pt feeling partially improved after treatment in the ED.   MRI obtained, negative for acute CVA.  After treatment in the emergency department patient's symptoms are significantly improved and he is able to ambulate.  Discussed with patient home care for vertigo.  Discussed incidental findings of left bundle branch block as well as intracranial atherosclerosis.  At this point feel he is stable for discharge home with outpatient follow-up.  Clinical picture is not consistent with arrhythmia, subarachnoid hemorrhage, hypertensive urgency.       Final Clinical Impression(s) / ED Diagnoses Final diagnoses:  Vertigo  Bad headache    Rx / DC Orders ED Discharge Orders          Ordered    ondansetron (ZOFRAN) 4 MG tablet  Every 8 hours PRN        05/18/22 0729    meclizine (ANTIVERT) 25 MG tablet  3 times daily PRN        05/18/22 0729              Tilden Fossa, MD 05/18/22 [phone]

## 2022-05-18 NOTE — Discharge Instructions (Signed)
These follow-up with your family doctor.  Your EKG showed a left bundle branch block, which is new for you.  Please follow-up with your family doctor for recheck.

## 2022-05-24 ENCOUNTER — Ambulatory Visit (INDEPENDENT_AMBULATORY_CARE_PROVIDER_SITE_OTHER): Payer: BC Managed Care – PPO | Admitting: Surgery

## 2022-05-24 ENCOUNTER — Encounter (INDEPENDENT_AMBULATORY_CARE_PROVIDER_SITE_OTHER): Payer: Self-pay | Admitting: Surgery

## 2022-05-24 VITALS — BP 130/72 | HR 72 | Wt 256.5 lb

## 2022-05-24 DIAGNOSIS — I87322 Chronic venous hypertension (idiopathic) with inflammation of left lower extremity: Secondary | ICD-10-CM

## 2022-05-24 NOTE — Progress Notes (Signed)
[address]  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      He has subsequently undergone closure of his left great saphenous vein.  Repeat duplex imaging after closure demonstrates successful procedure with no evidence of DVT.  At this time point I think it is safe for him to stop his anticoagulation.  He may follow-up with me as needed in the future.  I am hopeful that by ablating this vein we have solve the underlying reflux that is the most likely etiology behind his thrombosis in the first place.    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 male who presents to the office for follow-up.      He is now status post closure of his left great saphenous vein.  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  Symptomatically he is doing well.  No complaints since closure.    Past Medical History:     Past Medical History:   Diagnosis Date    Calculus of kidney     GERD (gastroesophageal reflux disease)     OSA (obstructive sleep apnea)     wears cpap at night       Past Surgical History:   History reviewed. No pertinent surgical history.    Family History:     Family History   Adopted: Yes   Problem Relation Age of Onset    Pulmonary embolism Other     Heart attack Father     Heart attack Sister        Social History:     Social History     Tobacco Use   Smoking Status Never   Smokeless Tobacco Never     Social History     Substance and Sexual Activity   Alcohol Use Yes    Comment: rarely     Social History     Substance and Sexual Activity   Drug Use No       Allergies:   No Known Allergies    Medications:     Current Outpatient  Medications   Medication Sig Dispense Refill    apixaban (ELIQUIS) 5 MG Take 1 tablet (5 mg) by mouth every 12 (twelve) hours      pantoprazole (PROTONIX) 40 MG tablet Take 1 tablet (40 mg) by mouth daily       No current facility-administered medications for this visit.       Review of Systems:     Constitutional: Negative for fever, chills   Respiratory: Negative for chest tightness, increased cough or shortness of breath  Cardiovascular: Negative for chest pain   Gastrointestinal: Negative for abdominal pain.  Genitourinary: Negative for dysuria  Skin: Negative for ulcerations, negative for skin changes (ie. CVI).  Extremities: Negative for claudication, rest pain, gangrene, negative for history of DVT.  Negative for vein stripping/closure.  Positive for edema.  Positive for left great saphenous vein thrombosis.    Physical Exam:     Vitals:    05/24/22 0853   BP: 130/72   Pulse:  72   SpO2: 98%     Body mass index is 37.88 kg/m.       General: alert, conversant, well developed male, no acute distress  Cardiovascular: regular rate and rhythm  Lungs: normal respiratory effort, unlabored  Vascular: LUE: 2+ palpable radial, RUE: 2+ palpable radial, palpable DP pulses bilaterally.  Extremities: no clubbing or cyanosis    Access site clean, dry, and intact.  No evidence of hematoma or infection.    Imaging:     Duplex 09/30/2021    Findings:  The deep veins are widely patent with normal flow, compressibility, and augmentation. There is no evidence of deep venous thrombosis.     There is, however, a long segment of thrombus in the greater saphenous vein, extending from just above the knee to the upper greater saphenous vein. The clot extends to within 3 cm of the junction with the common femoral vein.     IMPRESSION:   No evidence of deep vein thrombosis in the left leg, but there is extensive thrombus in the greater saphenous vein, as above. Findings discussed with Dr. Rosilyn MingsHethcoat.     Preliminary finding from duplex  today dated 03/22/2022 demonstrates severe reflux of the entire proximal left great saphenous vein within the thigh.  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  Proximally the vein is widely patent.  No other evidence of superficial venous reflux or DVT.    Duplex 05/11/2022 demonstrates successful closure of the left great saphenous vein without evidence of DVT.      Signed by: Octaviano BattyMatthew J Robbert Langlinais, MD    Primary Care Physician: Lequita HaltHethcoat, Gayland Oliver, MD  Referring Provider: @REFPROVFL @

## 2022-05-26 ENCOUNTER — Ambulatory Visit: Payer: BC Managed Care – PPO

## 2022-05-31 ENCOUNTER — Encounter: Payer: Self-pay | Admitting: Physician Assistant

## 2022-05-31 ENCOUNTER — Ambulatory Visit (INDEPENDENT_AMBULATORY_CARE_PROVIDER_SITE_OTHER): Payer: PRIVATE HEALTH INSURANCE | Admitting: Physician Assistant

## 2022-05-31 VITALS — BP 116/73 | HR 73 | Ht 72.0 in | Wt 236.1 lb

## 2022-05-31 DIAGNOSIS — I152 Hypertension secondary to endocrine disorders: Secondary | ICD-10-CM | POA: Diagnosis not present

## 2022-05-31 DIAGNOSIS — E1159 Type 2 diabetes mellitus with other circulatory complications: Secondary | ICD-10-CM | POA: Diagnosis not present

## 2022-05-31 DIAGNOSIS — E669 Obesity, unspecified: Secondary | ICD-10-CM

## 2022-05-31 DIAGNOSIS — Z6832 Body mass index (BMI) 32.0-32.9, adult: Secondary | ICD-10-CM

## 2022-05-31 DIAGNOSIS — E1169 Type 2 diabetes mellitus with other specified complication: Secondary | ICD-10-CM

## 2022-05-31 DIAGNOSIS — E781 Pure hyperglyceridemia: Secondary | ICD-10-CM | POA: Diagnosis not present

## 2022-05-31 DIAGNOSIS — R519 Headache, unspecified: Secondary | ICD-10-CM

## 2022-05-31 DIAGNOSIS — I2510 Atherosclerotic heart disease of native coronary artery without angina pectoris: Secondary | ICD-10-CM | POA: Diagnosis not present

## 2022-05-31 DIAGNOSIS — E66811 Obesity, class 1: Secondary | ICD-10-CM

## 2022-05-31 LAB — POCT GLYCOSYLATED HEMOGLOBIN (HGB A1C): Hemoglobin A1C: 6.5 % — AB (ref 4.0–5.6)

## 2022-05-31 MED ORDER — ROSUVASTATIN CALCIUM 20 MG PO TABS: 20.0000 mg | ORAL_TABLET | Freq: Every day | ORAL | 1 refills | Status: DC

## 2022-05-31 NOTE — Assessment & Plan Note (Signed)
-  Associated with hypertension, diabetes mellitus, and hypertriglyceridemia.  -Patient has lost 6 pounds since last visit, encourage to continue weight loss efforts with diet and lifestyle changes.

## 2022-05-31 NOTE — Assessment & Plan Note (Signed)
>>  ASSESSMENT AND PLAN FOR OBESITY WRITTEN ON 05/31/2022  5:15 PM BY ABONZA, MARITZA, PA-C  -Associated with hypertension, diabetes mellitus, and hypertriglyceridemia.  -Patient has lost 6 pounds since last visit, encourage to continue weight loss efforts with diet and lifestyle changes.

## 2022-05-31 NOTE — Assessment & Plan Note (Signed)
-  A1c has improved from 7.4 to 6.5 and at goal <7.0. Recommend to continue Metformin 1000 mg in am and 500 mg in pm. Continue with low carbohydrate and glucose diet. Continue ambulatory glucose monitoring.

## 2022-05-31 NOTE — Assessment & Plan Note (Signed)
-  Controlled. Continue current medication regimen. Encourage to continue weight loss efforts with diet changes and continue good hydration.

## 2022-05-31 NOTE — Progress Notes (Signed)
Established patient visit   Patient: Nathan Kelley   DOB: August 01, 1961   61 y.o. Male  MRN: [id number] Visit Date: 05/31/2022  Chief Complaint  Patient presents with   Follow-up   Diabetes   Subjective    HPI  Patient presents for chronic follow-up visit.  Patient recently went to the ED via EMS for dizziness and nausea which was attributed to severe vertigo. Pt does have a hx of prior TIA. Also reports having a mild headache since then as well which was told was likely a migraine.  Diabetes: Pt denies increased urination or thirst. Pt reports medication compliance. No hypoglycemic events. Checking glucose at home. FBS this morning was 118. States has worked on reducing carbohydrates and sugar. Stopped drinking alcohol for several weeks now.   HTN: Pt denies chest pain, palpitations, shortness of breath or lower extremity swelling. Taking medication as directed without side effects. Drinking at least 64 fl oz.  HLD: Pt managing with diet.   -stopped alcohol, changed diet Migraine- headache, samples of nurtec   Medications: Outpatient Medications Prior to Visit  Medication Sig   acetaminophen (TYLENOL) 500 MG tablet Take 1,000 mg by mouth every 6 (six) hours as needed for mild pain or headache.   AMBULATORY NON FORMULARY MEDICATION Single glucometer with lancets, test strips   aspirin 325 MG tablet Take 325 mg by mouth daily.   FLUoxetine (PROZAC) 20 MG capsule TAKE 1 CAPSULE (20 MG TOTAL) BY MOUTH DAILY.   loperamide (IMODIUM) 2 MG capsule Take 4 mg by mouth as needed for diarrhea or loose stools.   losartan (COZAAR) 100 MG tablet Take 1 tablet (100 mg total) by mouth daily.   meclizine (ANTIVERT) 25 MG tablet Take 1 tablet (25 mg total) by mouth 3 (three) times daily as needed for dizziness.   metFORMIN (GLUCOPHAGE) 500 MG tablet Take 2 tabs by mouth in the morning and 1 tablet in the evening.   naproxen sodium (ALEVE) 220 MG tablet Take 220 mg by mouth as needed (pain).    ondansetron (ZOFRAN) 4 MG tablet Take 1 tablet (4 mg total) by mouth every 8 (eight) hours as needed for nausea or vomiting.   OVER THE COUNTER MEDICATION Apply 1 application topically daily as needed (pain). CBD cream   metoprolol tartrate (LOPRESSOR) 100 MG tablet Take 1 tablet (100 mg total) by mouth once for 1 dose. HOURS BEFORE CARDIAC CTA   No facility-administered medications prior to visit.    Review of Systems Review of Systems:  A fourteen system review of systems was performed and found to be positive as per HPI.  Last CBC Lab Results  Component Value Date   WBC 9.3 05/18/2022   HGB 14.6 05/18/2022   HCT 43.0 05/18/2022   MCV 90.5 05/18/2022   MCH 33.4 05/18/2022   RDW 11.7 05/18/2022   PLT 224 37/85/8850   Last metabolic panel Lab Results  Component Value Date   GLUCOSE 165 (H) 05/18/2022   NA 140 05/18/2022   K 3.6 05/18/2022   CL 106 05/18/2022   CO2 21 (L) 05/18/2022   BUN 22 05/18/2022   CREATININE 0.80 05/18/2022   GFRNONAA >60 05/18/2022   CALCIUM 8.4 (L) 05/18/2022   PROT 5.8 (L) 05/18/2022   ALBUMIN 3.4 (L) 05/18/2022   LABGLOB 2.5 01/28/2022   AGRATIO 1.8 01/28/2022   BILITOT 0.9 05/18/2022   ALKPHOS 35 (L) 05/18/2022   AST 34 05/18/2022   ALT 46 (H) 05/18/2022   ANIONGAP 9 05/18/2022  Last lipids Lab Results  Component Value Date   CHOL 120 01/28/2022   HDL 51 01/28/2022   LDLCALC 43 01/28/2022   TRIG 156 (H) 01/28/2022   CHOLHDL 2.4 01/28/2022   Last hemoglobin A1c Lab Results  Component Value Date   HGBA1C 6.5 (A) 05/31/2022   Last thyroid functions Lab Results  Component Value Date   TSH 3.000 01/28/2022   Last vitamin D Lab Results  Component Value Date   VD25OH 44.3 01/02/2020       Objective    BP 116/73   Pulse 73   Ht 6' (1.829 m)   Wt 236 lb 2.1 oz (107.1 kg)   SpO2 98%   BMI 32.02 kg/m  BP Readings from Last 3 Encounters:  05/31/22 116/73  05/18/22 124/65  01/28/22 138/73   Wt Readings from Last 3  Encounters:  05/31/22 236 lb 2.1 oz (107.1 kg)  01/28/22 242 lb (109.8 kg)  09/08/21 242 lb (109.8 kg)    Physical Exam  General:  Pleasant and cooperative, appropriate for stated age.  Neuro:  Alert and oriented,  extra-ocular muscles intact  HEENT:  Normocephalic, atraumatic, neck supple  Skin:  no gross rash, warm, pink. Cardiac:  RRR, S1 S2 Respiratory: CTA B/L  Vascular:  Ext warm, no cyanosis apprec.; cap RF less 2 sec. Psych:  No HI/SI, judgement and insight good, Euthymic mood. Full Affect.   Results for orders placed or performed in visit on 05/31/22  POCT glycosylated hemoglobin (Hb A1C)  Result Value Ref Range   Hemoglobin A1C 6.5 (A) 4.0 - 5.6 %   HbA1c POC (<> result, manual entry)     HbA1c, POC (prediabetic range)     HbA1c, POC (controlled diabetic range)      Assessment & Plan     \ Problem List Items Addressed This Visit       Cardiovascular and Mediastinum   Hypertension associated with diabetes (HCC) (Chronic)    -Controlled. Continue current medication regimen. Encourage to continue weight loss efforts with diet changes and continue good hydration.      Relevant Medications   rosuvastatin (CRESTOR) 20 MG tablet   Other Relevant Orders   POCT glycosylated hemoglobin (Hb A1C) (Completed)     Endocrine   Diabetes mellitus (HCC)- diet controlled - Primary (Chronic)    -A1c has improved from 7.4 to 6.5 and at goal <7.0. Recommend to continue Metformin 1000 mg in am and 500 mg in pm. Continue with low carbohydrate and glucose diet. Continue ambulatory glucose monitoring.      Relevant Medications   rosuvastatin (CRESTOR) 20 MG tablet   Other Relevant Orders   POCT glycosylated hemoglobin (Hb A1C) (Completed)     Other   Obesity    -Associated with hypertension, diabetes mellitus, and hypertriglyceridemia.  -Patient has lost 6 pounds since last visit, encourage to continue weight loss efforts with diet and lifestyle changes.       Other Visit  Diagnoses     Hypertriglyceridemia       Relevant Medications   rosuvastatin (CRESTOR) 20 MG tablet   Atherosclerotic cardiovascular disease       Relevant Medications   rosuvastatin (CRESTOR) 20 MG tablet   Acute intractable headache, unspecified headache type          Atherosclerotic cardiovascular disease: -Reviewed ED notes, labs and imaging. -Pt with prior hx of TIA so discussed secondary prevention for cardiovascular events including starting a statin therapy. Discussed risks vs benefits including  potential side effects and patient is agreeable to trial rosuvastatin 20 mg daily. Advised to let me know if unable to tolerate medication and we would discontinue. Recommend repeating lipid panel and hepatic function at f/up visit. Also discussed repeating imaging at 6 month interval to re-evaluate intracranial atherosclerosis for changes/improvement. Recommend to continue aspirin 325 mg daily.  -CT angio head neck w wo 05/18/2022 IMPRESSION: 1. Negative for large vessel occlusion, and no significant arterial plaque or stenosis in the neck.   2. But Positive for evidence of intracranial atherosclerosis, especially affecting the bilateral 3rd order and distal circle-of-Willis branches. No associated large vessel stenosis. But moderate to severe stenoses of the bilateral A3, Right M3 and P3 branches.  Acute intractable headache: -Provided sample of Nurtec 75 mg to take for acute headache. Advised if headache resolves then consistent with migraine.    Return in about 4 months (around 09/30/2022) for DM, HTN, HLD and FBW (lipid panel, cmp, a1c).        Mayer Masker, PA-C  [facility] at [facility] [phone] (phone) [phone] (fax)  [facility]

## 2022-05-31 NOTE — Patient Instructions (Signed)

## 2022-06-15 ENCOUNTER — Telehealth: Payer: Self-pay

## 2022-06-15 NOTE — Telephone Encounter (Signed)
Patient called office to inform that the medication rosuvastatin and wanted to see if he could try another medication due to it making him very goofy, also patient went to his pharmacy and spoke with pharmacist about Flu shot and cv19 shot, alo talking about vertigo, and patient was told he shouldn't take aleve due to minor stroke and vertigo, please advise.

## 2022-06-15 NOTE — Telephone Encounter (Signed)
Called and spoke to patient about the rosuvastatin per Herb Grays we can switch him to atorvastatin. Patient wanted to know what are the side effects to the drug and he was notified that it is a statin drug and he can have the same to no side effects to the drug and patient declined cholesterol medication. Also, notified patient that any one who had a stroke can not take anti-inflammatory medication. Patient stated that no one has told him that before and he has been taking aleve for a long time and he had a mini stroke in 2009. Per Herb Grays we told patient to stop taking aleve or any anti-inflammatory medication. Patient verbalized understanding.

## 2022-09-30 ENCOUNTER — Ambulatory Visit: Payer: PRIVATE HEALTH INSURANCE | Admitting: Nurse Practitioner

## 2022-10-04 ENCOUNTER — Other Ambulatory Visit: Payer: Self-pay | Admitting: Nurse Practitioner

## 2022-10-04 DIAGNOSIS — F430 Acute stress reaction: Secondary | ICD-10-CM

## 2022-10-07 ENCOUNTER — Other Ambulatory Visit: Payer: PRIVATE HEALTH INSURANCE

## 2022-10-07 ENCOUNTER — Other Ambulatory Visit: Payer: Self-pay | Admitting: Nurse Practitioner

## 2022-10-07 DIAGNOSIS — E1169 Type 2 diabetes mellitus with other specified complication: Secondary | ICD-10-CM

## 2022-10-07 DIAGNOSIS — E1159 Type 2 diabetes mellitus with other circulatory complications: Secondary | ICD-10-CM

## 2022-10-08 LAB — COMPREHENSIVE METABOLIC PANEL
ALT: 31 IU/L (ref 0–44)
AST: 21 IU/L (ref 0–40)
Albumin/Globulin Ratio: 2.1 (ref 1.2–2.2)
Albumin: 4.7 g/dL (ref 3.9–4.9)
Alkaline Phosphatase: 63 IU/L (ref 44–121)
BUN/Creatinine Ratio: 12 (ref 10–24)
BUN: 11 mg/dL (ref 8–27)
Bilirubin Total: 0.5 mg/dL (ref 0.0–1.2)
CO2: 23 mmol/L (ref 20–29)
Calcium: 9.6 mg/dL (ref 8.6–10.2)
Chloride: 103 mmol/L (ref 96–106)
Creatinine, Ser: 0.91 mg/dL (ref 0.76–1.27)
Globulin, Total: 2.2 g/dL (ref 1.5–4.5)
Glucose: 158 mg/dL — ABNORMAL HIGH (ref 70–99)
Potassium: 4.4 mmol/L (ref 3.5–5.2)
Sodium: 140 mmol/L (ref 134–144)
Total Protein: 6.9 g/dL (ref 6.0–8.5)
eGFR: 95 mL/min/{1.73_m2} (ref >59)

## 2022-10-08 LAB — LIPID PANEL
Chol/HDL Ratio: 2.5 ratio (ref 0.0–5.0)
Cholesterol, Total: 106 mg/dL (ref 100–199)
HDL: 42 mg/dL (ref >39)
LDL Chol Calc (NIH): 46 mg/dL (ref 0–99)
Triglycerides: 95 mg/dL (ref 0–149)
VLDL Cholesterol Cal: 18 mg/dL (ref 5–40)

## 2022-10-08 LAB — HEMOGLOBIN A1C
Est. average glucose Bld gHb Est-mCnc: 171 mg/dL
Hgb A1c MFr Bld: 7.6 % — ABNORMAL HIGH (ref 4.8–5.6)

## 2022-10-11 ENCOUNTER — Encounter: Payer: Self-pay | Admitting: Nurse Practitioner

## 2022-10-11 ENCOUNTER — Ambulatory Visit (INDEPENDENT_AMBULATORY_CARE_PROVIDER_SITE_OTHER): Payer: PRIVATE HEALTH INSURANCE | Admitting: Nurse Practitioner

## 2022-10-11 VITALS — BP 128/67 | HR 65 | Ht 72.0 in | Wt 235.8 lb

## 2022-10-11 DIAGNOSIS — E1169 Type 2 diabetes mellitus with other specified complication: Secondary | ICD-10-CM | POA: Diagnosis not present

## 2022-10-11 DIAGNOSIS — I152 Hypertension secondary to endocrine disorders: Secondary | ICD-10-CM

## 2022-10-11 DIAGNOSIS — E1159 Type 2 diabetes mellitus with other circulatory complications: Secondary | ICD-10-CM | POA: Diagnosis not present

## 2022-10-11 DIAGNOSIS — I2510 Atherosclerotic heart disease of native coronary artery without angina pectoris: Secondary | ICD-10-CM | POA: Diagnosis not present

## 2022-10-11 DIAGNOSIS — E785 Hyperlipidemia, unspecified: Secondary | ICD-10-CM

## 2022-10-11 LAB — POCT UA - MICROALBUMIN
Albumin/Creatinine Ratio, Urine, POC: 30
Creatinine, POC: 50 mg/dL
Microalbumin Ur, POC: 10 mg/L

## 2022-10-11 NOTE — Progress Notes (Signed)
Established patient visit   Patient: Nathan Kelley   DOB: 08-19-1961   62 y.o. Male  MRN: [id number] Visit Date: 10/11/2022   Chief Complaint  Patient presents with   Follow-up   Subjective    HPI  Follow up -type 2 diabetes --HgbA1c 7.6  --microalbumin is normal today  -lipids and CMP essentially normal.  -hypertension  --generally well controlled -states that he has cut down alcohol consumption quite a bit -working out 3 to 4 times per week  -He denies chest pain, chest pressure, or shortness of breath. He denies headaches or visual disturbances. He denies abdominal pain, nausea, vomiting, or changes in bowel or bladder habits.     Medications: Outpatient Medications Prior to Visit  Medication Sig   acetaminophen (TYLENOL) 500 MG tablet Take 1,000 mg by mouth every 6 (six) hours as needed for mild pain or headache.   AMBULATORY NON FORMULARY MEDICATION Single glucometer with lancets, test strips   aspirin 325 MG tablet Take 325 mg by mouth daily.   FLUoxetine (PROZAC) 20 MG capsule TAKE 1 CAPSULE (20 MG TOTAL) BY MOUTH DAILY.   loperamide (IMODIUM) 2 MG capsule Take 4 mg by mouth as needed for diarrhea or loose stools.   meclizine (ANTIVERT) 25 MG tablet Take 1 tablet (25 mg total) by mouth 3 (three) times daily as needed for dizziness.   metFORMIN (GLUCOPHAGE) 500 MG tablet Take 2 tabs by mouth in the morning and 1 tablet in the evening.   ondansetron (ZOFRAN) 4 MG tablet Take 1 tablet (4 mg total) by mouth every 8 (eight) hours as needed for nausea or vomiting.   OVER THE COUNTER MEDICATION Apply 1 application topically daily as needed (pain). CBD cream   [DISCONTINUED] losartan (COZAAR) 100 MG tablet Take 1 tablet (100 mg total) by mouth daily.   metoprolol tartrate (LOPRESSOR) 100 MG tablet Take 1 tablet (100 mg total) by mouth once for 1 dose. HOURS BEFORE CARDIAC CTA   rosuvastatin (CRESTOR) 20 MG tablet Take 1 tablet (20 mg total) by mouth daily.   [DISCONTINUED]  naproxen sodium (ALEVE) 220 MG tablet Take 220 mg by mouth as needed (pain).   No facility-administered medications prior to visit.    Review of Systems See HPI     Last CBC Lab Results  Component Value Date   WBC 9.3 05/18/2022   HGB 14.6 05/18/2022   HCT 43.0 05/18/2022   MCV 90.5 05/18/2022   MCH 33.4 05/18/2022   RDW 11.7 05/18/2022   PLT 224 [id number]   Last metabolic panel Lab Results  Component Value Date   GLUCOSE 158 (H) 10/07/2022   NA 140 10/07/2022   K 4.4 10/07/2022   CL 103 10/07/2022   CO2 23 10/07/2022   BUN 11 10/07/2022   CREATININE 0.91 10/07/2022   EGFR 95 10/07/2022   CALCIUM 9.6 10/07/2022   PROT 6.9 10/07/2022   ALBUMIN 4.7 10/07/2022   LABGLOB 2.2 10/07/2022   AGRATIO 2.1 10/07/2022   BILITOT 0.5 10/07/2022   ALKPHOS 63 10/07/2022   AST 21 10/07/2022   ALT 31 10/07/2022   ANIONGAP 9 05/18/2022   Last lipids Lab Results  Component Value Date   CHOL 106 10/07/2022   HDL 42 10/07/2022   LDLCALC 46 10/07/2022   TRIG 95 10/07/2022   CHOLHDL 2.5 10/07/2022   Last hemoglobin A1c Lab Results  Component Value Date   HGBA1C 7.6 (H) 10/07/2022   Last thyroid functions Lab Results  Component Value Date  TSH 3.000 01/28/2022   Last vitamin D Lab Results  Component Value Date   VD25OH 44.3 01/02/2020       Objective     Today's Vitals   10/11/22 1505  BP: 128/67  Pulse: 65  SpO2: 98%  Weight: 235 lb 12.8 oz (107 kg)  Height: 6' (1.829 m)   Body mass index is 31.98 kg/m.  BP Readings from Last 3 Encounters:  10/11/22 128/67  05/31/22 116/73  05/18/22 124/65    Wt Readings from Last 3 Encounters:  10/11/22 235 lb 12.8 oz (107 kg)  05/31/22 236 lb 2.1 oz (107.1 kg)  01/28/22 242 lb (109.8 kg)    Physical Exam Vitals and nursing note reviewed.  Constitutional:      Appearance: Normal appearance. He is well-developed.  HENT:     Head: Normocephalic and atraumatic.     Nose: Nose normal.     Mouth/Throat:      Mouth: Mucous membranes are moist.     Pharynx: Oropharynx is clear.  Eyes:     Extraocular Movements: Extraocular movements intact.     Conjunctiva/sclera: Conjunctivae normal.     Pupils: Pupils are equal, round, and reactive to light.  Cardiovascular:     Rate and Rhythm: Normal rate and regular rhythm.     Pulses: Normal pulses.     Heart sounds: Normal heart sounds.  Pulmonary:     Effort: Pulmonary effort is normal.     Breath sounds: Normal breath sounds.  Abdominal:     Palpations: Abdomen is soft.  Musculoskeletal:        General: Normal range of motion.     Cervical back: Normal range of motion and neck supple.  Lymphadenopathy:     Cervical: No cervical adenopathy.  Skin:    General: Skin is warm and dry.     Capillary Refill: Capillary refill takes less than 2 seconds.  Neurological:     General: No focal deficit present.     Mental Status: He is alert and oriented to person, place, and time.  Psychiatric:        Mood and Affect: Mood normal.        Behavior: Behavior normal.        Thought Content: Thought content normal.        Judgment: Judgment normal.     Results for orders placed or performed in visit on 10/11/22  POCT UA - Microalbumin  Result Value Ref Range   Microalbumin Ur, POC 10 mg/L   Creatinine, POC 50 mg/dL   Albumin/Creatinine Ratio, Urine, POC 30     Assessment & Plan    1. Type 2 diabetes mellitus with other specified complication, without long-term current use of insulin (HCC) Recent HgbA1c 7.6 with normal urine microalbumin. Continue diabetic medication as prescfribed  - POCT UA - Microalbumin  2. Hypertension associated with diabetes ([place]) Stable. Continue bp medication as prescribed  - POCT UA - Microalbumin  3. Hyperlipidemia associated with type 2 diabetes mellitus ([place]) Continue statin as prescribed.   4. Atherosclerotic cardiovascular disease Continue regular visits with cardiology as scheduled.    Problem List Items  Addressed This Visit       Cardiovascular and Mediastinum   Hypertension associated with diabetes ([place]) (Chronic)   Relevant Orders   POCT UA - Microalbumin (Completed)     Endocrine   Diabetes mellitus ([place])- diet controlled - Primary (Chronic)   Relevant Orders   POCT UA - Microalbumin (Completed)  Hyperlipidemia associated with type 2 diabetes mellitus ([place])   Other Visit Diagnoses     Atherosclerotic cardiovascular disease            Return in about 4 months (around 02/09/2023) for health maintenance exam, check HgbA1c.         Ronnell Freshwater, NP  [facility] at [facility] [phone] (phone) [phone] (fax)  [place]

## 2022-10-27 ENCOUNTER — Other Ambulatory Visit: Payer: Self-pay | Admitting: Nurse Practitioner

## 2022-10-27 DIAGNOSIS — E1159 Type 2 diabetes mellitus with other circulatory complications: Secondary | ICD-10-CM

## 2022-11-06 DIAGNOSIS — E1169 Type 2 diabetes mellitus with other specified complication: Secondary | ICD-10-CM | POA: Insufficient documentation

## 2023-01-06 ENCOUNTER — Other Ambulatory Visit: Payer: Self-pay | Admitting: Nurse Practitioner

## 2023-01-06 DIAGNOSIS — E1169 Type 2 diabetes mellitus with other specified complication: Secondary | ICD-10-CM

## 2023-01-09 ENCOUNTER — Telehealth: Payer: Self-pay

## 2023-01-09 DIAGNOSIS — E1169 Type 2 diabetes mellitus with other specified complication: Secondary | ICD-10-CM

## 2023-01-09 MED ORDER — METFORMIN HCL 500 MG PO TABS: ORAL_TABLET | ORAL | 0 refills | Status: DC

## 2023-01-09 NOTE — Telephone Encounter (Signed)
Resent corrected prescription to the pharmacy.

## 2023-01-09 NOTE — Telephone Encounter (Signed)
Pharmacy called stating that the pt doesn't take   metFORMIN (GLUCOPHAGE) 500 MG tablet as its written metFORMIN (GLUCOPHAGE) 500 MG tablet   Pt states its been changed to 2 TABLETS IN THE MORNING AND 1 TABLET IN THE EVENING.  Please contact pharmacy with update.

## 2023-02-09 ENCOUNTER — Encounter: Payer: Self-pay | Admitting: Family Medicine

## 2023-02-09 ENCOUNTER — Ambulatory Visit (INDEPENDENT_AMBULATORY_CARE_PROVIDER_SITE_OTHER): Payer: PRIVATE HEALTH INSURANCE | Admitting: Family Medicine

## 2023-02-09 VITALS — BP 137/82 | HR 62 | Resp 18 | Ht 72.0 in | Wt 226.0 lb

## 2023-02-09 DIAGNOSIS — Z0000 Encounter for general adult medical examination without abnormal findings: Secondary | ICD-10-CM

## 2023-02-09 DIAGNOSIS — E1159 Type 2 diabetes mellitus with other circulatory complications: Secondary | ICD-10-CM

## 2023-02-09 DIAGNOSIS — E669 Obesity, unspecified: Secondary | ICD-10-CM

## 2023-02-09 DIAGNOSIS — I152 Hypertension secondary to endocrine disorders: Secondary | ICD-10-CM

## 2023-02-09 DIAGNOSIS — F411 Generalized anxiety disorder: Secondary | ICD-10-CM

## 2023-02-09 DIAGNOSIS — E1169 Type 2 diabetes mellitus with other specified complication: Secondary | ICD-10-CM

## 2023-02-09 DIAGNOSIS — E785 Hyperlipidemia, unspecified: Secondary | ICD-10-CM

## 2023-02-09 DIAGNOSIS — Z7984 Long term (current) use of oral hypoglycemic drugs: Secondary | ICD-10-CM

## 2023-02-09 MED ORDER — METFORMIN HCL 500 MG PO TABS: ORAL_TABLET | ORAL | 0 refills | Status: DC

## 2023-02-09 MED ORDER — FLUOXETINE HCL 20 MG PO CAPS: 20.0000 mg | ORAL_CAPSULE | Freq: Every day | ORAL | 1 refills | Status: DC

## 2023-02-09 MED ORDER — LOSARTAN POTASSIUM 100 MG PO TABS: 100.0000 mg | ORAL_TABLET | Freq: Every day | ORAL | 1 refills | Status: DC

## 2023-02-09 NOTE — Assessment & Plan Note (Addendum)
GAD-7 score of 0, stable. Continue prozac 20 mg daily. Will continue to monitor.

## 2023-02-09 NOTE — Assessment & Plan Note (Signed)
Continue weight management efforts.

## 2023-02-09 NOTE — Progress Notes (Signed)
Complete physical exam  Patient: Nathan Kelley   DOB: 1961-04-21   62 y.o. Male  MRN: [id number]  Subjective:    Chief Complaint  Patient presents with   Annual Exam    Nathan Kelley is a 62 y.o. male who presents today for a complete physical exam. He reports consuming a low sodium and low carb  diet. Gym/ health club routine includes cardio, mod to heavy weightlifting, and golf. He generally feels well. He reports sleeping fairly well. He does not have additional problems to discuss today.    Most recent fall risk assessment:    02/09/2023    9:18 AM  Fall Risk   Falls in the past year? 0  Number falls in past yr: 0  Injury with Fall? 0  Risk for fall due to : No Fall Risks  Follow up Falls evaluation completed     Most recent depression and anxiety screenings:    02/09/2023    9:18 AM 10/11/2022    3:06 PM  PHQ 2/9 Scores  PHQ - 2 Score 0 0  PHQ- 9 Score 0 1      10/11/2022    3:06 PM 05/31/2022    9:09 AM 01/28/2022    8:49 AM 09/08/2021   10:20 AM  GAD 7 : Generalized Anxiety Score  Nervous, Anxious, on Edge 0 0 0 0  Control/stop worrying 0 0 0 0  Worry too much - different things 0 0 0 1  Trouble relaxing 0 0 0 0  Restless 0 0 0 0  Easily annoyed or irritable 0 0 0 0  Afraid - awful might happen 0 0 0 0  Total GAD 7 Score 0 0 0 1  Anxiety Difficulty   Not difficult at all Not difficult at all    Patient Active Problem List   Diagnosis Date Noted   Hyperlipidemia associated with type 2 diabetes mellitus (HCC) 11/06/2022   Family history of early CAD-  Dad died age 31 AMI w CHF Oct 15, 2019   GAD (generalized anxiety disorder) 10-15-19   Morbid obesity (HCC)- bmi 30+ with DM, HTN, h/o TIA 12/03/2018   Chronic pansinusitis 11/27/2017   Diabetes mellitus (HCC)- diet controlled 11/27/2017   Family history of diabetes mellitus- Mom and sister 02/16/2017   Obesity 10/13/2015   Neuropathic pain of right foot 10/13/2015   Sleep disorder 06/25/2014    Hypertension associated with diabetes (HCC) 11/26/2009   ANXIETY 07/13/2009   Personal history of TIA (transient ischemic attack) 12/23/2008   GERD 10/10/2007   Other dysphagia 10/10/2007    Past Surgical History:  Procedure Laterality Date   COLONOSCOPY  1989   Dr [facility] for severe diarrhea   COSMETIC SURGERY     on ears   CYSTECTOMY     R 5th finger   donor graft     from hip to 5th finger   REFRACTIVE SURGERY     UPPER GI ENDOSCOPY  1989   WISDOM TOOTH EXTRACTION     Social History   Tobacco Use   Smoking status: Never    Passive exposure: Never   Smokeless tobacco: Former    Quit date: 09/05/2005  Vaping Use   Vaping Use: Never used  Substance Use Topics   Alcohol use: Yes    Alcohol/week: 14.0 standard drinks of alcohol    Types: 14 Glasses of wine per week    Comment: 2 glasses/ night   Drug use: No   Family History  Problem Relation Age of Onset   Diabetes Mother    Stroke Mother        late 3's   Heart attack Father 51   Hypertension Sister    Stroke Sister        mid - late 25s   Hypertension Brother    Colitis Brother        2 bros   Ulcers Paternal Grandmother    Colon cancer Neg Hx    No Known Allergies   Patient Care Team: Melida Quitter, PA as PCP - General (Family Medicine)   Outpatient Medications Prior to Visit  Medication Sig   AMBULATORY NON FORMULARY MEDICATION Single glucometer with lancets, test strips   aspirin 325 MG tablet Take 325 mg by mouth daily.   loperamide (IMODIUM) 2 MG capsule Take 4 mg by mouth as needed for diarrhea or loose stools.   meclizine (ANTIVERT) 25 MG tablet Take 1 tablet (25 mg total) by mouth 3 (three) times daily as needed for dizziness.   ondansetron (ZOFRAN) 4 MG tablet Take 1 tablet (4 mg total) by mouth every 8 (eight) hours as needed for nausea or vomiting.   OVER THE COUNTER MEDICATION Apply 1 application topically daily as needed (pain). CBD cream   [DISCONTINUED] FLUoxetine (PROZAC) 20 MG  capsule TAKE 1 CAPSULE (20 MG TOTAL) BY MOUTH DAILY.   [DISCONTINUED] losartan (COZAAR) 100 MG tablet TAKE 1 TABLET (100 MG TOTAL) BY MOUTH DAILY.   [DISCONTINUED] metFORMIN (GLUCOPHAGE) 500 MG tablet TAKE 2 TABLETS (1000 MG) IN THE MORNING AND 1 TABLET (500 MG) IN THE EVENING.   metoprolol tartrate (LOPRESSOR) 100 MG tablet Take 1 tablet (100 mg total) by mouth once for 1 dose. HOURS BEFORE CARDIAC CTA   [DISCONTINUED] acetaminophen (TYLENOL) 500 MG tablet Take 1,000 mg by mouth every 6 (six) hours as needed for mild pain or headache.   No facility-administered medications prior to visit.    Review of Systems  Constitutional:  Negative for chills, fever and malaise/fatigue.  HENT:  Negative for congestion and hearing loss.   Eyes:  Negative for blurred vision and double vision.  Respiratory:  Negative for cough and shortness of breath.   Cardiovascular:  Negative for chest pain, palpitations and leg swelling.  Gastrointestinal:  Negative for abdominal pain, constipation, diarrhea and heartburn.  Genitourinary:  Negative for frequency and urgency.  Musculoskeletal:  Negative for myalgias and neck pain.  Neurological:  Negative for headaches.  Endo/Heme/Allergies:  Negative for polydipsia.  Psychiatric/Behavioral:  Negative for depression. The patient is not nervous/anxious and does not have insomnia.       Objective:    BP 137/82 (BP Location: Left Arm, Patient Position: Sitting, Cuff Size: Normal)   Pulse 62   Resp 18   Ht 6' (1.829 m)   Wt 226 lb (102.5 kg)   SpO2 97%   BMI 30.65 kg/m    Physical Exam Constitutional:      General: He is not in acute distress.    Appearance: Normal appearance.  HENT:     Head: Normocephalic and atraumatic.     Right Ear: Tympanic membrane, ear canal and external ear normal.     Left Ear: Tympanic membrane, ear canal and external ear normal.     Nose: Nose normal. No congestion or rhinorrhea.     Mouth/Throat:     Mouth: Mucous membranes  are moist.     Pharynx: Oropharynx is clear. No oropharyngeal exudate or posterior oropharyngeal erythema.  Eyes:     Extraocular Movements: Extraocular movements intact.     Conjunctiva/sclera: Conjunctivae normal.     Pupils: Pupils are equal, round, and reactive to light.  Neck:     Thyroid: No thyroid tenderness.  Cardiovascular:     Rate and Rhythm: Normal rate and regular rhythm.     Pulses: Normal pulses.     Heart sounds: No murmur heard.    No friction rub. No gallop.  Pulmonary:     Effort: Pulmonary effort is normal.     Breath sounds: Normal breath sounds. No wheezing, rhonchi or rales.  Abdominal:     General: Bowel sounds are normal.     Palpations: Abdomen is soft. There is no mass.     Tenderness: There is no abdominal tenderness. There is no guarding.  Musculoskeletal:        General: No swelling. Normal range of motion.     Cervical back: Normal range of motion and neck supple.     Right lower leg: No edema.     Left lower leg: No edema.  Lymphadenopathy:     Cervical: No cervical adenopathy.  Skin:    General: Skin is warm and dry.  Neurological:     Mental Status: He is alert and oriented to person, place, and time.     Cranial Nerves: No cranial nerve deficit.     Motor: No weakness.     Deep Tendon Reflexes: Reflexes normal.  Psychiatric:        Mood and Affect: Mood normal.       Assessment & Plan:    Routine Health Maintenance and Physical Exam  Immunization History  Administered Date(s) Administered   Influenza Whole 07/13/2009, 06/05/2012   Influenza,inj,Quad PF,6+ Mos 06/28/2017, 07/03/2018, 06/14/2019, 07/13/2020, 05/31/2021   Influenza-Unspecified 06/20/2015, 08/08/2016   PFIZER(Purple Top)SARS-COV-2 Vaccination 11/22/2019, 12/13/2019, 07/21/2020   Tdap 11/27/2017   Zoster Recombinat (Shingrix) 01/02/2020, 03/10/2020    Health Maintenance  Topic Date Due   OPHTHALMOLOGY EXAM  01/03/2019   FOOT EXAM  01/26/2022   COVID-19 Vaccine (4  - 2023-24 season) 05/06/2022   INFLUENZA VACCINE  04/06/2023   HEMOGLOBIN A1C  04/07/2023   Diabetic kidney evaluation - eGFR measurement  10/08/2023   Diabetic kidney evaluation - Urine ACR  10/12/2023   Colonoscopy  10/20/2024   DTaP/Tdap/Td (2 - Td or Tdap) 11/28/2027   Hepatitis C Screening  Completed   HIV Screening  Completed   Zoster Vaccines- Shingrix  Completed   HPV VACCINES  Aged Out   Patient is aware that he is due for diabetic eye exam. Plans to have it done in the next few months.  Discussed health benefits of physical activity, and encouraged him to engage in regular exercise appropriate for his age and condition.  Wellness examination -     CBC with Differential/Platelet; Future -     Comprehensive metabolic panel; Future -     Hemoglobin A1c; Future -     Lipid panel; Future -     VITAMIN D 25 Hydroxy (Vit-D Deficiency, Fractures); Future -     TSH; Future -     T4, free; Future  Hypertension associated with diabetes (HCC) Assessment & Plan: BP goal <130/80. Above goal initially, on repeat close to goal 137/82. Continue losartan 100 mg daily. Encouraged to continue ambulatory BP monitoring, if consistently above 130/80 try switching losartan to morning dose. Collecting CMP for medication monitoring. Will continue to monitor.   Orders: -  CBC with Differential/Platelet; Future -     Comprehensive metabolic panel; Future -     Losartan Potassium; Take 1 tablet (100 mg total) by mouth daily.  Dispense: 90 tablet; Refill: 1  Hyperlipidemia associated with type 2 diabetes mellitus (HCC) Assessment & Plan: Last lipid panel: LDL 46, HDL 42, trigs 95. Repeating lipid panel today.  Orders: -     Lipid panel; Future  Type 2 diabetes mellitus with other specified complication, without long-term current use of insulin (HCC) Assessment & Plan: Last A1c 7.6, repeating today. Continue low carb diet and routine physical activity. Continue metformin 1000 mg daily in the  morning and 500 mg daily in the evening.   Orders: -     Hemoglobin A1c; Future -     metFORMIN HCl; TAKE 2 TABLETS (1000 MG) IN THE MORNING AND 1 TABLET (500 MG) IN THE EVENING.  Dispense: 270 tablet; Refill: 0  GAD (generalized anxiety disorder) Assessment & Plan: GAD-7 score of 0, stable. Continue prozac 20 mg daily. Will continue to monitor.  Orders: -     FLUoxetine HCl; Take 1 capsule (20 mg total) by mouth daily.  Dispense: 90 capsule; Refill: 1  Class 1 obesity without serious comorbidity with body mass index (BMI) of 32.0 to 32.9 in adult, unspecified obesity type Assessment & Plan: Continue weight management efforts.  Orders: -     CBC with Differential/Platelet; Future -     Comprehensive metabolic panel; Future -     Hemoglobin A1c; Future -     Lipid panel; Future -     VITAMIN D 25 Hydroxy (Vit-D Deficiency, Fractures); Future -     TSH; Future -     T4, free; Future    Return in about 4 months (around 06/11/2023) for follow-up for HTN, DM, fasting blood work 1 week before.     Melida Quitter, PA

## 2023-02-09 NOTE — Assessment & Plan Note (Addendum)
BP goal <130/80. Above goal initially, on repeat close to goal 137/82. Continue losartan 100 mg daily. Encouraged to continue ambulatory BP monitoring, if consistently above 130/80 try switching losartan to morning dose. Collecting CMP for medication monitoring. Will continue to monitor.

## 2023-02-09 NOTE — Patient Instructions (Addendum)
Keep up the great work! Check your blood pressure at home periodically. If it is consistently above 130/80, please let me know. You can also try switching your losartan to the morning to see if this keeps blood pressure lower during the day while you are active.  Once you have your eye exam completed, you will be up to date on all preventative care.

## 2023-02-09 NOTE — Assessment & Plan Note (Signed)
Last A1c 7.6, repeating today. Continue low carb diet and routine physical activity. Continue metformin 1000 mg daily in the morning and 500 mg daily in the evening.

## 2023-02-09 NOTE — Assessment & Plan Note (Signed)
Last lipid panel: LDL 46, HDL 42, trigs 95. Repeating lipid panel today.

## 2023-02-09 NOTE — Assessment & Plan Note (Signed)
>>  ASSESSMENT AND PLAN FOR OBESITY WRITTEN ON 02/09/2023  9:52 AM BY Ramesha Poster A, PA  Continue weight management efforts.

## 2023-02-10 LAB — COMPREHENSIVE METABOLIC PANEL
ALT: 26 IU/L (ref 0–44)
AST: 21 IU/L (ref 0–40)
Albumin/Globulin Ratio: 1.8 (ref 1.2–2.2)
Albumin: 4.6 g/dL (ref 3.9–4.9)
Alkaline Phosphatase: 65 IU/L (ref 44–121)
BUN/Creatinine Ratio: 16 (ref 10–24)
BUN: 15 mg/dL (ref 8–27)
Bilirubin Total: 0.5 mg/dL (ref 0.0–1.2)
CO2: 24 mmol/L (ref 20–29)
Calcium: 9.9 mg/dL (ref 8.6–10.2)
Chloride: 104 mmol/L (ref 96–106)
Creatinine, Ser: 0.91 mg/dL (ref 0.76–1.27)
Globulin, Total: 2.6 g/dL (ref 1.5–4.5)
Glucose: 125 mg/dL — ABNORMAL HIGH (ref 70–99)
Potassium: 4.8 mmol/L (ref 3.5–5.2)
Sodium: 142 mmol/L (ref 134–144)
Total Protein: 7.2 g/dL (ref 6.0–8.5)
eGFR: 95 mL/min/{1.73_m2} (ref >59)

## 2023-02-10 LAB — CBC WITH DIFFERENTIAL/PLATELET
Basophils Absolute: 0 10*3/uL (ref 0.0–0.2)
Basos: 1 %
EOS (ABSOLUTE): 0.1 10*3/uL (ref 0.0–0.4)
Eos: 1 %
Hematocrit: 45.4 % (ref 37.5–51.0)
Hemoglobin: 16.2 g/dL (ref 13.0–17.7)
Immature Grans (Abs): 0 10*3/uL (ref 0.0–0.1)
Immature Granulocytes: 1 %
Lymphocytes Absolute: 1.7 10*3/uL (ref 0.7–3.1)
Lymphs: 24 %
MCH: 31.1 pg (ref 26.6–33.0)
MCHC: 35.7 g/dL (ref 31.5–35.7)
MCV: 87 fL (ref 79–97)
Monocytes Absolute: 0.5 10*3/uL (ref 0.1–0.9)
Monocytes: 7 %
Neutrophils Absolute: 4.9 10*3/uL (ref 1.4–7.0)
Neutrophils: 66 %
Platelets: 258 10*3/uL (ref 150–450)
RBC: 5.21 x10E6/uL (ref 4.14–5.80)
RDW: 13.1 % (ref 11.6–15.4)
WBC: 7.3 10*3/uL (ref 3.4–10.8)

## 2023-02-10 LAB — HEMOGLOBIN A1C
Est. average glucose Bld gHb Est-mCnc: 163 mg/dL
Hgb A1c MFr Bld: 7.3 % — ABNORMAL HIGH (ref 4.8–5.6)

## 2023-02-10 LAB — LIPID PANEL
Chol/HDL Ratio: 2.1 ratio (ref 0.0–5.0)
Cholesterol, Total: 110 mg/dL (ref 100–199)
HDL: 52 mg/dL (ref >39)
LDL Chol Calc (NIH): 42 mg/dL (ref 0–99)
Triglycerides: 76 mg/dL (ref 0–149)
VLDL Cholesterol Cal: 16 mg/dL (ref 5–40)

## 2023-02-10 LAB — VITAMIN D 25 HYDROXY (VIT D DEFICIENCY, FRACTURES): Vit D, 25-Hydroxy: 56.9 ng/mL (ref 30.0–100.0)

## 2023-02-10 LAB — TSH: TSH: 2.86 u[IU]/mL (ref 0.450–4.500)

## 2023-02-10 LAB — T4, FREE: Free T4: 1.03 ng/dL (ref 0.82–1.77)

## 2023-03-01 ENCOUNTER — Other Ambulatory Visit: Payer: Self-pay | Admitting: Family Medicine

## 2023-03-01 ENCOUNTER — Telehealth: Payer: Self-pay

## 2023-03-01 DIAGNOSIS — E1169 Type 2 diabetes mellitus with other specified complication: Secondary | ICD-10-CM

## 2023-03-01 MED ORDER — METFORMIN HCL 500 MG PO TABS: ORAL_TABLET | ORAL | 0 refills | Status: DC

## 2023-03-01 NOTE — Telephone Encounter (Signed)
Refill sent.

## 2023-03-01 NOTE — Telephone Encounter (Signed)
Prescription Request  03/01/2023  LOV: 02/09/23  What is the name of the medication or equipment? metFORMIN (GLUCOPHAGE) 500 MG tablet   Have you contacted your pharmacy to request a refill? Yes   Which pharmacy would you like this sent to?  [facility] - [place], [place] - [address] [place] [address] Phone: [phone] Fax: [phone]    Patient notified that their request is being sent to the clinical staff for review and that they should receive a response within 2 business days.   Please advise at Mobile [phone] (mobile)  Pt states that he is completely out of medication.

## 2023-03-28 LAB — HM DIABETES EYE EXAM

## 2023-05-29 ENCOUNTER — Other Ambulatory Visit: Payer: Self-pay | Admitting: Family Medicine

## 2023-05-29 DIAGNOSIS — I152 Hypertension secondary to endocrine disorders: Secondary | ICD-10-CM

## 2023-05-29 DIAGNOSIS — E1169 Type 2 diabetes mellitus with other specified complication: Secondary | ICD-10-CM

## 2023-06-05 ENCOUNTER — Other Ambulatory Visit: Payer: PRIVATE HEALTH INSURANCE

## 2023-06-05 DIAGNOSIS — E1159 Type 2 diabetes mellitus with other circulatory complications: Secondary | ICD-10-CM

## 2023-06-05 DIAGNOSIS — E1169 Type 2 diabetes mellitus with other specified complication: Secondary | ICD-10-CM

## 2023-06-06 LAB — COMPREHENSIVE METABOLIC PANEL
ALT: 19 [IU]/L (ref 0–44)
AST: 20 [IU]/L (ref 0–40)
Albumin: 4.6 g/dL (ref 3.9–4.9)
Alkaline Phosphatase: 64 [IU]/L (ref 44–121)
BUN/Creatinine Ratio: 13 (ref 10–24)
BUN: 12 mg/dL (ref 8–27)
Bilirubin Total: 0.5 mg/dL (ref 0.0–1.2)
CO2: 21 mmol/L (ref 20–29)
Calcium: 9.3 mg/dL (ref 8.6–10.2)
Chloride: 104 mmol/L (ref 96–106)
Creatinine, Ser: 0.9 mg/dL (ref 0.76–1.27)
Globulin, Total: 2.1 g/dL (ref 1.5–4.5)
Glucose: 130 mg/dL — ABNORMAL HIGH (ref 70–99)
Potassium: 4.3 mmol/L (ref 3.5–5.2)
Sodium: 140 mmol/L (ref 134–144)
Total Protein: 6.7 g/dL (ref 6.0–8.5)
eGFR: 96 mL/min/{1.73_m2} (ref >59)

## 2023-06-06 LAB — HEMOGLOBIN A1C
Est. average glucose Bld gHb Est-mCnc: 137 mg/dL
Hgb A1c MFr Bld: 6.4 % — ABNORMAL HIGH (ref 4.8–5.6)

## 2023-06-12 ENCOUNTER — Ambulatory Visit (INDEPENDENT_AMBULATORY_CARE_PROVIDER_SITE_OTHER): Payer: PRIVATE HEALTH INSURANCE | Admitting: Family Medicine

## 2023-06-12 ENCOUNTER — Encounter: Payer: Self-pay | Admitting: Family Medicine

## 2023-06-12 VITALS — BP 134/77 | HR 67 | Resp 18 | Ht 72.0 in | Wt 230.0 lb

## 2023-06-12 DIAGNOSIS — Z23 Encounter for immunization: Secondary | ICD-10-CM | POA: Diagnosis not present

## 2023-06-12 DIAGNOSIS — Z7984 Long term (current) use of oral hypoglycemic drugs: Secondary | ICD-10-CM

## 2023-06-12 DIAGNOSIS — I152 Hypertension secondary to endocrine disorders: Secondary | ICD-10-CM | POA: Diagnosis not present

## 2023-06-12 DIAGNOSIS — E1159 Type 2 diabetes mellitus with other circulatory complications: Secondary | ICD-10-CM | POA: Diagnosis not present

## 2023-06-12 DIAGNOSIS — E1169 Type 2 diabetes mellitus with other specified complication: Secondary | ICD-10-CM | POA: Diagnosis not present

## 2023-06-12 NOTE — Progress Notes (Signed)
Established Patient Office Visit  Subjective   Patient ID: Nathan Kelley, male    DOB: September 01, 1961  Age: 62 y.o. MRN: [id number]  Chief Complaint  Patient presents with   Diabetes   Hypertension    HPI Nathan Kelley is a 62 y.o. male presenting today for follow up of hypertension, diabetes.  Over the past few months, he has increased his exercise, stopped eating white bread, and switch from coffee to mushroom coffee. Hypertension: Patient here for follow-up of elevated blood pressure. He is exercising and is adherent to low salt diet.   Pt denies chest pain, SOB, dizziness, edema, syncope, fatigue or heart palpitations. Taking losartan, reports excellent compliance with treatment. Denies side effects. Diabetes: denies hypoglycemic events, wounds or sores that are not healing well, increased thirst or urination. Denies vision problems, eye exam completed at [facility] care. Checking glucose at home, ranges have been 86-140. Taking metformin as prescribed without any side effects. Has been following low-sodium, low-carb diet and regularly participates in cardio, weightlifting, and golf.  Outpatient Medications Prior to Visit  Medication Sig   AMBULATORY NON FORMULARY MEDICATION Single glucometer with lancets, test strips   aspirin 325 MG tablet Take 325 mg by mouth daily.   FLUoxetine (PROZAC) 20 MG capsule Take 1 capsule (20 mg total) by mouth daily.   loperamide (IMODIUM) 2 MG capsule Take 4 mg by mouth as needed for diarrhea or loose stools.   losartan (COZAAR) 100 MG tablet Take 1 tablet (100 mg total) by mouth daily.   meclizine (ANTIVERT) 25 MG tablet Take 1 tablet (25 mg total) by mouth 3 (three) times daily as needed for dizziness.   metFORMIN (GLUCOPHAGE) 500 MG tablet TAKE 2 TABLETS IN THE MORNING AND 2 TABLETS IN THE EVENING.   ondansetron (ZOFRAN) 4 MG tablet Take 1 tablet (4 mg total) by mouth every 8 (eight) hours as needed for nausea or vomiting.   OVER THE COUNTER  MEDICATION Apply 1 application topically daily as needed (pain). CBD cream   metoprolol tartrate (LOPRESSOR) 100 MG tablet Take 1 tablet (100 mg total) by mouth once for 1 dose. HOURS BEFORE CARDIAC CTA   No facility-administered medications prior to visit.    ROS Negative unless otherwise noted in HPI   Objective:     BP 134/77 (BP Location: Left Arm, Patient Position: Sitting, Cuff Size: Large)   Pulse 67   Resp 18   Ht 6' (1.829 m)   Wt 230 lb (104.3 kg)   SpO2 97%   BMI 31.19 kg/m   Physical Exam Constitutional:      General: He is not in acute distress.    Appearance: Normal appearance.  HENT:     Head: Normocephalic and atraumatic.  Cardiovascular:     Rate and Rhythm: Normal rate and regular rhythm.     Heart sounds: Normal heart sounds. No murmur heard.    No friction rub. No gallop.  Pulmonary:     Effort: Pulmonary effort is normal. No respiratory distress.     Breath sounds: Normal breath sounds. No wheezing, rhonchi or rales.  Skin:    General: Skin is warm and dry.  Neurological:     Mental Status: He is alert and oriented to person, place, and time.  Psychiatric:        Mood and Affect: Mood normal.    Diabetic Foot Exam - Simple   Simple Foot Form Diabetic Foot exam was performed with the following findings: Yes  06/12/2023  9:18 AM  Visual Inspection No deformities, no ulcerations, no other skin breakdown bilaterally: Yes Sensation Testing Intact to touch and monofilament testing bilaterally: Yes Pulse Check Posterior Tibialis and Dorsalis pulse intact bilaterally: Yes Comments Checks feet daily     Assessment & Plan:  Hypertension associated with diabetes (HCC) Assessment & Plan: BP goal <130/80.  Close to goal in office.  Continue losartan 100 mg daily. Encouraged to continue ambulatory BP monitoring, if still elevated at next appointment will add hydrochlorothiazide component.  CMP within normal limits.  Will continue to monitor.    Type 2  diabetes mellitus with other specified complication, without long-term current use of insulin (HCC) Assessment & Plan: A1c improved to 6.4. Continue low carb diet and routine physical activity. Continue metformin 1000 mg daily in the morning and 500 mg daily in the evening.  CMP within normal limits.  Will continue to monitor.   Morbid obesity (HCC) Assessment & Plan: Continue weight management efforts and maintaining good blood sugar balance.   Need for influenza vaccination -     Flu vaccine trivalent PF, 6mos and older(Flulaval,Afluria,Fluarix,Fluzone)    Return in about 4 months (around 10/13/2023) for follow-up for HTN, HLD, DM.    Melida Quitter, PA

## 2023-06-12 NOTE — Assessment & Plan Note (Addendum)
BP goal <130/80.  Close to goal in office.  Continue losartan 100 mg daily. Encouraged to continue ambulatory BP monitoring, if still elevated at next appointment will add hydrochlorothiazide component.  CMP within normal limits.  Will continue to monitor.

## 2023-06-12 NOTE — Assessment & Plan Note (Signed)
Continue weight management efforts and maintaining good blood sugar balance.

## 2023-06-12 NOTE — Assessment & Plan Note (Addendum)
A1c improved to 6.4. Continue low carb diet and routine physical activity. Continue metformin 1000 mg daily in the morning and 500 mg daily in the evening.  CMP within normal limits.  Will continue to monitor.

## 2023-06-12 NOTE — Patient Instructions (Signed)
Keep up the FANTASTIC work!!!

## 2023-08-29 ENCOUNTER — Other Ambulatory Visit: Payer: Self-pay | Admitting: Family Medicine

## 2023-08-29 DIAGNOSIS — E1169 Type 2 diabetes mellitus with other specified complication: Secondary | ICD-10-CM

## 2023-09-27 ENCOUNTER — Other Ambulatory Visit: Payer: Self-pay | Admitting: Family Medicine

## 2023-09-27 DIAGNOSIS — F411 Generalized anxiety disorder: Secondary | ICD-10-CM

## 2023-10-09 ENCOUNTER — Telehealth: Payer: Self-pay | Admitting: *Deleted

## 2023-10-09 NOTE — Telephone Encounter (Signed)
Copied from CRM [id number]. Topic: Clinical - Request for Lab/Test Order >> Oct 09, 2023 11:38 AM Clayton Bibles wrote: Reason for CRM: Sahmir is calling to see if he needs labs completed before his appointment on 10/13/23 appointment. There is no orders for labs. He can only do labs on 10/10/23. Please give him a call at [phone]

## 2023-10-09 NOTE — Telephone Encounter (Signed)
 Pt informed of below.

## 2023-10-09 NOTE — Telephone Encounter (Signed)
We won't need a blood draw, just a POC A1C the day of his appt

## 2023-10-12 ENCOUNTER — Encounter: Payer: Self-pay | Admitting: Family Medicine

## 2023-10-13 ENCOUNTER — Ambulatory Visit (INDEPENDENT_AMBULATORY_CARE_PROVIDER_SITE_OTHER): Payer: PRIVATE HEALTH INSURANCE | Admitting: Family Medicine

## 2023-10-13 ENCOUNTER — Encounter: Payer: Self-pay | Admitting: Family Medicine

## 2023-10-13 VITALS — BP 116/72 | HR 67 | Ht 72.0 in | Wt 229.0 lb

## 2023-10-13 DIAGNOSIS — E1169 Type 2 diabetes mellitus with other specified complication: Secondary | ICD-10-CM

## 2023-10-13 DIAGNOSIS — I152 Hypertension secondary to endocrine disorders: Secondary | ICD-10-CM | POA: Diagnosis not present

## 2023-10-13 DIAGNOSIS — E1159 Type 2 diabetes mellitus with other circulatory complications: Secondary | ICD-10-CM

## 2023-10-13 DIAGNOSIS — E785 Hyperlipidemia, unspecified: Secondary | ICD-10-CM | POA: Diagnosis not present

## 2023-10-13 DIAGNOSIS — Z7984 Long term (current) use of oral hypoglycemic drugs: Secondary | ICD-10-CM

## 2023-10-13 LAB — POCT GLYCOSYLATED HEMOGLOBIN (HGB A1C): Hemoglobin A1C: 6.7 % — AB (ref 4.0–5.6)

## 2023-10-13 LAB — POCT UA - MICROALBUMIN
Creatinine, POC: 300 mg/dL
Microalbumin Ur, POC: 80 mg/L

## 2023-10-13 MED ORDER — LOSARTAN POTASSIUM 100 MG PO TABS: 100.0000 mg | ORAL_TABLET | Freq: Every day | ORAL | 1 refills | Status: DC

## 2023-10-13 MED ORDER — METFORMIN HCL 500 MG PO TABS: ORAL_TABLET | ORAL | 1 refills | Status: DC

## 2023-10-13 NOTE — Patient Instructions (Signed)
 It has been a pleasure working with you, thank you for trusting me to be part of your healthcare team.  I wish you all the best!

## 2023-10-13 NOTE — Assessment & Plan Note (Addendum)
 Last lipid panel: LDL 42, HDL 52, triglycerides 76.  LDL well within goal.  Consider discussing recommendations for statin medication for anyone with diabetes at future appointments, particularly given his history of TIA.

## 2023-10-13 NOTE — Assessment & Plan Note (Addendum)
 A1c today. Continue low carb diet and routine physical activity. Continue metformin  1000 mg twice daily. Will continue to monitor.

## 2023-10-13 NOTE — Assessment & Plan Note (Addendum)
 BP goal <130/80.  Stable, at goal.  Continue losartan  100 mg daily. Encouraged to continue ambulatory BP monitoring.  Will continue to monitor.

## 2023-10-13 NOTE — Progress Notes (Signed)
 Established Patient Office Visit  Subjective   Patient ID: Nathan Kelley, male    DOB: Apr 11, 1961  Age: 63 y.o. MRN: [id number]  Chief Complaint  Patient presents with   Hypertension   Hyperlipidemia   Diabetes    HPI Nathan Kelley is a 63 y.o. male presenting today for follow up of hypertension, hyperlipidemia, diabetes. Hypertension: Pt denies chest pain, SOB, dizziness, edema, syncope, fatigue or heart palpitations. Taking losartan , reports excellent compliance with treatment. Denies side effects. Hyperlipidemia:  Currently consuming a  low-fat, low-carb  diet.  The ASCVD Risk score (Arnett DK, et al., 2019) failed to calculate for the following reasons:   Risk score cannot be calculated because patient has a medical history suggesting prior/existing ASCVD Diabetes: denies hypoglycemic events, wounds or sores that are not healing well, increased thirst or urination. Denies vision problems, eye exam completed at [facility] eye care. Taking metformin  as prescribed without any side effects.   Outpatient Medications Prior to Visit  Medication Sig   aspirin 325 MG tablet Take 325 mg by mouth daily.   FLUoxetine  (PROZAC ) 20 MG capsule TAKE 1 CAPSULE (20 MG TOTAL) BY MOUTH DAILY.   loperamide (IMODIUM) 2 MG capsule Take 4 mg by mouth as needed for diarrhea or loose stools.   meclizine  (ANTIVERT ) 25 MG tablet Take 1 tablet (25 mg total) by mouth 3 (three) times daily as needed for dizziness.   ondansetron  (ZOFRAN ) 4 MG tablet Take 1 tablet (4 mg total) by mouth every 8 (eight) hours as needed for nausea or vomiting.   OVER THE COUNTER MEDICATION Apply 1 application topically daily as needed (pain). CBD cream   [DISCONTINUED] losartan  (COZAAR ) 100 MG tablet Take 1 tablet (100 mg total) by mouth daily.   [DISCONTINUED] metFORMIN  (GLUCOPHAGE ) 500 MG tablet TAKE 2 TABLETS IN THE MORNING AND 2 TABLETS IN THE EVENING.   [DISCONTINUED] AMBULATORY NON FORMULARY MEDICATION Single glucometer with  lancets, test strips   [DISCONTINUED] metoprolol  tartrate (LOPRESSOR ) 100 MG tablet Take 1 tablet (100 mg total) by mouth once for 1 dose. HOURS BEFORE CARDIAC CTA   No facility-administered medications prior to visit.    ROS Negative unless otherwise noted in HPI   Objective:     BP 116/72   Pulse 67   Ht 6' (1.829 m)   Wt 229 lb (103.9 kg)   SpO2 98%   BMI 31.06 kg/m   Physical Exam Constitutional:      General: He is not in acute distress.    Appearance: Normal appearance.  HENT:     Head: Normocephalic and atraumatic.  Cardiovascular:     Rate and Rhythm: Normal rate and regular rhythm.     Heart sounds: Normal heart sounds. No murmur heard.    No friction rub. No gallop.  Pulmonary:     Effort: Pulmonary effort is normal. No respiratory distress.     Breath sounds: Normal breath sounds. No wheezing, rhonchi or rales.  Skin:    General: Skin is warm and dry.  Neurological:     Mental Status: He is alert and oriented to person, place, and time.  Psychiatric:        Mood and Affect: Mood normal.    Results for orders placed or performed in visit on 10/13/23  POCT UA - Microalbumin  Result Value Ref Range   Microalbumin Ur, POC 80 mg/L   Creatinine, POC 300 mg/dL   Albumin/Creatinine Ratio, Urine, POC >30mg /g   POCT glycosylated hemoglobin (Hb  A1C)  Result Value Ref Range   Hemoglobin A1C 6.7 (A) 4.0 - 5.6 %   HbA1c POC (<> result, manual entry)     HbA1c, POC (prediabetic range)     HbA1c, POC (controlled diabetic range)       Assessment & Plan:  Hypertension associated with diabetes (HCC) Assessment & Plan: BP goal <130/80.  Stable, at goal.  Continue losartan  100 mg daily. Encouraged to continue ambulatory BP monitoring.  Will continue to monitor.   Orders: -     Losartan  Potassium; Take 1 tablet (100 mg total) by mouth daily.  Dispense: 90 tablet; Refill: 1  Hyperlipidemia associated with type 2 diabetes mellitus (HCC) Assessment & Plan: Last  lipid panel: LDL 42, HDL 52, triglycerides 76.  LDL well within goal.  Consider discussing recommendations for statin medication for anyone with diabetes at future appointments, particularly given his history of TIA.   Type 2 diabetes mellitus with other specified complication, without long-term current use of insulin (HCC) Assessment & Plan: A1c today. Continue low carb diet and routine physical activity. Continue metformin  1000 mg twice daily. Will continue to monitor.  Orders: -     POCT UA - Microalbumin -     POCT glycosylated hemoglobin (Hb A1C) -     metFORMIN  HCl; TAKE 2 TABLETS IN THE MORNING AND 2 TABLETS IN THE EVENING.  Dispense: 360 tablet; Refill: 1    Return in about 4 months (around 02/10/2024) for follow-up for HTN, HLD, DM, fasting labs 1 week before.    Joesph DELENA Sear, PA

## 2023-10-16 ENCOUNTER — Encounter: Payer: Self-pay | Admitting: Family Medicine

## 2024-02-07 ENCOUNTER — Other Ambulatory Visit: Payer: Self-pay | Admitting: *Deleted

## 2024-02-07 DIAGNOSIS — E1159 Type 2 diabetes mellitus with other circulatory complications: Secondary | ICD-10-CM

## 2024-02-07 DIAGNOSIS — E1169 Type 2 diabetes mellitus with other specified complication: Secondary | ICD-10-CM

## 2024-02-09 ENCOUNTER — Other Ambulatory Visit: Payer: PRIVATE HEALTH INSURANCE

## 2024-02-09 DIAGNOSIS — E1169 Type 2 diabetes mellitus with other specified complication: Secondary | ICD-10-CM

## 2024-02-09 DIAGNOSIS — E1159 Type 2 diabetes mellitus with other circulatory complications: Secondary | ICD-10-CM

## 2024-02-10 ENCOUNTER — Ambulatory Visit: Payer: Self-pay

## 2024-02-10 LAB — CBC WITH DIFFERENTIAL/PLATELET
Basophils Absolute: 0 10*3/uL (ref 0.0–0.2)
Basos: 1 %
EOS (ABSOLUTE): 0.1 10*3/uL (ref 0.0–0.4)
Eos: 2 %
Hematocrit: 48 % (ref 37.5–51.0)
Hemoglobin: 16 g/dL (ref 13.0–17.7)
Immature Grans (Abs): 0 10*3/uL (ref 0.0–0.1)
Immature Granulocytes: 0 %
Lymphocytes Absolute: 1.8 10*3/uL (ref 0.7–3.1)
Lymphs: 27 %
MCH: 32.3 pg (ref 26.6–33.0)
MCHC: 33.3 g/dL (ref 31.5–35.7)
MCV: 97 fL (ref 79–97)
Monocytes Absolute: 0.6 10*3/uL (ref 0.1–0.9)
Monocytes: 9 %
Neutrophils Absolute: 4 10*3/uL (ref 1.4–7.0)
Neutrophils: 60 %
Platelets: 225 10*3/uL (ref 150–450)
RBC: 4.95 x10E6/uL (ref 4.14–5.80)
RDW: 12.7 % (ref 11.6–15.4)
WBC: 6.7 10*3/uL (ref 3.4–10.8)

## 2024-02-10 LAB — LIPID PANEL
Chol/HDL Ratio: 2.2 ratio (ref 0.0–5.0)
Cholesterol, Total: 107 mg/dL (ref 100–199)
HDL: 49 mg/dL (ref >39)
LDL Chol Calc (NIH): 39 mg/dL (ref 0–99)
Triglycerides: 99 mg/dL (ref 0–149)
VLDL Cholesterol Cal: 19 mg/dL (ref 5–40)

## 2024-02-10 LAB — COMPREHENSIVE METABOLIC PANEL WITH GFR
ALT: 28 IU/L (ref 0–44)
AST: 22 IU/L (ref 0–40)
Albumin: 4.5 g/dL (ref 3.9–4.9)
Alkaline Phosphatase: 68 IU/L (ref 44–121)
BUN/Creatinine Ratio: 14 (ref 10–24)
BUN: 14 mg/dL (ref 8–27)
Bilirubin Total: 0.6 mg/dL (ref 0.0–1.2)
CO2: 23 mmol/L (ref 20–29)
Calcium: 9.7 mg/dL (ref 8.6–10.2)
Chloride: 103 mmol/L (ref 96–106)
Creatinine, Ser: 1 mg/dL (ref 0.76–1.27)
Globulin, Total: 2.5 g/dL (ref 1.5–4.5)
Glucose: 145 mg/dL — ABNORMAL HIGH (ref 70–99)
Potassium: 4.8 mmol/L (ref 3.5–5.2)
Sodium: 141 mmol/L (ref 134–144)
Total Protein: 7 g/dL (ref 6.0–8.5)
eGFR: 84 mL/min/{1.73_m2} (ref >59)

## 2024-02-10 LAB — TSH: TSH: 3.77 u[IU]/mL (ref 0.450–4.500)

## 2024-02-10 LAB — HEMOGLOBIN A1C
Est. average glucose Bld gHb Est-mCnc: 177 mg/dL
Hgb A1c MFr Bld: 7.8 % — ABNORMAL HIGH (ref 4.8–5.6)

## 2024-02-12 ENCOUNTER — Ambulatory Visit (INDEPENDENT_AMBULATORY_CARE_PROVIDER_SITE_OTHER): Payer: PRIVATE HEALTH INSURANCE

## 2024-02-12 ENCOUNTER — Telehealth: Payer: Self-pay

## 2024-02-12 VITALS — BP 126/77 | HR 67 | Ht 72.0 in | Wt 234.2 lb

## 2024-02-12 DIAGNOSIS — F411 Generalized anxiety disorder: Secondary | ICD-10-CM

## 2024-02-12 DIAGNOSIS — E785 Hyperlipidemia, unspecified: Secondary | ICD-10-CM

## 2024-02-12 DIAGNOSIS — I152 Hypertension secondary to endocrine disorders: Secondary | ICD-10-CM

## 2024-02-12 DIAGNOSIS — E1159 Type 2 diabetes mellitus with other circulatory complications: Secondary | ICD-10-CM

## 2024-02-12 DIAGNOSIS — I2510 Atherosclerotic heart disease of native coronary artery without angina pectoris: Secondary | ICD-10-CM | POA: Diagnosis not present

## 2024-02-12 DIAGNOSIS — Z8673 Personal history of transient ischemic attack (TIA), and cerebral infarction without residual deficits: Secondary | ICD-10-CM

## 2024-02-12 DIAGNOSIS — M792 Neuralgia and neuritis, unspecified: Secondary | ICD-10-CM

## 2024-02-12 DIAGNOSIS — E1169 Type 2 diabetes mellitus with other specified complication: Secondary | ICD-10-CM | POA: Diagnosis not present

## 2024-02-12 DIAGNOSIS — Z7984 Long term (current) use of oral hypoglycemic drugs: Secondary | ICD-10-CM

## 2024-02-12 LAB — POCT GLYCOSYLATED HEMOGLOBIN (HGB A1C): Hemoglobin A1C: 7.6 % — AB (ref 4.0–5.6)

## 2024-02-12 MED ORDER — ROSUVASTATIN CALCIUM 20 MG PO TABS: 20.0000 mg | ORAL_TABLET | Freq: Every day | ORAL | 1 refills | Status: DC

## 2024-02-12 MED ORDER — EMPAGLIFLOZIN 10 MG PO TABS: 10.0000 mg | ORAL_TABLET | Freq: Every day | ORAL | 0 refills | Status: DC

## 2024-02-12 MED ORDER — METFORMIN HCL ER 500 MG PO TB24: 1000.0000 mg | ORAL_TABLET | Freq: Two times a day (BID) | ORAL | 3 refills | Status: DC

## 2024-02-12 NOTE — Assessment & Plan Note (Addendum)
 Last lipid panel: LDL 39, HDL 49, Trig 99. LDL well within normal limits. Counseled patient on the recommendation to start a statin medication given his history of TIA combined with diabetes, even in the absence of elevated cholesterol/LDL. Patient declined statin therapy at this time. Will revisit discussion at follow up.

## 2024-02-12 NOTE — Telephone Encounter (Signed)
 Pt called back. RN answered pt's questions. Pt verbalized understanding.

## 2024-02-12 NOTE — Assessment & Plan Note (Addendum)
 Counseled patient on the recommendation to start a statin medication given his history of TIA combined with diabetes, even in the absence of elevated cholesterol/LDL. Patient declined statin therapy at this time. Will revisit discussion at follow up.

## 2024-02-12 NOTE — Assessment & Plan Note (Signed)
 A1c goal of <7. Above goal today at 7.6 (increased from 6.5). Patient expressed strong desire to control with improved diet and increased exercise over the next 3 months instead of adding medications. Due to diarrhea from his Metformin , will replace with Metformin  XR 1000 mg BID. Plan to recheck A1c in 3 months. Eye exam UTD, foot exam UTD, UACR UTD.

## 2024-02-12 NOTE — Patient Instructions (Signed)
 It was nice to see you today!  As we discussed in clinic:   Tomorrow, replace your Metformin  1000 mg twice per day with Metformin  Extended Release 1000 mg twice per day. This is still 2 pills in the AM and 2 pills in the PM.   Continue losartan  for blood pressure.   Continue tracking calories and exercising daily. You are doing great!   I will plan to see you again in 3 months to reassess A1c.  If you have any problems before your next visit feel free to message me via MyChart (minor issues or questions) or call the office, otherwise you may reach out to schedule an office visit.  Thank you! Meryl Acosta, PA-C

## 2024-02-12 NOTE — Assessment & Plan Note (Signed)
Continue weight management efforts and maintaining good blood sugar balance.

## 2024-02-12 NOTE — Assessment & Plan Note (Signed)
 Improved, but sharp pain episodes still happening occasionally. Will continue to monitor. If worsens, can consider ABI and/or nerve conduction study.

## 2024-02-12 NOTE — Telephone Encounter (Signed)
 Patient called, left VM to return the call to the office regarding medication question.     Copied from CRM [id number]. Topic: Clinical - Medication Question >> Feb 12, 2024  2:48 PM Martinique E wrote: Reason for CRM: Patient called in regarding his metFORMIN  (GLUCOPHAGE -XR) 500 MG 24 hr tablet medication. Dose was increased and patient wants clarification on that medication.

## 2024-02-12 NOTE — Progress Notes (Signed)
 Established Patient Office Visit  Subjective   Patient ID: Nathan Kelley, male    DOB: [birth year]  Age: 63 y.o. MRN: [id number]  Chief Complaint  Patient presents with   Medical Management of Chronic Issues    HPI  SAMUAL BEALS is a 62 y.o. male presenting today for follow up of hypertension, hyperlipidemia, diabetes. Patient reports that he is overall doing and feeling well. Reports that he knows his A1c has increased which he attributes to recent travel and not following his routine exercise regimen, Reports a strong family history of type 2 diabetes and says that because of this, he really wants to keep his A1c under control so that he does not have to go on insulin.  Hypertension: Pt denies chest pain, SOB, dizziness, edema, syncope, fatigue or heart palpitations. Taking losartan , reports excellent compliance with treatment. Denies side effects. Reports that he is checking BP at home occasionally and it is at goal.   Hyperlipidemia:  Currently consuming a low-fat, low-carb diet.  The ASCVD Risk score (Arnett DK, et al., 2019) failed to calculate for the following reasons:   Risk score cannot be calculated because patient has a medical history suggesting prior/existing ASCVD. At his last visit with Daryll Epp suggested having a future conversation about initiating statin therapy due to his history of TIA and combined diabetes. Patient reports today that he is not interested in initiating statin therapy because he was started on Rosuvastatin  20 mg back in 2023 and he said it made him feel as if "he was having another mini stroke".   Diabetes: denies hypoglycemic events, wounds or sores that are not healing well, increased thirst or urination. Denies vision problems, eye exam completed at [facility]. Taking metformin  as prescribed but does endorse daily diarrhea that he has been controlling with Imodium.     ROS Per HPI.    Objective:     BP 126/77   Pulse 67   Ht 6'  (1.829 m)   Wt 234 lb 3.2 oz (106.2 kg)   SpO2 97%   BMI 31.76 kg/m    Physical Exam Constitutional:      General: He is not in acute distress.    Appearance: Normal appearance.  Cardiovascular:     Rate and Rhythm: Normal rate and regular rhythm.     Heart sounds: Normal heart sounds. No murmur heard.    No friction rub. No gallop.  Pulmonary:     Effort: Pulmonary effort is normal. No respiratory distress.     Breath sounds: Normal breath sounds.  Musculoskeletal:        General: No swelling.     Cervical back: Neck supple.  Skin:    General: Skin is warm and dry.  Neurological:     General: No focal deficit present.     Mental Status: He is alert.  Psychiatric:        Mood and Affect: Mood normal.        Behavior: Behavior normal.        Thought Content: Thought content normal.    Results for orders placed or performed in visit on 02/12/24  POCT HgB A1C  Result Value Ref Range   Hemoglobin A1C 7.6 (A) 4.0 - 5.6 %   HbA1c POC (<> result, manual entry)     HbA1c, POC (prediabetic range)     HbA1c, POC (controlled diabetic range)       The ASCVD Risk score (Arnett DK, et al.,  2019) failed to calculate for the following reasons:   Risk score cannot be calculated because patient has a medical history suggesting prior/existing ASCVD    Assessment & Plan:   Type 2 diabetes mellitus with other specified complication, without long-term current use of insulin (HCC) Assessment & Plan: A1c goal of <7. Above goal today at 7.6 (increased from 6.5). Patient expressed strong desire to control with improved diet and increased exercise over the next 3 months instead of adding medications. Due to diarrhea from his Metformin , will replace with Metformin  XR 1000 mg BID. Plan to recheck A1c in 3 months. Eye exam UTD, foot exam UTD, UACR UTD.  Orders: -     POCT glycosylated hemoglobin (Hb A1C)  Atherosclerotic cardiovascular disease -     POCT glycosylated hemoglobin (Hb  A1C)  Hypertension associated with diabetes (HCC) Assessment & Plan: BP goal <130/80.  Stable, at goal.  Continue losartan  100 mg daily. Encouraged to continue ambulatory BP monitoring.  Will continue to monitor.    Personal history of TIA (transient ischemic attack) Assessment & Plan: Counseled patient on the recommendation to start a statin medication given his history of TIA combined with diabetes, even in the absence of elevated cholesterol/LDL. Patient declined statin therapy at this time. Will revisit discussion at follow up.   Neuropathic pain of right foot Assessment & Plan: Improved, but sharp pain episodes still happening occasionally. Will continue to monitor. If worsens, can consider ABI and/or nerve conduction study.    Morbid obesity (HCC)- bmi 30+ with DM, HTN, h/o TIA Assessment & Plan: Continue weight management efforts and maintaining good blood sugar balance.    GAD (generalized anxiety disorder) Assessment & Plan: GAD-7 score of 0, stable. Continue prozac  20 mg daily. Will continue to monitor.    Hyperlipidemia associated with type 2 diabetes mellitus (HCC) Assessment & Plan: Last lipid panel: LDL 39, HDL 49, Trig 99. LDL well within normal limits. Counseled patient on the recommendation to start a statin medication given his history of TIA combined with diabetes, even in the absence of elevated cholesterol/LDL. Patient declined statin therapy at this time. Will revisit discussion at follow up.     Other orders -     metFORMIN  HCl ER; Take 2 tablets (1,000 mg total) by mouth 2 (two) times daily with a meal.  Dispense: 120 tablet; Refill: 3    Return in about 3 months (around 05/14/2024) for DM.    Odilia Bennett, PA-C

## 2024-02-12 NOTE — Assessment & Plan Note (Signed)
 BP goal <130/80.  Stable, at goal.  Continue losartan  100 mg daily. Encouraged to continue ambulatory BP monitoring.  Will continue to monitor.

## 2024-02-12 NOTE — Assessment & Plan Note (Signed)
GAD-7 score of 0, stable. Continue prozac 20 mg daily. Will continue to monitor.

## 2024-03-21 ENCOUNTER — Other Ambulatory Visit: Payer: Self-pay | Admitting: Family Medicine

## 2024-03-21 DIAGNOSIS — F411 Generalized anxiety disorder: Secondary | ICD-10-CM

## 2024-04-16 ENCOUNTER — Other Ambulatory Visit: Payer: Self-pay | Admitting: Family Medicine

## 2024-04-16 DIAGNOSIS — E1159 Type 2 diabetes mellitus with other circulatory complications: Secondary | ICD-10-CM

## 2024-05-15 ENCOUNTER — Ambulatory Visit: Payer: PRIVATE HEALTH INSURANCE

## 2024-06-12 ENCOUNTER — Other Ambulatory Visit: Payer: Self-pay

## 2024-06-13 ENCOUNTER — Ambulatory Visit: Payer: PRIVATE HEALTH INSURANCE

## 2024-07-10 ENCOUNTER — Ambulatory Visit: Payer: PRIVATE HEALTH INSURANCE

## 2024-08-06 ENCOUNTER — Ambulatory Visit: Payer: PRIVATE HEALTH INSURANCE

## 2024-08-22 ENCOUNTER — Telehealth: Payer: Self-pay

## 2024-08-22 NOTE — Telephone Encounter (Signed)
 Yes he can still get the flu shot but he needs to wait 2 weeks from when he feels better to get it

## 2024-08-22 NOTE — Telephone Encounter (Signed)
 Copied from CRM #[id number]. Topic: Clinical - Medical Advice >> Aug 22, 2024  8:56 AM Antony RAMAN wrote: Reason for CRM: has the flu but wants to know if its still beneficial to get a flu shot [phone]  Called and LVM; related message from provider.

## 2024-09-13 ENCOUNTER — Other Ambulatory Visit: Payer: Self-pay

## 2024-09-13 DIAGNOSIS — F411 Generalized anxiety disorder: Secondary | ICD-10-CM

## 2024-09-18 ENCOUNTER — Ambulatory Visit (INDEPENDENT_AMBULATORY_CARE_PROVIDER_SITE_OTHER): Payer: PRIVATE HEALTH INSURANCE

## 2024-09-18 VITALS — BP 136/67 | HR 71 | Temp 97.7°F | Ht 72.0 in | Wt 224.1 lb

## 2024-09-18 DIAGNOSIS — Z7984 Long term (current) use of oral hypoglycemic drugs: Secondary | ICD-10-CM | POA: Diagnosis not present

## 2024-09-18 DIAGNOSIS — E1169 Type 2 diabetes mellitus with other specified complication: Secondary | ICD-10-CM

## 2024-09-18 DIAGNOSIS — Z23 Encounter for immunization: Secondary | ICD-10-CM

## 2024-09-18 DIAGNOSIS — E785 Hyperlipidemia, unspecified: Secondary | ICD-10-CM

## 2024-09-18 DIAGNOSIS — I152 Hypertension secondary to endocrine disorders: Secondary | ICD-10-CM

## 2024-09-18 DIAGNOSIS — E1159 Type 2 diabetes mellitus with other circulatory complications: Secondary | ICD-10-CM

## 2024-09-18 LAB — POCT GLYCOSYLATED HEMOGLOBIN (HGB A1C): Hemoglobin A1C: 7.9 % — AB (ref 4.0–5.6)

## 2024-09-18 NOTE — Assessment & Plan Note (Signed)
 BP goal <130/80.  Stable, at goal.  Continue losartan  100 mg daily. Encouraged to continue ambulatory BP monitoring.  Will continue to monitor.

## 2024-09-18 NOTE — Progress Notes (Signed)
 "  Established Patient Office Visit  Subjective   Patient ID: Nathan Kelley, male    DOB: 1960-09-10  Age: 64 y.o. MRN: [id number]  Chief Complaint  Patient presents with   Medical Management of Chronic Issues    HPI  History of Present Illness   Nathan Kelley is a 64 year old male with type 2 diabetes who presents for follow-up of his diabetes management.  Glycemic control - A1c remains stable at 7.9, consistent with previous readings - Blood glucose levels range from 93 mg/dL (low) to 842 mg/dL after breakfast. He does check this every day  - Diabetes managed with lifestyle modifications and metformin  (two tablets in the morning and two at night)  Weight management - Weight loss of ten pounds since last visit - Current weight fluctuates between 219 and 222 pounds - Limited ability to exercise due to working two jobs, but plans to finish second job soon to facilitate further weight loss  Dietary modifications and gastrointestinal symptoms - Actively counts carbohydrates - Daily fiber supplement added to diet - Improved gastrointestinal symptoms with switch from IR Metformin  to XL Metformi       ROS Per HPI.    Objective:     BP 136/67   Pulse 71   Temp 97.7 F (36.5 C) (Oral)   Ht 6' (1.829 m)   Wt 224 lb 1.9 oz (101.7 kg)   SpO2 97%   BMI 30.40 kg/m    Physical Exam Constitutional:      General: He is not in acute distress.    Appearance: Normal appearance.  Cardiovascular:     Rate and Rhythm: Normal rate and regular rhythm.     Heart sounds: Normal heart sounds. No murmur heard.    No friction rub. No gallop.  Pulmonary:     Effort: Pulmonary effort is normal. No respiratory distress.     Breath sounds: Normal breath sounds.  Musculoskeletal:        General: No swelling.  Skin:    General: Skin is warm and dry.  Neurological:     General: No focal deficit present.     Mental Status: He is alert.  Psychiatric:        Mood and Affect: Mood  normal.        Behavior: Behavior normal.        Thought Content: Thought content normal.      Results for orders placed or performed in visit on 09/18/24  POCT glycosylated hemoglobin (Hb A1C)  Result Value Ref Range   Hemoglobin A1C 7.9 (A) 4.0 - 5.6 %   HbA1c POC (<> result, manual entry)     HbA1c, POC (prediabetic range)     HbA1c, POC (controlled diabetic range)        The ASCVD Risk score (Arnett DK, et al., 2019) failed to calculate for the following reasons:   Risk score cannot be calculated because patient has a medical history suggesting prior/existing ASCVD   * - Cholesterol units were assumed    Assessment & Plan:   Hypertension associated with diabetes (HCC) Assessment & Plan: BP goal <130/80. Stable, at goal. Continue losartan  100 mg daily. Encouraged to continue ambulatory BP monitoring. Will continue to monitor.   Orders: -     POCT glycosylated hemoglobin (Hb A1C) -     TSH; Future -     CBC with Differential/Platelet; Future  Encounter for vaccination -     Flu vaccine HIGH DOSE PF(Fluzone  Trivalent)  Type 2 diabetes mellitus with other specified complication, without long-term current use of insulin (HCC) Assessment & Plan: A1c goal of <7. Above goal today at 7.9 (previous 7.6). SABRA Patient expressed strong desire to control with improved diet and increased exercise over the next 3 months instead of adding medications.  He has lost 10 lbs recently with diet and exercise. I highly suggested that he consider GLP1 therapy versus Jardiance  to lower the A1c further.  Patient declined additional therapy today. Risks and benefits of that decision were discussed. Diarrhea improved significantly with switch from Metformin  IR to Metformin  XR. Will update UACR with labs. Will update foot exam and request eye exam records. Follow up in 3 months to reassess.   Orders: -     Microalbumin / creatinine urine ratio; Future  Hyperlipidemia associated with type 2 diabetes  mellitus (HCC) Assessment & Plan: Last lipid panel: LDL 39, HDL 49, Trig 99. LDL well within normal limits. Counseled patient on the recommendation to start a statin medication given his history of TIA combined with diabetes, even in the absence of elevated cholesterol/LDL. Patient declined statin therapy at this time.   Orders: -     Lipid panel; Future -     Comprehensive metabolic panel with GFR; Future -     CBC with Differential/Platelet; Future    Return in about 3 months (around 12/17/2024) for DM.    Saddie JULIANNA Sacks, PA-C "

## 2024-09-18 NOTE — Patient Instructions (Signed)
 VISIT SUMMARY: Today we reviewed your diabetes management, weight loss progress, dietary changes, and recent upper respiratory symptoms. You received a flu shot and we discussed future plans for your diabetes treatment.  YOUR PLAN: TYPE 2 DIABETES MELLITUS WITH HYPERTENSION: Your A1c level is 7.9, which is above the target. Your blood glucose levels range from 93 to 157 mg/dL. You have lost 10 pounds since your last visit. -Continue taking metformin , 2 tablets in the morning and 2 tablets at night. -You have scheduled fasting blood work next week for cholesterol and other labs. -We will recheck your A1c in 3 months. -We will discuss the possibility of adding Jardiance  or Ozempic to your treatment if your A1c remains elevated. I encourage you to read about these medications and call your insurance for price estimates.  -Continue with your weight loss efforts and lifestyle modifications.  GENERAL HEALTH MAINTENANCE: You have not received your flu shot or pneumonia vaccine this year. -You received your flu shot today. -We will discuss the pneumonia vaccination at your next visit.  If you have any problems before your next visit feel free to message me via MyChart (minor issues or questions) or call the office, otherwise you may reach out to schedule an office visit.  Thank you! Saddie Sacks, PA-C

## 2024-09-18 NOTE — Assessment & Plan Note (Signed)
 Last lipid panel: LDL 39, HDL 49, Trig 99. LDL well within normal limits. Counseled patient on the recommendation to start a statin medication given his history of TIA combined with diabetes, even in the absence of elevated cholesterol/LDL. Patient declined statin therapy at this time.

## 2024-09-18 NOTE — Assessment & Plan Note (Addendum)
 A1c goal of <7. Above goal today at 7.9 (previous 7.6). Nathan Kelley Patient expressed strong desire to control with improved diet and increased exercise over the next 3 months instead of adding medications.  He has lost 10 lbs recently with diet and exercise. I highly suggested that he consider GLP1 therapy versus Jardiance  to lower the A1c further.  Patient declined additional therapy today. Risks and benefits of that decision were discussed. Diarrhea improved significantly with switch from Metformin  IR to Metformin  XR. Will update UACR with labs. Will update foot exam and request eye exam records. Follow up in 3 months to reassess.

## 2024-09-30 ENCOUNTER — Other Ambulatory Visit: Payer: PRIVATE HEALTH INSURANCE

## 2024-10-03 ENCOUNTER — Other Ambulatory Visit: Payer: PRIVATE HEALTH INSURANCE

## 2024-10-03 DIAGNOSIS — E1169 Type 2 diabetes mellitus with other specified complication: Secondary | ICD-10-CM

## 2024-10-03 DIAGNOSIS — E1159 Type 2 diabetes mellitus with other circulatory complications: Secondary | ICD-10-CM

## 2024-10-04 LAB — CBC WITH DIFFERENTIAL/PLATELET
Basophils Absolute: 0 10*3/uL (ref 0.0–0.2)
Basos: 1 %
EOS (ABSOLUTE): 0.2 10*3/uL (ref 0.0–0.4)
Eos: 2 %
Hematocrit: 47.2 % (ref 37.5–51.0)
Hemoglobin: 16.6 g/dL (ref 13.0–17.7)
Immature Grans (Abs): 0 10*3/uL (ref 0.0–0.1)
Immature Granulocytes: 0 %
Lymphocytes Absolute: 1.8 10*3/uL (ref 0.7–3.1)
Lymphs: 26 %
MCH: 32.6 pg (ref 26.6–33.0)
MCHC: 35.2 g/dL (ref 31.5–35.7)
MCV: 93 fL (ref 79–97)
Monocytes Absolute: 0.6 10*3/uL (ref 0.1–0.9)
Monocytes: 9 %
Neutrophils Absolute: 4.2 10*3/uL (ref 1.4–7.0)
Neutrophils: 61 %
Platelets: 239 10*3/uL (ref 150–450)
RBC: 5.09 x10E6/uL (ref 4.14–5.80)
RDW: 11.8 % (ref 11.6–15.4)
WBC: 6.9 10*3/uL (ref 3.4–10.8)

## 2024-10-04 LAB — MICROALBUMIN / CREATININE URINE RATIO
Creatinine, Urine: 131.6 mg/dL
Microalb/Creat Ratio: 4 mg/g{creat} (ref 0–29)
Microalbumin, Urine: 5.7 ug/mL

## 2024-10-04 LAB — COMPREHENSIVE METABOLIC PANEL WITH GFR
ALT: 29 [IU]/L (ref 0–44)
AST: 23 [IU]/L (ref 0–40)
Albumin: 4.7 g/dL (ref 3.9–4.9)
Alkaline Phosphatase: 73 [IU]/L (ref 47–123)
BUN/Creatinine Ratio: 16 (ref 10–24)
BUN: 13 mg/dL (ref 8–27)
Bilirubin Total: 0.6 mg/dL (ref 0.0–1.2)
CO2: 23 mmol/L (ref 20–29)
Calcium: 9.6 mg/dL (ref 8.6–10.2)
Chloride: 102 mmol/L (ref 96–106)
Creatinine, Ser: 0.83 mg/dL (ref 0.76–1.27)
Globulin, Total: 2.3 g/dL (ref 1.5–4.5)
Glucose: 222 mg/dL — ABNORMAL HIGH (ref 70–99)
Potassium: 4.8 mmol/L (ref 3.5–5.2)
Sodium: 140 mmol/L (ref 134–144)
Total Protein: 7 g/dL (ref 6.0–8.5)
eGFR: 97 mL/min/{1.73_m2}

## 2024-10-04 LAB — LIPID PANEL
Chol/HDL Ratio: 2.5 ratio (ref 0.0–5.0)
Cholesterol, Total: 123 mg/dL (ref 100–199)
HDL: 49 mg/dL
LDL Chol Calc (NIH): 53 mg/dL (ref 0–99)
Triglycerides: 118 mg/dL (ref 0–149)
VLDL Cholesterol Cal: 21 mg/dL (ref 5–40)

## 2024-10-04 LAB — TSH: TSH: 3.38 u[IU]/mL (ref 0.450–4.500)

## 2024-10-08 ENCOUNTER — Ambulatory Visit: Payer: Self-pay

## 2024-12-23 ENCOUNTER — Ambulatory Visit: Payer: PRIVATE HEALTH INSURANCE
